# Patient Record
Sex: Female | Born: 1935 | Race: White | Hispanic: No | Smoking: Never smoker
Health system: Southern US, Community
[De-identification: ages and names within clinical notes are randomized; demographics above are authoritative.]

## PROBLEM LIST (undated history)

## (undated) DIAGNOSIS — E119 Type 2 diabetes mellitus without complications: Secondary | ICD-10-CM

## (undated) DIAGNOSIS — M810 Age-related osteoporosis without current pathological fracture: Secondary | ICD-10-CM

## (undated) DIAGNOSIS — R251 Tremor, unspecified: Secondary | ICD-10-CM

## (undated) DIAGNOSIS — I639 Cerebral infarction, unspecified: Secondary | ICD-10-CM

## (undated) DIAGNOSIS — R079 Chest pain, unspecified: Secondary | ICD-10-CM

## (undated) DIAGNOSIS — G2581 Restless legs syndrome: Secondary | ICD-10-CM

## (undated) DIAGNOSIS — S72009A Fracture of unspecified part of neck of unspecified femur, initial encounter for closed fracture: Secondary | ICD-10-CM

## (undated) DIAGNOSIS — I10 Essential (primary) hypertension: Secondary | ICD-10-CM

## (undated) DIAGNOSIS — D62 Acute posthemorrhagic anemia: Secondary | ICD-10-CM

## (undated) DIAGNOSIS — F32A Depression, unspecified: Secondary | ICD-10-CM

## (undated) DIAGNOSIS — F329 Major depressive disorder, single episode, unspecified: Secondary | ICD-10-CM

## (undated) DIAGNOSIS — K922 Gastrointestinal hemorrhage, unspecified: Secondary | ICD-10-CM

## (undated) DIAGNOSIS — D649 Anemia, unspecified: Secondary | ICD-10-CM

## (undated) DIAGNOSIS — G629 Polyneuropathy, unspecified: Secondary | ICD-10-CM

## (undated) HISTORY — DX: Gastrointestinal hemorrhage, unspecified: K92.2

## (undated) HISTORY — PX: OTHER SURGICAL HISTORY: SHX169

## (undated) HISTORY — DX: Acute posthemorrhagic anemia: D62

## (undated) HISTORY — DX: Anemia, unspecified: D64.9

## (undated) HISTORY — PX: ABDOMINAL HYSTERECTOMY: SHX81

## (undated) HISTORY — PX: LEG SURGERY: SHX1003

## (undated) HISTORY — DX: Chest pain, unspecified: R07.9

## (undated) SURGERY — Surgical Case
Anesthesia: *Unknown

---

## 2001-05-21 ENCOUNTER — Other Ambulatory Visit: Admission: RE | Admit: 2001-05-21 | Discharge: 2001-05-21 | Payer: Self-pay | Admitting: Family Medicine

## 2001-05-30 ENCOUNTER — Ambulatory Visit (HOSPITAL_COMMUNITY): Admission: RE | Admit: 2001-05-30 | Discharge: 2001-05-30 | Payer: Self-pay | Admitting: Family Medicine

## 2001-05-30 ENCOUNTER — Encounter: Payer: Self-pay | Admitting: Family Medicine

## 2001-05-30 ENCOUNTER — Encounter: Admission: RE | Admit: 2001-05-30 | Discharge: 2001-05-30 | Payer: Self-pay | Admitting: Family Medicine

## 2001-06-10 ENCOUNTER — Encounter: Admission: RE | Admit: 2001-06-10 | Discharge: 2001-06-10 | Payer: Self-pay | Admitting: Family Medicine

## 2001-06-10 ENCOUNTER — Encounter: Payer: Self-pay | Admitting: Family Medicine

## 2005-10-09 ENCOUNTER — Ambulatory Visit: Payer: Self-pay | Admitting: Physician Assistant

## 2006-10-29 ENCOUNTER — Ambulatory Visit: Payer: Self-pay | Admitting: Physician Assistant

## 2008-04-13 ENCOUNTER — Ambulatory Visit: Payer: Self-pay | Admitting: Family Medicine

## 2008-09-18 ENCOUNTER — Emergency Department: Payer: Self-pay | Admitting: Unknown Physician Specialty

## 2009-09-27 ENCOUNTER — Ambulatory Visit: Payer: Self-pay | Admitting: Physician Assistant

## 2010-10-03 ENCOUNTER — Ambulatory Visit: Payer: Self-pay | Admitting: Physician Assistant

## 2011-08-22 ENCOUNTER — Emergency Department: Payer: Self-pay | Admitting: Emergency Medicine

## 2011-11-15 ENCOUNTER — Ambulatory Visit: Payer: Self-pay | Admitting: Physician Assistant

## 2012-06-27 ENCOUNTER — Inpatient Hospital Stay: Payer: Self-pay | Admitting: Specialist

## 2012-06-27 ENCOUNTER — Ambulatory Visit: Payer: Self-pay | Admitting: Orthopedic Surgery

## 2012-06-27 LAB — CBC WITH DIFFERENTIAL/PLATELET
Basophil #: 0.3 10*3/uL — ABNORMAL HIGH (ref 0.0–0.1)
Basophil %: 4 %
Eosinophil #: 0.1 10*3/uL (ref 0.0–0.7)
Eosinophil %: 1.9 %
HCT: 39 % (ref 35.0–47.0)
HGB: 13.6 g/dL (ref 12.0–16.0)
Lymphocyte #: 1.4 10*3/uL (ref 1.0–3.6)
Lymphocyte %: 19.4 %
MCH: 31.3 pg (ref 26.0–34.0)
MCHC: 34.8 g/dL (ref 32.0–36.0)
MCV: 90 fL (ref 80–100)
Monocyte #: 0.4 x10 3/mm (ref 0.2–0.9)
Monocyte %: 5.4 %
Neutrophil #: 4.9 10*3/uL (ref 1.4–6.5)
Neutrophil %: 69.3 %
Platelet: 204 10*3/uL (ref 150–440)
RBC: 4.34 10*6/uL (ref 3.80–5.20)
RDW: 13.4 % (ref 11.5–14.5)
WBC: 7 10*3/uL (ref 3.6–11.0)

## 2012-06-27 LAB — COMPREHENSIVE METABOLIC PANEL
Albumin: 4.3 g/dL (ref 3.4–5.0)
Alkaline Phosphatase: 100 U/L (ref 50–136)
Anion Gap: 5 — ABNORMAL LOW (ref 7–16)
BUN: 17 mg/dL (ref 7–18)
Bilirubin,Total: 0.5 mg/dL (ref 0.2–1.0)
Calcium, Total: 9.5 mg/dL (ref 8.5–10.1)
Chloride: 105 mmol/L (ref 98–107)
Co2: 28 mmol/L (ref 21–32)
Creatinine: 0.88 mg/dL (ref 0.60–1.30)
EGFR (African American): >60
EGFR (Non-African Amer.): >60
Glucose: 153 mg/dL — ABNORMAL HIGH (ref 65–99)
Osmolality: 280 (ref 275–301)
Potassium: 4.6 mmol/L (ref 3.5–5.1)
SGOT(AST): 28 U/L (ref 15–37)
SGPT (ALT): 16 U/L (ref 12–78)
Sodium: 138 mmol/L (ref 136–145)
Total Protein: 8.1 g/dL (ref 6.4–8.2)

## 2012-06-27 LAB — PROTIME-INR
INR: 1
Prothrombin Time: 13.8 secs (ref 11.5–14.7)

## 2012-06-27 LAB — APTT: Activated PTT: <23 secs — ABNORMAL LOW (ref 23.6–35.9)

## 2012-07-01 ENCOUNTER — Encounter: Payer: Self-pay | Admitting: Internal Medicine

## 2013-05-30 ENCOUNTER — Emergency Department: Payer: Self-pay | Admitting: Emergency Medicine

## 2013-05-30 LAB — COMPREHENSIVE METABOLIC PANEL
Albumin: 4.2 g/dL (ref 3.4–5.0)
Alkaline Phosphatase: 89 U/L (ref 50–136)
Anion Gap: 10 (ref 7–16)
BUN: 14 mg/dL (ref 7–18)
Bilirubin,Total: 0.9 mg/dL (ref 0.2–1.0)
Calcium, Total: 10.2 mg/dL — ABNORMAL HIGH (ref 8.5–10.1)
Chloride: 103 mmol/L (ref 98–107)
Co2: 21 mmol/L (ref 21–32)
Creatinine: 1.23 mg/dL (ref 0.60–1.30)
EGFR (African American): 47 — ABNORMAL LOW
EGFR (Non-African Amer.): 41 — ABNORMAL LOW
Glucose: 104 mg/dL — ABNORMAL HIGH (ref 65–99)
Osmolality: 269 (ref 275–301)
Potassium: 3.8 mmol/L (ref 3.5–5.1)
SGOT(AST): 27 U/L (ref 15–37)
SGPT (ALT): 18 U/L (ref 12–78)
Sodium: 134 mmol/L — ABNORMAL LOW (ref 136–145)
Total Protein: 8.1 g/dL (ref 6.4–8.2)

## 2013-05-30 LAB — CBC
HCT: 41 % (ref 35.0–47.0)
HGB: 14.6 g/dL (ref 12.0–16.0)
MCH: 31.5 pg (ref 26.0–34.0)
MCHC: 35.5 g/dL (ref 32.0–36.0)
MCV: 89 fL (ref 80–100)
Platelet: 230 10*3/uL (ref 150–440)
RBC: 4.62 10*6/uL (ref 3.80–5.20)
RDW: 13.4 % (ref 11.5–14.5)
WBC: 7.3 10*3/uL (ref 3.6–11.0)

## 2013-05-30 LAB — URINALYSIS, COMPLETE
Bilirubin,UR: NEGATIVE
Blood: NEGATIVE
Glucose,UR: NEGATIVE mg/dL (ref 0–75)
Ketone: NEGATIVE
Nitrite: NEGATIVE
Ph: 7 (ref 4.5–8.0)
Protein: 30
RBC,UR: 11 /HPF (ref 0–5)
Specific Gravity: 1.016 (ref 1.003–1.030)
Squamous Epithelial: 6
WBC UR: 8 /HPF (ref 0–5)

## 2013-05-30 LAB — TROPONIN I: Troponin-I: <0.02 ng/mL

## 2013-06-01 ENCOUNTER — Emergency Department: Payer: Self-pay | Admitting: Emergency Medicine

## 2013-06-01 LAB — COMPREHENSIVE METABOLIC PANEL
Alkaline Phosphatase: 98 U/L (ref 50–136)
Bilirubin,Total: 1.3 mg/dL — ABNORMAL HIGH (ref 0.2–1.0)
Chloride: 102 mmol/L (ref 98–107)
Co2: 21 mmol/L (ref 21–32)
Creatinine: 1.33 mg/dL — ABNORMAL HIGH (ref 0.60–1.30)
EGFR (Non-African Amer.): 37 — ABNORMAL LOW
Glucose: 200 mg/dL — ABNORMAL HIGH (ref 65–99)
Osmolality: 277 (ref 275–301)
Potassium: 3.7 mmol/L (ref 3.5–5.1)
SGOT(AST): 119 U/L — ABNORMAL HIGH (ref 15–37)
SGPT (ALT): 45 U/L (ref 12–78)
Sodium: 133 mmol/L — ABNORMAL LOW (ref 136–145)

## 2013-06-01 LAB — CBC
HCT: 43.1 % (ref 35.0–47.0)
MCH: 31.1 pg (ref 26.0–34.0)
MCHC: 35.3 g/dL (ref 32.0–36.0)
MCV: 88 fL (ref 80–100)
RDW: 13.7 % (ref 11.5–14.5)
WBC: 9 10*3/uL (ref 3.6–11.0)

## 2013-06-01 LAB — URINALYSIS, COMPLETE
Bilirubin,UR: NEGATIVE
Glucose,UR: NEGATIVE mg/dL (ref 0–75)
Nitrite: NEGATIVE
Ph: 5 (ref 4.5–8.0)
Protein: 100
RBC,UR: 6 /HPF (ref 0–5)
Squamous Epithelial: 17

## 2013-06-01 LAB — URINE CULTURE

## 2014-09-08 DIAGNOSIS — I69359 Hemiplegia and hemiparesis following cerebral infarction affecting unspecified side: Secondary | ICD-10-CM | POA: Diagnosis not present

## 2014-09-08 DIAGNOSIS — F329 Major depressive disorder, single episode, unspecified: Secondary | ICD-10-CM | POA: Diagnosis not present

## 2014-09-08 DIAGNOSIS — R5383 Other fatigue: Secondary | ICD-10-CM | POA: Diagnosis not present

## 2014-09-08 DIAGNOSIS — E119 Type 2 diabetes mellitus without complications: Secondary | ICD-10-CM | POA: Diagnosis not present

## 2014-09-08 DIAGNOSIS — I10 Essential (primary) hypertension: Secondary | ICD-10-CM | POA: Diagnosis not present

## 2014-09-08 DIAGNOSIS — M79604 Pain in right leg: Secondary | ICD-10-CM | POA: Diagnosis not present

## 2014-09-08 DIAGNOSIS — G629 Polyneuropathy, unspecified: Secondary | ICD-10-CM | POA: Diagnosis not present

## 2014-09-08 DIAGNOSIS — E538 Deficiency of other specified B group vitamins: Secondary | ICD-10-CM | POA: Diagnosis not present

## 2014-09-08 DIAGNOSIS — G4700 Insomnia, unspecified: Secondary | ICD-10-CM | POA: Diagnosis not present

## 2014-09-15 DIAGNOSIS — M79609 Pain in unspecified limb: Secondary | ICD-10-CM | POA: Diagnosis not present

## 2014-09-21 DIAGNOSIS — N390 Urinary tract infection, site not specified: Secondary | ICD-10-CM | POA: Diagnosis not present

## 2014-09-21 DIAGNOSIS — M79604 Pain in right leg: Secondary | ICD-10-CM | POA: Diagnosis not present

## 2014-09-21 DIAGNOSIS — E119 Type 2 diabetes mellitus without complications: Secondary | ICD-10-CM | POA: Diagnosis not present

## 2014-09-21 DIAGNOSIS — G629 Polyneuropathy, unspecified: Secondary | ICD-10-CM | POA: Diagnosis not present

## 2014-09-21 DIAGNOSIS — Z9181 History of falling: Secondary | ICD-10-CM | POA: Diagnosis not present

## 2014-09-21 DIAGNOSIS — Z1389 Encounter for screening for other disorder: Secondary | ICD-10-CM | POA: Diagnosis not present

## 2014-09-21 DIAGNOSIS — F329 Major depressive disorder, single episode, unspecified: Secondary | ICD-10-CM | POA: Diagnosis not present

## 2014-10-20 DIAGNOSIS — G629 Polyneuropathy, unspecified: Secondary | ICD-10-CM | POA: Diagnosis not present

## 2014-10-20 DIAGNOSIS — F329 Major depressive disorder, single episode, unspecified: Secondary | ICD-10-CM | POA: Diagnosis not present

## 2014-10-20 DIAGNOSIS — M8580 Other specified disorders of bone density and structure, unspecified site: Secondary | ICD-10-CM | POA: Diagnosis not present

## 2014-10-20 DIAGNOSIS — H6120 Impacted cerumen, unspecified ear: Secondary | ICD-10-CM | POA: Diagnosis not present

## 2014-10-20 DIAGNOSIS — I739 Peripheral vascular disease, unspecified: Secondary | ICD-10-CM | POA: Diagnosis not present

## 2014-10-20 DIAGNOSIS — Z6822 Body mass index (BMI) 22.0-22.9, adult: Secondary | ICD-10-CM | POA: Diagnosis not present

## 2014-11-01 DIAGNOSIS — I639 Cerebral infarction, unspecified: Secondary | ICD-10-CM | POA: Diagnosis not present

## 2014-11-01 DIAGNOSIS — I10 Essential (primary) hypertension: Secondary | ICD-10-CM | POA: Diagnosis not present

## 2014-11-01 DIAGNOSIS — I70212 Atherosclerosis of native arteries of extremities with intermittent claudication, left leg: Secondary | ICD-10-CM | POA: Diagnosis not present

## 2014-11-01 DIAGNOSIS — E785 Hyperlipidemia, unspecified: Secondary | ICD-10-CM | POA: Diagnosis not present

## 2014-11-01 DIAGNOSIS — M79609 Pain in unspecified limb: Secondary | ICD-10-CM | POA: Diagnosis not present

## 2014-11-01 DIAGNOSIS — E119 Type 2 diabetes mellitus without complications: Secondary | ICD-10-CM | POA: Diagnosis not present

## 2014-11-03 ENCOUNTER — Ambulatory Visit
Admit: 2014-11-03 | Disposition: A | Discharge status: Home / Self Care | Payer: Self-pay | Attending: Physician Assistant | Admitting: Physician Assistant

## 2014-11-03 DIAGNOSIS — M810 Age-related osteoporosis without current pathological fracture: Secondary | ICD-10-CM | POA: Diagnosis not present

## 2014-11-04 DIAGNOSIS — I639 Cerebral infarction, unspecified: Secondary | ICD-10-CM | POA: Diagnosis not present

## 2014-11-04 DIAGNOSIS — I771 Stricture of artery: Secondary | ICD-10-CM | POA: Diagnosis not present

## 2014-11-04 DIAGNOSIS — I70221 Atherosclerosis of native arteries of extremities with rest pain, right leg: Secondary | ICD-10-CM | POA: Diagnosis not present

## 2014-11-04 DIAGNOSIS — E119 Type 2 diabetes mellitus without complications: Secondary | ICD-10-CM | POA: Diagnosis not present

## 2014-11-04 DIAGNOSIS — E785 Hyperlipidemia, unspecified: Secondary | ICD-10-CM | POA: Diagnosis not present

## 2014-11-04 DIAGNOSIS — M79609 Pain in unspecified limb: Secondary | ICD-10-CM | POA: Diagnosis not present

## 2014-11-04 DIAGNOSIS — I70212 Atherosclerosis of native arteries of extremities with intermittent claudication, left leg: Secondary | ICD-10-CM | POA: Diagnosis not present

## 2014-11-04 DIAGNOSIS — I10 Essential (primary) hypertension: Secondary | ICD-10-CM | POA: Diagnosis not present
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
## 2015-01-26 DIAGNOSIS — Z9181 History of falling: Secondary | ICD-10-CM | POA: Diagnosis not present

## 2015-01-26 DIAGNOSIS — E119 Type 2 diabetes mellitus without complications: Secondary | ICD-10-CM | POA: Diagnosis not present

## 2015-01-26 DIAGNOSIS — I69359 Hemiplegia and hemiparesis following cerebral infarction affecting unspecified side: Secondary | ICD-10-CM | POA: Diagnosis not present

## 2015-01-26 DIAGNOSIS — Z79899 Other long term (current) drug therapy: Secondary | ICD-10-CM | POA: Diagnosis not present

## 2015-01-26 DIAGNOSIS — Z139 Encounter for screening, unspecified: Secondary | ICD-10-CM | POA: Diagnosis not present

## 2015-01-26 DIAGNOSIS — G250 Essential tremor: Secondary | ICD-10-CM | POA: Diagnosis not present

## 2015-01-26 DIAGNOSIS — M79604 Pain in right leg: Secondary | ICD-10-CM | POA: Diagnosis not present

## 2015-01-26 DIAGNOSIS — E780 Pure hypercholesterolemia: Secondary | ICD-10-CM | POA: Diagnosis not present

## 2015-01-26 DIAGNOSIS — Z6822 Body mass index (BMI) 22.0-22.9, adult: Secondary | ICD-10-CM | POA: Diagnosis not present

## 2015-04-28 DIAGNOSIS — I10 Essential (primary) hypertension: Secondary | ICD-10-CM | POA: Diagnosis not present

## 2015-04-28 DIAGNOSIS — G2581 Restless legs syndrome: Secondary | ICD-10-CM | POA: Diagnosis not present

## 2015-04-28 DIAGNOSIS — M79604 Pain in right leg: Secondary | ICD-10-CM | POA: Diagnosis not present

## 2015-04-28 DIAGNOSIS — G4700 Insomnia, unspecified: Secondary | ICD-10-CM | POA: Diagnosis not present

## 2015-04-28 DIAGNOSIS — R413 Other amnesia: Secondary | ICD-10-CM | POA: Diagnosis not present

## 2015-04-28 DIAGNOSIS — E119 Type 2 diabetes mellitus without complications: Secondary | ICD-10-CM | POA: Diagnosis not present

## 2015-04-28 DIAGNOSIS — G250 Essential tremor: Secondary | ICD-10-CM | POA: Diagnosis not present

## 2015-08-02 DIAGNOSIS — I10 Essential (primary) hypertension: Secondary | ICD-10-CM | POA: Diagnosis not present

## 2015-08-02 DIAGNOSIS — Z23 Encounter for immunization: Secondary | ICD-10-CM | POA: Diagnosis not present

## 2015-08-02 DIAGNOSIS — I69359 Hemiplegia and hemiparesis following cerebral infarction affecting unspecified side: Secondary | ICD-10-CM | POA: Diagnosis not present

## 2015-08-02 DIAGNOSIS — M79604 Pain in right leg: Secondary | ICD-10-CM | POA: Diagnosis not present

## 2015-08-02 DIAGNOSIS — E1165 Type 2 diabetes mellitus with hyperglycemia: Secondary | ICD-10-CM | POA: Diagnosis not present

## 2015-08-02 DIAGNOSIS — G250 Essential tremor: Secondary | ICD-10-CM | POA: Diagnosis not present

## 2015-08-02 DIAGNOSIS — Z6822 Body mass index (BMI) 22.0-22.9, adult: Secondary | ICD-10-CM | POA: Diagnosis not present

## 2015-09-20 DIAGNOSIS — G250 Essential tremor: Secondary | ICD-10-CM | POA: Diagnosis not present

## 2015-10-20 ENCOUNTER — Inpatient Hospital Stay
Admission: EM | Admit: 2015-10-20 | Discharge: 2015-10-24 | DRG: 481 | Disposition: A | Discharge status: Skilled Nursing Facility | Payer: Commercial Managed Care - HMO | Attending: Specialist | Admitting: Specialist

## 2015-10-20 ENCOUNTER — Encounter: Payer: Self-pay | Admitting: Emergency Medicine

## 2015-10-20 ENCOUNTER — Emergency Department: Payer: Commercial Managed Care - HMO

## 2015-10-20 DIAGNOSIS — R410 Disorientation, unspecified: Secondary | ICD-10-CM | POA: Diagnosis not present

## 2015-10-20 DIAGNOSIS — Z7983 Long term (current) use of bisphosphonates: Secondary | ICD-10-CM | POA: Diagnosis not present

## 2015-10-20 DIAGNOSIS — Z4789 Encounter for other orthopedic aftercare: Secondary | ICD-10-CM | POA: Diagnosis not present

## 2015-10-20 DIAGNOSIS — Z8673 Personal history of transient ischemic attack (TIA), and cerebral infarction without residual deficits: Secondary | ICD-10-CM

## 2015-10-20 DIAGNOSIS — Z9181 History of falling: Secondary | ICD-10-CM | POA: Diagnosis not present

## 2015-10-20 DIAGNOSIS — N179 Acute kidney failure, unspecified: Secondary | ICD-10-CM | POA: Diagnosis not present

## 2015-10-20 DIAGNOSIS — G629 Polyneuropathy, unspecified: Secondary | ICD-10-CM | POA: Diagnosis present

## 2015-10-20 DIAGNOSIS — Z7984 Long term (current) use of oral hypoglycemic drugs: Secondary | ICD-10-CM | POA: Diagnosis not present

## 2015-10-20 DIAGNOSIS — T402X5A Adverse effect of other opioids, initial encounter: Secondary | ICD-10-CM | POA: Diagnosis not present

## 2015-10-20 DIAGNOSIS — S72145D Nondisplaced intertrochanteric fracture of left femur, subsequent encounter for closed fracture with routine healing: Secondary | ICD-10-CM | POA: Diagnosis not present

## 2015-10-20 DIAGNOSIS — M6281 Muscle weakness (generalized): Secondary | ICD-10-CM | POA: Diagnosis not present

## 2015-10-20 DIAGNOSIS — W06XXXA Fall from bed, initial encounter: Secondary | ICD-10-CM | POA: Diagnosis present

## 2015-10-20 DIAGNOSIS — S72142A Displaced intertrochanteric fracture of left femur, initial encounter for closed fracture: Principal | ICD-10-CM

## 2015-10-20 DIAGNOSIS — Z79899 Other long term (current) drug therapy: Secondary | ICD-10-CM

## 2015-10-20 DIAGNOSIS — Y92003 Bedroom of unspecified non-institutional (private) residence as the place of occurrence of the external cause: Secondary | ICD-10-CM | POA: Diagnosis not present

## 2015-10-20 DIAGNOSIS — E1122 Type 2 diabetes mellitus with diabetic chronic kidney disease: Secondary | ICD-10-CM | POA: Diagnosis not present

## 2015-10-20 DIAGNOSIS — G2581 Restless legs syndrome: Secondary | ICD-10-CM | POA: Diagnosis present

## 2015-10-20 DIAGNOSIS — N189 Chronic kidney disease, unspecified: Secondary | ICD-10-CM | POA: Diagnosis not present

## 2015-10-20 DIAGNOSIS — D649 Anemia, unspecified: Secondary | ICD-10-CM | POA: Diagnosis not present

## 2015-10-20 DIAGNOSIS — E871 Hypo-osmolality and hyponatremia: Secondary | ICD-10-CM | POA: Diagnosis not present

## 2015-10-20 DIAGNOSIS — Y92230 Patient room in hospital as the place of occurrence of the external cause: Secondary | ICD-10-CM | POA: Diagnosis not present

## 2015-10-20 DIAGNOSIS — Z96642 Presence of left artificial hip joint: Secondary | ICD-10-CM | POA: Diagnosis present

## 2015-10-20 DIAGNOSIS — Z419 Encounter for procedure for purposes other than remedying health state, unspecified: Secondary | ICD-10-CM

## 2015-10-20 DIAGNOSIS — I129 Hypertensive chronic kidney disease with stage 1 through stage 4 chronic kidney disease, or unspecified chronic kidney disease: Secondary | ICD-10-CM | POA: Diagnosis present

## 2015-10-20 DIAGNOSIS — F329 Major depressive disorder, single episode, unspecified: Secondary | ICD-10-CM | POA: Diagnosis present

## 2015-10-20 DIAGNOSIS — T424X5A Adverse effect of benzodiazepines, initial encounter: Secondary | ICD-10-CM | POA: Diagnosis not present

## 2015-10-20 DIAGNOSIS — R262 Difficulty in walking, not elsewhere classified: Secondary | ICD-10-CM | POA: Diagnosis not present

## 2015-10-20 DIAGNOSIS — R4182 Altered mental status, unspecified: Secondary | ICD-10-CM | POA: Diagnosis not present

## 2015-10-20 DIAGNOSIS — S72002A Fracture of unspecified part of neck of left femur, initial encounter for closed fracture: Secondary | ICD-10-CM | POA: Diagnosis not present

## 2015-10-20 DIAGNOSIS — I10 Essential (primary) hypertension: Secondary | ICD-10-CM | POA: Diagnosis not present

## 2015-10-20 DIAGNOSIS — R278 Other lack of coordination: Secondary | ICD-10-CM | POA: Diagnosis not present

## 2015-10-20 DIAGNOSIS — R000 Tachycardia, unspecified: Secondary | ICD-10-CM

## 2015-10-20 DIAGNOSIS — E119 Type 2 diabetes mellitus without complications: Secondary | ICD-10-CM | POA: Diagnosis not present

## 2015-10-20 DIAGNOSIS — Z01818 Encounter for other preprocedural examination: Secondary | ICD-10-CM | POA: Diagnosis not present

## 2015-10-20 DIAGNOSIS — M810 Age-related osteoporosis without current pathological fracture: Secondary | ICD-10-CM | POA: Diagnosis present

## 2015-10-20 DIAGNOSIS — E1140 Type 2 diabetes mellitus with diabetic neuropathy, unspecified: Secondary | ICD-10-CM | POA: Diagnosis present

## 2015-10-20 DIAGNOSIS — G250 Essential tremor: Secondary | ICD-10-CM | POA: Diagnosis present

## 2015-10-20 HISTORY — DX: Restless legs syndrome: G25.81

## 2015-10-20 HISTORY — DX: Cerebral infarction, unspecified: I63.9

## 2015-10-20 HISTORY — DX: Tremor, unspecified: R25.1

## 2015-10-20 HISTORY — DX: Polyneuropathy, unspecified: G62.9

## 2015-10-20 HISTORY — DX: Age-related osteoporosis without current pathological fracture: M81.0

## 2015-10-20 HISTORY — DX: Major depressive disorder, single episode, unspecified: F32.9

## 2015-10-20 HISTORY — DX: Anemia, unspecified: D64.9

## 2015-10-20 HISTORY — DX: Depression, unspecified: F32.A

## 2015-10-20 HISTORY — DX: Essential (primary) hypertension: I10

## 2015-10-20 HISTORY — DX: Type 2 diabetes mellitus without complications: E11.9

## 2015-10-20 LAB — CBC WITH DIFFERENTIAL/PLATELET
BASOS ABS: 0 10*3/uL (ref 0–0.1)
Basophils Relative: 0 %
EOS ABS: 0 10*3/uL (ref 0–0.7)
EOS PCT: 0 %
HCT: 34.4 % — ABNORMAL LOW (ref 35.0–47.0)
HEMOGLOBIN: 11.8 g/dL — AB (ref 12.0–16.0)
LYMPHS ABS: 1.6 10*3/uL (ref 1.0–3.6)
Lymphocytes Relative: 14 %
MCH: 30.1 pg (ref 26.0–34.0)
MCHC: 34.3 g/dL (ref 32.0–36.0)
MCV: 87.6 fL (ref 80.0–100.0)
Monocytes Absolute: 0.9 10*3/uL (ref 0.2–0.9)
Monocytes Relative: 8 %
NEUTROS PCT: 78 %
Neutro Abs: 8.3 10*3/uL — ABNORMAL HIGH (ref 1.4–6.5)
PLATELETS: 269 10*3/uL (ref 150–440)
RBC: 3.93 MIL/uL (ref 3.80–5.20)
RDW: 14.3 % (ref 11.5–14.5)
WBC: 10.9 10*3/uL (ref 3.6–11.0)

## 2015-10-20 LAB — COMPREHENSIVE METABOLIC PANEL
ALT: 14 U/L (ref 14–54)
AST: 23 U/L (ref 15–41)
Albumin: 4 g/dL (ref 3.5–5.0)
Alkaline Phosphatase: 71 U/L (ref 38–126)
Anion gap: 10 (ref 5–15)
BILIRUBIN TOTAL: 1 mg/dL (ref 0.3–1.2)
BUN: 24 mg/dL — AB (ref 6–20)
CHLORIDE: 98 mmol/L — AB (ref 101–111)
CO2: 26 mmol/L (ref 22–32)
CREATININE: 1.37 mg/dL — AB (ref 0.44–1.00)
Calcium: 9.6 mg/dL (ref 8.9–10.3)
GFR, EST AFRICAN AMERICAN: 40 mL/min — AB (ref >60)
GFR, EST NON AFRICAN AMERICAN: 34 mL/min — AB (ref >60)
Glucose, Bld: 265 mg/dL — ABNORMAL HIGH (ref 65–99)
POTASSIUM: 5 mmol/L (ref 3.5–5.1)
Sodium: 134 mmol/L — ABNORMAL LOW (ref 135–145)
TOTAL PROTEIN: 7.6 g/dL (ref 6.5–8.1)

## 2015-10-20 LAB — TYPE AND SCREEN
ABO/RH(D): O POS
ANTIBODY SCREEN: NEGATIVE

## 2015-10-20 LAB — PROTIME-INR
INR: 1.23
PROTHROMBIN TIME: 15.7 s — AB (ref 11.4–15.0)

## 2015-10-20 LAB — ABO/RH: ABO/RH(D): O POS

## 2015-10-20 LAB — APTT: APTT: 30 s (ref 24–36)

## 2015-10-20 MED ORDER — SODIUM CHLORIDE 0.9 % IV SOLN
INTRAVENOUS | Status: DC
  Administered 2015-10-20 – 2015-10-21 (×2): via INTRAVENOUS

## 2015-10-20 MED ORDER — SODIUM CHLORIDE 0.9 % IV SOLN
INTRAVENOUS | Status: DC
  Administered 2015-10-20 – 2015-10-21 (×2): via INTRAVENOUS

## 2015-10-20 MED ORDER — GABAPENTIN 600 MG PO TABS
600.0000 mg | ORAL_TABLET | Freq: Three times a day (TID) | ORAL | Status: DC
  Administered 2015-10-20 – 2015-10-24 (×10): 600 mg via ORAL
  Filled 2015-10-20 (×10): qty 1

## 2015-10-20 MED ORDER — DULOXETINE HCL 60 MG PO CPEP
60.0000 mg | ORAL_CAPSULE | Freq: Every day | ORAL | Status: DC
  Administered 2015-10-20 – 2015-10-24 (×5): 60 mg via ORAL
  Filled 2015-10-20 (×5): qty 1

## 2015-10-20 MED ORDER — PRAVASTATIN SODIUM 20 MG PO TABS
40.0000 mg | ORAL_TABLET | Freq: Every day | ORAL | Status: DC
  Administered 2015-10-20 – 2015-10-23 (×4): 40 mg via ORAL
  Filled 2015-10-20 (×4): qty 2

## 2015-10-20 MED ORDER — TRAZODONE HCL 100 MG PO TABS
100.0000 mg | ORAL_TABLET | Freq: Every evening | ORAL | Status: DC | PRN
  Administered 2015-10-22 – 2015-10-23 (×2): 200 mg via ORAL
  Filled 2015-10-20 (×2): qty 2

## 2015-10-20 MED ORDER — DOCUSATE SODIUM 100 MG PO CAPS
100.0000 mg | ORAL_CAPSULE | Freq: Two times a day (BID) | ORAL | Status: DC
  Administered 2015-10-20 – 2015-10-24 (×8): 100 mg via ORAL
  Filled 2015-10-20 (×8): qty 1

## 2015-10-20 MED ORDER — MORPHINE SULFATE (PF) 4 MG/ML IV SOLN
4.0000 mg | INTRAVENOUS | Status: DC | PRN
  Administered 2015-10-20: 4 mg via INTRAVENOUS
  Filled 2015-10-20 (×2): qty 1

## 2015-10-20 MED ORDER — VITAMIN D 1000 UNITS PO TABS
1000.0000 [IU] | ORAL_TABLET | Freq: Every day | ORAL | Status: DC
  Administered 2015-10-20 – 2015-10-24 (×5): 1000 [IU] via ORAL
  Filled 2015-10-20 (×5): qty 1

## 2015-10-20 MED ORDER — SODIUM CHLORIDE 0.9% FLUSH
3.0000 mL | Freq: Two times a day (BID) | INTRAVENOUS | Status: DC
  Administered 2015-10-21 – 2015-10-24 (×5): 3 mL via INTRAVENOUS

## 2015-10-20 MED ORDER — CEFAZOLIN SODIUM 1-5 GM-% IV SOLN
1.0000 g | Freq: Once | INTRAVENOUS | Status: AC
  Administered 2015-10-21: 1 g via INTRAVENOUS
  Filled 2015-10-20: qty 50

## 2015-10-20 MED ORDER — ENOXAPARIN SODIUM 30 MG/0.3ML ~~LOC~~ SOLN
30.0000 mg | SUBCUTANEOUS | Status: DC
  Filled 2015-10-20: qty 0.3

## 2015-10-20 MED ORDER — ACETAMINOPHEN 650 MG RE SUPP
650.0000 mg | Freq: Four times a day (QID) | RECTAL | Status: DC | PRN
Start: 2015-10-20 — End: 2015-10-24

## 2015-10-20 MED ORDER — MORPHINE SULFATE (PF) 4 MG/ML IV SOLN
4.0000 mg | INTRAVENOUS | Status: DC | PRN
  Administered 2015-10-20 – 2015-10-22 (×3): 4 mg via INTRAVENOUS
  Filled 2015-10-20 (×2): qty 1

## 2015-10-20 MED ORDER — PROPRANOLOL HCL 20 MG PO TABS
80.0000 mg | ORAL_TABLET | Freq: Every day | ORAL | Status: DC
  Administered 2015-10-20 – 2015-10-23 (×4): 80 mg via ORAL
  Filled 2015-10-20 (×4): qty 4

## 2015-10-20 MED ORDER — HYDROCODONE-ACETAMINOPHEN 5-325 MG PO TABS
1.0000 | ORAL_TABLET | ORAL | Status: DC | PRN
  Administered 2015-10-20 – 2015-10-21 (×2): 2 via ORAL
  Administered 2015-10-22: 1 via ORAL
  Filled 2015-10-20: qty 1
  Filled 2015-10-20 (×3): qty 2

## 2015-10-20 MED ORDER — ACETAMINOPHEN 325 MG PO TABS
650.0000 mg | ORAL_TABLET | Freq: Four times a day (QID) | ORAL | Status: DC | PRN
  Administered 2015-10-22 – 2015-10-24 (×4): 650 mg via ORAL
  Filled 2015-10-20 (×4): qty 2

## 2015-10-20 MED ORDER — SENNA 8.6 MG PO TABS
1.0000 | ORAL_TABLET | Freq: Two times a day (BID) | ORAL | Status: DC
  Administered 2015-10-20 – 2015-10-24 (×8): 8.6 mg via ORAL
  Filled 2015-10-20 (×8): qty 1

## 2015-10-20 MED ORDER — SODIUM CHLORIDE 0.9 % IV SOLN: INTRAVENOUS | Status: DC
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
## 2015-10-20 NOTE — ED Notes (Signed)
This RN called for report, nursing receiving will call back in 10 minutes
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
## 2015-10-20 NOTE — ED Provider Notes (Signed)
[facility] Emergency Department Provider Note  ____________________________________________  Time seen: Approximately 4:45 PM  I have reviewed the triage vital signs and the nursing notes.   HISTORY  Chief Complaint Fall and Hip Pain    HPI Carrie Bishop is a 82 y.o. female presents by private vehicle for evaluation of severe pain in her left hip.  She reports that she slid off of her bed this morning and did not have much pain at the time.  However it has gradually worsened over time and is currently severe, sharp, and much worse with any kind of movement.  It is located in the upper part of her left hip/femur.  She has no numbness nor tingling in her extremity.  She is not currently externally rotated or shortened but she is holding her leg very still and has severe tenderness with any movement.  She did not strike her head, did not lose consciousness, andsustained no other injuries.  She cannot bear any weight on the leg.   Past Medical History  Diagnosis Date  . Diabetes mellitus without complication ([place])   . Restless leg syndrome   . Neuropathy ([place])   . Depression   . Osteoporosis   . Tremor   . Hypertension   . Stroke [facility])     Patient Active Problem List   Diagnosis Date Noted  . Intertrochanteric fracture of left hip ([place]) 10/20/2015  . Hyponatremia 10/20/2015  . Sinus tachycardia ([place]) 10/20/2015  . Chronic renal insufficiency 10/20/2015  . Anemia 10/20/2015  . Closed left hip fracture ([place]) 10/20/2015    Past Surgical History  Procedure Laterality Date  . Leg surgery    . Arm surgery    . Abdominal hysterectomy      No current outpatient prescriptions on file.  Allergies Review of patient's allergies indicates no known allergies.  No family history on file.  Social History Social History  Substance Use Topics  . Smoking status: Never Smoker   . Smokeless tobacco: None  . Alcohol Use: No    Review of  Systems Constitutional: No fever/chills Eyes: No visual changes. ENT: No sore throat. Cardiovascular: Denies chest pain.   Respiratory: Denies shortness of breath. Gastrointestinal: No abdominal pain.  No nausea, no vomiting.  No diarrhea.  No constipation. Genitourinary: Negative for dysuria. Musculoskeletal:  Severe pain in left hip/upper thigh Negative for back pain.   Skin: Negative for rash. Neurological: Negative for headaches, focal weakness or numbness.  10-point ROS otherwise negative.  ____________________________________________   PHYSICAL EXAM:  VITAL SIGNS: ED Triage Vitals  Enc Vitals Group     BP 10/20/15 1513 138/62 mmHg     Pulse Rate 10/20/15 1513 112     Resp 10/20/15 1513 18     Temp 10/20/15 1513 97.8 F (36.6 C)     Temp Source 10/20/15 1513 Oral     SpO2 10/20/15 1513 96 %     Weight 10/20/15 1513 121 lb (54.885 kg)     Height 10/20/15 1513 5\' 6"  (1.676 m)     Head Cir --      Peak Flow --      Pain Score 10/20/15 1514 9     Pain Loc --      Pain Edu? --      Excl. in [place]? --     Constitutional: Alert and oriented. Well appearing and in no acute distress. Eyes: Conjunctivae are normal. PERRL. EOMI. Head: Atraumatic. Nose: No congestion/rhinnorhea. Mouth/Throat: Mucous  membranes are moist.  Oropharynx non-erythematous. Neck: No stridor.  No meningeal signs.   Cardiovascular: Normal rate, regular rhythm. Good peripheral circulation. Grossly normal heart sounds.   Respiratory: Normal respiratory effort.  No retractions. Lungs CTAB. Gastrointestinal: Soft and nontender. No distention.  Musculoskeletal: severe pain/tenderness in the left upper femur.  No gross deformity Neurologic:  Normal speech and language. No gross focal neurologic deficits are appreciated. Very hard of hearing. Skin:  Skin is warm, dry and intact. No rash noted. Psychiatric: Mood and affect are normal. Speech and behavior are  normal.  ____________________________________________   LABS (all labs ordered are listed, but only abnormal results are displayed)  Labs Reviewed  COMPREHENSIVE METABOLIC PANEL - Abnormal; Notable for the following:    Sodium 134 (*)    Chloride 98 (*)    Glucose, Bld 265 (*)    BUN 24 (*)    Creatinine, Ser 1.37 (*)    GFR calc non Af Amer 34 (*)    GFR calc Af Amer 40 (*)    All other components within normal limits  CBC WITH DIFFERENTIAL/PLATELET - Abnormal; Notable for the following:    Hemoglobin 11.8 (*)    HCT 34.4 (*)    Neutro Abs 8.3 (*)    All other components within normal limits  PROTIME-INR - Abnormal; Notable for the following:    Prothrombin Time 15.7 (*)    All other components within normal limits  SURGICAL PCR SCREEN  APTT  URINALYSIS COMPLETEWITH MICROSCOPIC ([facility] ONLY)  BASIC METABOLIC PANEL  CBC  TYPE AND SCREEN  ABO/RH   ____________________________________________  EKG  ED ECG REPORT I, Ginevra Tacker, the attending physician, personally viewed and interpreted this ECG.  Date: 10/20/2015 EKG Time: 17:25 Rate: 112 Rhythm: Sinus tachycardia QRS Axis: normal Intervals: normal ST/T Wave abnormalities: normal Conduction Disturbances: none Narrative Interpretation: unremarkable  ____________________________________________  RADIOLOGY   Dg Chest 1 View  10/20/2015  CLINICAL DATA:  Preop for hip fracture EXAM: CHEST 1 VIEW COMPARISON:  Eleven/1/14 FINDINGS: Cardiomediastinal silhouette is stable. No acute infiltrate or pleural effusion. No pulmonary edema. Atherosclerotic calcifications of thoracic aorta again noted. Mild elevation of the right hemidiaphragm again noted. IMPRESSION: No active disease. Electronically Signed   By: Lahoma Crocker M.D.   On: 10/20/2015 17:12   Dg Hip Unilat With Pelvis 2-3 Views Left  10/20/2015  CLINICAL DATA:  Fall, left hip pain EXAM: DG HIP (WITH OR WITHOUT PELVIS) 2-3V LEFT COMPARISON:  None. FINDINGS: Three  views of the left hip submitted. There is mild impacted with minimal angulation intertrochanteric fracture of proximal left femur. IMPRESSION: Mild impacted minimal angulated intertrochanteric fracture of proximal left femur. Electronically Signed   By: Lahoma Crocker M.D.   On: 10/20/2015 17:15    ____________________________________________   PROCEDURES  Procedure(s) performed: None  Critical Care performed: No ____________________________________________   INITIAL IMPRESSION / ASSESSMENT AND PLAN / ED COURSE  Pertinent labs & imaging results that were available during my care of the patient were reviewed by me and considered in my medical decision making (see chart for details).  Awaiting radiographs.  Anticipate hip fracture.  ----------------------------------------- 5:40 PM on 10/20/2015 -----------------------------------------  Left intertrochanteric femur fracture.  I spoke by phone with Dr. Rudene Christians who is planning on surgery tomorrow around noon.  I updated the patient and family.  I am admitting to the hospitalist.  Preop labs have been ordered, EKG done and unremarkable.  Patient's pain is well-controlled at this time.  ____________________________________________  FINAL CLINICAL IMPRESSION(S) / ED DIAGNOSES  Final diagnoses:  Fracture, intertrochanteric, left femur, closed, initial encounter ([place])      NEW MEDICATIONS STARTED DURING THIS VISIT:  Current Discharge Medication List        Note:  This document was prepared using Dragon voice recognition software and may include unintentional dictation errors.   Hinda Kehr, MD 10/20/15 [phone]

## 2015-10-20 NOTE — ED Notes (Signed)
Sandwich tray given 

## 2015-10-20 NOTE — H&P (Signed)
[place] [facility] [place] NAME: Carrie Bishop    MR#:  [id number]  DATE OF BIRTH:  February 11, 1929  DATE OF ADMISSION:  10/20/2015  PRIMARY CARE PHYSICIAN: No primary care provider on file.   REQUESTING/REFERRING PHYSICIAN:   CHIEF COMPLAINT:   Chief Complaint  Patient presents with  . Fall  . Hip Pain    HISTORY OF PRESENT ILLNESS: Carrie Bishop  is a 82 y.o. female with a known history of double medical problems including diabetes, neuropathy, hypertension, stroke, related incontinence for which she uses depends was getting prepared to go to the hospital and her left leg gave out and she landed on the side fallen. He was unable to get up. On arrival to emergency room, she was noted to have left intertrochanteric hip fracture and hospitalist services were contacted for admission. The patient's labs revealed renal insufficiency, hyponatremia, anemia. Labs showed hyponatremia, renal insufficiency, mild anemia PAST MEDICAL HISTORY:   Past Medical History  Diagnosis Date  . Diabetes mellitus without complication ([place])   . Restless leg syndrome   . Neuropathy ([place])   . Depression   . Osteoporosis   . Tremor   . Hypertension   . Stroke [facility])     PAST SURGICAL HISTORY: Past Surgical History  Procedure Laterality Date  . Leg surgery    . Arm surgery    . Abdominal hysterectomy      SOCIAL HISTORY:  Social History  Substance Use Topics  . Smoking status: Never Smoker   . Smokeless tobacco: Not on file  . Alcohol Use: No    FAMILY HISTORY: No early coronary artery disease  DRUG ALLERGIES: No Known Allergies  Review of Systems  Unable to perform ROS: acuity of condition  , difficulty hearing  MEDICATIONS AT HOME:  Prior to Admission medications   Medication Sig Start Date End Date Taking? Authorizing Provider  alendronate (FOSAMAX) 70 MG tablet Take 70 mg by mouth once a week. Take with a full glass of water on an empty  stomach. On Fridays.   Yes Historical Provider, MD  cholecalciferol (VITAMIN D) 1000 units tablet Take 1,000 Units by mouth daily.   Yes Historical Provider, MD  DULoxetine (CYMBALTA) 60 MG capsule Take 60 mg by mouth daily.    Yes Historical Provider, MD  gabapentin (NEURONTIN) 600 MG tablet Take 600 mg by mouth 3 (three) times daily.   Yes Historical Provider, MD  lisinopril (PRINIVIL,ZESTRIL) 40 MG tablet Take 1 tablet by mouth at bedtime.  09/26/15  Yes Historical Provider, MD  metFORMIN (GLUCOPHAGE) 500 MG tablet Take 1,500 mg by mouth at bedtime.   Yes Historical Provider, MD  pravastatin (PRAVACHOL) 40 MG tablet Take 40 mg by mouth at bedtime.   Yes Historical Provider, MD  propranolol (INDERAL) 80 MG tablet Take 80 mg by mouth at bedtime.   Yes Historical Provider, MD  traZODone (DESYREL) 100 MG tablet Take 100-200 mg by mouth at bedtime as needed for sleep.   Yes Historical Provider, MD      PHYSICAL EXAMINATION:   VITAL SIGNS: Blood pressure 138/62, pulse 112, temperature 97.8 F (36.6 C), temperature source Oral, resp. rate 18, height 5\' 6"  (1.676 m), weight 54.885 kg (121 lb), SpO2 96 %.  GENERAL:  82 y.o.-year-old patient lying in the bed in moderate distress due to severe pain. She has upper and lower extremity shaking, tremor  EYES: Pupils equal, round, reactive to light and accommodation. No scleral icterus.  Extraocular muscles intact.  HEENT: Head atraumatic, normocephalic. Oropharynx and nasopharynx clear.  NECK:  Supple, no jugular venous distention. No thyroid enlargement, no tenderness.  LUNGS: Normal breath sounds bilaterally, no wheezing, rales,rhonchi or crepitation. No use of accessory muscles of respiration.  CARDIOVASCULAR: S1, S2 normal. No murmurs, rubs, or gallops.  ABDOMEN: Soft, nontender, nondistended. Bowel sounds present. No organomegaly or mass.  EXTREMITIES: No pedal edema, cyanosis, or clubbing. Left lower extremity is shortened and rotated  outwards NEUROLOGIC: Cranial nerves II through XII are intact. Muscle strength 5/5 in all extremities, unable to use left lower extremity due to severe pain. Sensation grossly intact. Gait not checked.  PSYCHIATRIC: The patient is alert and oriented x 3. Difficulty hearing SKIN: No obvious rash, lesion, or ulcer.   LABORATORY PANEL:   CBC  Recent Labs Lab 10/20/15 1726  WBC 10.9  HGB 11.8*  HCT 34.4*  PLT 269  MCV 87.6  MCH 30.1  MCHC 34.3  RDW 14.3  LYMPHSABS 1.6  MONOABS 0.9  EOSABS 0.0  BASOSABS 0.0   ------------------------------------------------------------------------------------------------------------------  Chemistries   Recent Labs Lab 10/20/15 1726  NA 134*  K 5.0  CL 98*  CO2 26  GLUCOSE 265*  BUN 24*  CREATININE 1.37*  CALCIUM 9.6  AST 23  ALT 14  ALKPHOS 71  BILITOT 1.0   ------------------------------------------------------------------------------------------------------------------  Cardiac Enzymes No results for input(s): TROPONINI in the last 168 hours. ------------------------------------------------------------------------------------------------------------------  RADIOLOGY: Dg Chest 1 View  10/20/2015  CLINICAL DATA:  Preop for hip fracture EXAM: CHEST 1 VIEW COMPARISON:  Eleven/1/14 FINDINGS: Cardiomediastinal silhouette is stable. No acute infiltrate or pleural effusion. No pulmonary edema. Atherosclerotic calcifications of thoracic aorta again noted. Mild elevation of the right hemidiaphragm again noted. IMPRESSION: No active disease. Electronically Signed   By: Lahoma Crocker M.D.   On: 10/20/2015 17:12   Dg Hip Unilat With Pelvis 2-3 Views Left  10/20/2015  CLINICAL DATA:  Fall, left hip pain EXAM: DG HIP (WITH OR WITHOUT PELVIS) 2-3V LEFT COMPARISON:  None. FINDINGS: Three views of the left hip submitted. There is mild impacted with minimal angulation intertrochanteric fracture of proximal left femur. IMPRESSION: Mild impacted minimal  angulated intertrochanteric fracture of proximal left femur. Electronically Signed   By: Lahoma Crocker M.D.   On: 10/20/2015 17:15    EKG: Orders placed or performed during the hospital encounter of 10/20/15  . ED EKG  . ED EKG  . EKG 12-Lead  . EKG 12-Lead   EKG in the emergency room reveals sinus tachycardia at rate of 112 bpm, normal axis. Poor R-wave progression in V3. Ventricular Premature complexes. No acute ST-T changes IMPRESSION AND PLAN:  Active Problems:   Intertrochanteric fracture of left hip (HCC)   Hyponatremia   Sinus tachycardia (H[facility])   Chronic renal insufficiency   Anemia   Closed left hip fracture ([place])  #1. Intertrochanteric fracture of left hip, continue pain medications, orthopedist surgeon consultation is requested, likely operation tomorrow, orthopedist surgeon plans ORIF. Patient does have moderate risk factors for cardiac vessel complications perioperatively , continue propranolol  #2. Hyponatremia, continue patient on IV fluids and follow sodium level in the morning #3. Chronic renal insufficiency, followed with IV fluid administration. Get urinalysis #4 Sinus  tachycardia, likely dehydration/intravascular depletion related, continue IV fluids, continue propranolol #5, tremor, continue propranolol   All the records are reviewed and case discussed with ED provider. Management plans discussed with the patient, family and they are in agreement.  CODE STATUS: Code Status History  This patient does not have a recorded code status. Please follow your organizational policy for patients in this situation.       TOTAL TIME TAKING CARE OF THIS PATIENT: 55 minutes.    Theodoro Grist M.D on 10/20/2015 at 6:29 PM  Between 7am to 6pm - Pager - [phone] After 6pm go to [email or web address] - password [email or web address]  [place]  Office  [phone]  CC: Primary care physician; No primary care provider on file.

## 2015-10-20 NOTE — ED Notes (Signed)
Pt presents to ED with left hip pain after falling off the edge of the bed this morning. Denies LOC or hitting head.

## 2015-10-21 ENCOUNTER — Encounter
Admission: EM | Disposition: A | Discharge status: Skilled Nursing Facility | Payer: Self-pay | Source: Home / Self Care | Attending: Specialist

## 2015-10-21 ENCOUNTER — Inpatient Hospital Stay: Payer: Commercial Managed Care - HMO

## 2015-10-21 ENCOUNTER — Inpatient Hospital Stay: Payer: Commercial Managed Care - HMO | Admitting: Anesthesiology

## 2015-10-21 ENCOUNTER — Encounter: Payer: Self-pay | Admitting: *Deleted

## 2015-10-21 HISTORY — PX: INTRAMEDULLARY (IM) NAIL INTERTROCHANTERIC: SHX5875

## 2015-10-21 LAB — BASIC METABOLIC PANEL
Anion gap: 4 — ABNORMAL LOW (ref 5–15)
BUN: 29 mg/dL — ABNORMAL HIGH (ref 6–20)
CHLORIDE: 103 mmol/L (ref 101–111)
CO2: 27 mmol/L (ref 22–32)
CREATININE: 0.92 mg/dL (ref 0.44–1.00)
Calcium: 8.4 mg/dL — ABNORMAL LOW (ref 8.9–10.3)
GFR calc non Af Amer: 56 mL/min — ABNORMAL LOW (ref >60)
GLUCOSE: 229 mg/dL — AB (ref 65–99)
Potassium: 4.5 mmol/L (ref 3.5–5.1)
Sodium: 134 mmol/L — ABNORMAL LOW (ref 135–145)

## 2015-10-21 LAB — URINALYSIS COMPLETE WITH MICROSCOPIC (ARMC ONLY)
Bacteria, UA: NONE SEEN
Bilirubin Urine: NEGATIVE
Glucose, UA: 50 mg/dL — AB
Hgb urine dipstick: NEGATIVE
KETONES UR: NEGATIVE mg/dL
NITRITE: NEGATIVE
PROTEIN: NEGATIVE mg/dL
Specific Gravity, Urine: 1.019 (ref 1.005–1.030)
pH: 5 (ref 5.0–8.0)

## 2015-10-21 LAB — CBC
HCT: 28.4 % — ABNORMAL LOW (ref 35.0–47.0)
HEMOGLOBIN: 9.7 g/dL — AB (ref 12.0–16.0)
MCH: 30 pg (ref 26.0–34.0)
MCHC: 34.2 g/dL (ref 32.0–36.0)
MCV: 87.7 fL (ref 80.0–100.0)
PLATELETS: 175 10*3/uL (ref 150–440)
RBC: 3.24 MIL/uL — AB (ref 3.80–5.20)
RDW: 14.8 % — ABNORMAL HIGH (ref 11.5–14.5)
WBC: 6 10*3/uL (ref 3.6–11.0)

## 2015-10-21 LAB — GLUCOSE, CAPILLARY
Glucose-Capillary: 221 mg/dL — ABNORMAL HIGH (ref 65–99)
Glucose-Capillary: 164 mg/dL — ABNORMAL HIGH (ref 65–99)
Glucose-Capillary: 254 mg/dL — ABNORMAL HIGH (ref 65–99)
Glucose-Capillary: 154 mg/dL — ABNORMAL HIGH (ref 65–99)

## 2015-10-21 LAB — MRSA PCR SCREENING: MRSA by PCR: NEGATIVE

## 2015-10-21 SURGERY — FIXATION, FRACTURE, INTERTROCHANTERIC, WITH INTRAMEDULLARY ROD
Anesthesia: Spinal | Site: Hip | Laterality: Left | Wound class: Clean

## 2015-10-21 MED ORDER — MAGNESIUM HYDROXIDE 400 MG/5ML PO SUSP
30.0000 mL | Freq: Every day | ORAL | Status: DC | PRN
  Administered 2015-10-22: 30 mL via ORAL
  Filled 2015-10-21 (×2): qty 30

## 2015-10-21 MED ORDER — METHOCARBAMOL 500 MG PO TABS
500.0000 mg | ORAL_TABLET | Freq: Four times a day (QID) | ORAL | Status: DC | PRN
  Administered 2015-10-22: 500 mg via ORAL
  Filled 2015-10-21: qty 1

## 2015-10-21 MED ORDER — ACETAMINOPHEN 10 MG/ML IV SOLN
INTRAVENOUS | Status: DC | PRN
  Administered 2015-10-21: 1000 mg via INTRAVENOUS

## 2015-10-21 MED ORDER — ONDANSETRON HCL 4 MG/2ML IJ SOLN: 4.0000 mg | Freq: Four times a day (QID) | INTRAMUSCULAR | Status: DC | PRN

## 2015-10-21 MED ORDER — LISINOPRIL 20 MG PO TABS
40.0000 mg | ORAL_TABLET | Freq: Every day | ORAL | Status: DC
  Administered 2015-10-21 – 2015-10-23 (×3): 40 mg via ORAL
  Filled 2015-10-21 (×3): qty 2

## 2015-10-21 MED ORDER — ACETAMINOPHEN 10 MG/ML IV SOLN
INTRAVENOUS | Status: AC
  Filled 2015-10-21: qty 100

## 2015-10-21 MED ORDER — PHENOL 1.4 % MT LIQD: 1.0000 | OROMUCOSAL | Status: DC | PRN

## 2015-10-21 MED ORDER — DIAZEPAM 5 MG/ML IJ SOLN
2.0000 mg | Freq: Once | INTRAMUSCULAR | Status: AC
  Administered 2015-10-21: 2 mg via INTRAVENOUS
  Filled 2015-10-21: qty 2

## 2015-10-21 MED ORDER — ENOXAPARIN SODIUM 40 MG/0.4ML ~~LOC~~ SOLN
40.0000 mg | SUBCUTANEOUS | Status: DC
  Administered 2015-10-22 – 2015-10-24 (×3): 40 mg via SUBCUTANEOUS
  Filled 2015-10-21 (×3): qty 0.4

## 2015-10-21 MED ORDER — ENOXAPARIN SODIUM 40 MG/0.4ML ~~LOC~~ SOLN: 40.0000 mg | SUBCUTANEOUS | Status: DC

## 2015-10-21 MED ORDER — ONDANSETRON HCL 4 MG PO TABS: 4.0000 mg | ORAL_TABLET | Freq: Four times a day (QID) | ORAL | Status: DC | PRN

## 2015-10-21 MED ORDER — NEOMYCIN-POLYMYXIN B GU 40-200000 IR SOLN
Status: DC | PRN
  Administered 2015-10-21: 2 mL

## 2015-10-21 MED ORDER — EPHEDRINE SULFATE 50 MG/ML IJ SOLN
INTRAMUSCULAR | Status: DC | PRN
  Administered 2015-10-21: 5 mg via INTRAVENOUS
  Administered 2015-10-21: 10 mg via INTRAVENOUS
  Administered 2015-10-21 (×2): 5 mg via INTRAVENOUS

## 2015-10-21 MED ORDER — METOCLOPRAMIDE HCL 10 MG PO TABS: 5.0000 mg | ORAL_TABLET | Freq: Three times a day (TID) | ORAL | Status: DC | PRN

## 2015-10-21 MED ORDER — BUPIVACAINE HCL (PF) 0.5 % IJ SOLN
INTRAMUSCULAR | Status: DC | PRN
  Administered 2015-10-21: 2.5 mL

## 2015-10-21 MED ORDER — SODIUM CHLORIDE 0.9 % IV SOLN
INTRAVENOUS | Status: DC
  Administered 2015-10-21 – 2015-10-22 (×3): via INTRAVENOUS

## 2015-10-21 MED ORDER — FENTANYL CITRATE (PF) 100 MCG/2ML IJ SOLN: 25.0000 ug | INTRAMUSCULAR | Status: DC | PRN

## 2015-10-21 MED ORDER — MIDAZOLAM HCL 5 MG/5ML IJ SOLN
INTRAMUSCULAR | Status: DC | PRN
  Administered 2015-10-21: 1 mg via INTRAVENOUS

## 2015-10-21 MED ORDER — METOCLOPRAMIDE HCL 5 MG/ML IJ SOLN: 5.0000 mg | Freq: Three times a day (TID) | INTRAMUSCULAR | Status: DC | PRN

## 2015-10-21 MED ORDER — MENTHOL 3 MG MT LOZG: 1.0000 | LOZENGE | OROMUCOSAL | Status: DC | PRN

## 2015-10-21 MED ORDER — NEOMYCIN-POLYMYXIN B GU 40-200000 IR SOLN
Status: AC
  Filled 2015-10-21: qty 2

## 2015-10-21 MED ORDER — MAGNESIUM CITRATE PO SOLN: 1.0000 | Freq: Once | ORAL | Status: DC | PRN

## 2015-10-21 MED ORDER — CEFAZOLIN SODIUM 1-5 GM-% IV SOLN
1.0000 g | Freq: Four times a day (QID) | INTRAVENOUS | Status: AC
  Administered 2015-10-21 – 2015-10-22 (×3): 1 g via INTRAVENOUS
  Filled 2015-10-21 (×3): qty 50

## 2015-10-21 MED ORDER — ALUM & MAG HYDROXIDE-SIMETH 200-200-20 MG/5ML PO SUSP: 30.0000 mL | ORAL | Status: DC | PRN

## 2015-10-21 MED ORDER — PHENYLEPHRINE HCL 10 MG/ML IJ SOLN
INTRAMUSCULAR | Status: DC | PRN
  Administered 2015-10-21 (×4): 100 ug via INTRAVENOUS

## 2015-10-21 MED ORDER — FENTANYL CITRATE (PF) 100 MCG/2ML IJ SOLN
INTRAMUSCULAR | Status: DC | PRN
  Administered 2015-10-21: 25 ug via INTRAVENOUS

## 2015-10-21 MED ORDER — INSULIN ASPART 100 UNIT/ML ~~LOC~~ SOLN
0.0000 [IU] | Freq: Three times a day (TID) | SUBCUTANEOUS | Status: DC
  Administered 2015-10-21 – 2015-10-22 (×2): 5 [IU] via SUBCUTANEOUS
  Administered 2015-10-22: 1 [IU] via SUBCUTANEOUS
  Administered 2015-10-22: 3 [IU] via SUBCUTANEOUS
  Administered 2015-10-23: 7 [IU] via SUBCUTANEOUS
  Administered 2015-10-23: 3 [IU] via SUBCUTANEOUS
  Administered 2015-10-23: 2 [IU] via SUBCUTANEOUS
  Administered 2015-10-24: 7 [IU] via SUBCUTANEOUS
  Administered 2015-10-24: 3 [IU] via SUBCUTANEOUS
  Filled 2015-10-21: qty 3
  Filled 2015-10-21 (×2): qty 7
  Filled 2015-10-21: qty 5
  Filled 2015-10-21: qty 1
  Filled 2015-10-21: qty 5
  Filled 2015-10-21: qty 3
  Filled 2015-10-21: qty 2
  Filled 2015-10-21: qty 3

## 2015-10-21 MED ORDER — ONDANSETRON HCL 4 MG/2ML IJ SOLN: 4.0000 mg | Freq: Once | INTRAMUSCULAR | Status: DC | PRN

## 2015-10-21 MED ORDER — BISACODYL 10 MG RE SUPP
10.0000 mg | Freq: Every day | RECTAL | Status: DC | PRN
  Administered 2015-10-23: 10 mg via RECTAL
  Filled 2015-10-21 (×2): qty 1

## 2015-10-21 SURGICAL SUPPLY — 36 items
BIT DRILL 4.3MMS DISTAL GRDTED (BIT) ×1 IMPLANT
CANISTER SUCT 1200ML W/VALVE (MISCELLANEOUS) ×3 IMPLANT
CHLORAPREP W/TINT 26ML (MISCELLANEOUS) ×3 IMPLANT
DRAPE SHEET LG 3/4 BI-LAMINATE (DRAPES) ×3 IMPLANT
DRAPE SURG 17X11 SM STRL (DRAPES) ×3 IMPLANT
DRAPE U-SHAPE 47X51 STRL (DRAPES) ×3 IMPLANT
DRILL 4.3MMS DISTAL GRADUATED (BIT) ×3
DRSG OPSITE POSTOP 4X6 (GAUZE/BANDAGES/DRESSINGS) ×9 IMPLANT
ELECT REM PT RETURN 9FT ADLT (ELECTROSURGICAL) ×3
ELECTRODE REM PT RTRN 9FT ADLT (ELECTROSURGICAL) ×1 IMPLANT
GAUZE SPONGE 4X4 12PLY STRL (GAUZE/BANDAGES/DRESSINGS) ×3 IMPLANT
GLOVE BIOGEL PI IND STRL 9 (GLOVE) ×1 IMPLANT
GLOVE BIOGEL PI INDICATOR 9 (GLOVE) ×2
GLOVE SURG ORTHO 9.0 STRL STRW (GLOVE) ×3 IMPLANT
GOWN STRL REUS W/ TWL LRG LVL3 (GOWN DISPOSABLE) ×1 IMPLANT
GOWN STRL REUS W/TWL LRG LVL3 (GOWN DISPOSABLE) ×3
GOWN SURG XXL (GOWNS) ×3 IMPLANT
GUIDEPIN VERSANAIL DSP 3.2X444 ×3 IMPLANT
GUIDEWIRE BALL NOSE 80CM (WIRE) ×3 IMPLANT
HFN LH 130 DEG 9MM X 360MM (Nail) ×3 IMPLANT
HIP FRAC NAIL LAG SCR 10.5X100 (Orthopedic Implant) ×2 IMPLANT
IV NS 500ML (IV SOLUTION) ×2
IV NS 500ML BAXH (IV SOLUTION) ×1 IMPLANT
KIT RM TURNOVER STRD PROC AR (KITS) ×3 IMPLANT
MAT BLUE FLOOR 46X72 FLO (MISCELLANEOUS) ×3 IMPLANT
NEEDLE FILTER BLUNT 18X 1/2SAF (NEEDLE) ×2
NEEDLE FILTER BLUNT 18X1 1/2 (NEEDLE) ×1 IMPLANT
PACK HIP COMPR (MISCELLANEOUS) ×3 IMPLANT
SCREW BONE CORTICAL 5.0X42 (Screw) ×3 IMPLANT
SCREW CANN THRD AFF 10.5X100 (Orthopedic Implant) ×1 IMPLANT
STAPLER SKIN PROX 35W (STAPLE) ×3 IMPLANT
SUT VIC AB 1 CT1 36 (SUTURE) ×3 IMPLANT
SUT VIC AB 2-0 CT1 (SUTURE) ×3 IMPLANT
SYRINGE 10CC LL (SYRINGE) ×3 IMPLANT
TAPE MICROFOAM 4IN (TAPE) ×3 IMPLANT
THREADED GUIDE PIN-VERSANAIL ×3 IMPLANT

## 2015-10-21 NOTE — Progress Notes (Signed)
Patient ID: Carrie Bishop, female   DOB: 11/20/1928, 82 y.o.   MRN: [id number] Carrie Bishop at [place] NAME: Carrie Bishop    MR#:  [id number]  DATE OF BIRTH:  1929-04-03  SUBJECTIVE:   Doing well. Hip pain REVIEW OF SYSTEMS:   Review of Systems  Constitutional: Negative for fever, chills and weight loss.  HENT: Negative for ear discharge, ear pain and nosebleeds.   Eyes: Negative for blurred vision, pain and discharge.  Respiratory: Negative for sputum production, shortness of breath, wheezing and stridor.   Cardiovascular: Negative for chest pain, palpitations, orthopnea and PND.  Gastrointestinal: Negative for nausea, vomiting, abdominal pain and diarrhea.  Genitourinary: Negative for urgency and frequency.  Musculoskeletal: Positive for joint pain and falls. Negative for back pain.  Neurological: Negative for sensory change, speech change, focal weakness and weakness.  Psychiatric/Behavioral: Negative for depression and hallucinations. The patient is not nervous/anxious.   All other systems reviewed and are negative.  Tolerating Diet:npo for surgery Tolerating PT: pending  DRUG ALLERGIES:  No Known Allergies  VITALS:  Blood pressure 130/71, pulse 61, temperature 97.5 F (36.4 C), temperature source Oral, resp. rate 18, height 5\' 6"  (1.676 m), weight 58.287 kg (128 lb 8 oz), SpO2 98 %.  PHYSICAL EXAMINATION:   Physical Exam  GENERAL:  82 y.o.-year-old patient lying in the bed with no acute distress.  EYES: Pupils equal, round, reactive to light and accommodation. No scleral icterus. Extraocular muscles intact.  HEENT: Head atraumatic, normocephalic. Oropharynx and nasopharynx clear.  NECK:  Supple, no jugular venous distention. No thyroid enlargement, no tenderness.  LUNGS: Normal breath sounds bilaterally, no wheezing, rales, rhonchi. No use of accessory muscles of respiration.  CARDIOVASCULAR: S1, S2 normal. No murmurs,  rubs, or gallops.  ABDOMEN: Soft, nontender, nondistended. Bowel sounds present. No organomegaly or mass.  EXTREMITIES: No cyanosis, clubbing or edema b/l.   Leg LE rotated and decreased ROM NEUROLOGIC: Cranial nerves II through XII are intact. No focal Motor or sensory deficits b/l.   PSYCHIATRIC:  patient is alert and oriented x 3.  SKIN: No obvious rash, lesion, or ulcer.   LABORATORY PANEL:  CBC  Recent Labs Lab 10/21/15 0559  WBC 6.0  HGB 9.7*  HCT 28.4*  PLT 175    Chemistries   Recent Labs Lab 10/20/15 1726 10/21/15 0559  NA 134* 134*  K 5.0 4.5  CL 98* 103  CO2 26 27  GLUCOSE 265* 229*  BUN 24* 29*  CREATININE 1.37* 0.92  CALCIUM 9.6 8.4*  AST 23  --   ALT 14  --   ALKPHOS 71  --   BILITOT 1.0  --    Cardiac Enzymes No results for input(s): TROPONINI in the last 168 hours. RADIOLOGY:  Dg Chest 1 View  10/20/2015  CLINICAL DATA:  Preop for hip fracture EXAM: CHEST 1 VIEW COMPARISON:  Eleven/1/14 FINDINGS: Cardiomediastinal silhouette is stable. No acute infiltrate or pleural effusion. No pulmonary edema. Atherosclerotic calcifications of thoracic aorta again noted. Mild elevation of the right hemidiaphragm again noted. IMPRESSION: No active disease. Electronically Signed   By: Lahoma Crocker M.D.   On: 10/20/2015 17:12   Dg Hip Unilat With Pelvis 2-3 Views Left  10/20/2015  CLINICAL DATA:  Fall, left hip pain EXAM: DG HIP (WITH OR WITHOUT PELVIS) 2-3V LEFT COMPARISON:  None. FINDINGS: Three views of the left hip submitted. There is mild impacted with minimal angulation intertrochanteric fracture of proximal left  femur. IMPRESSION: Mild impacted minimal angulated intertrochanteric fracture of proximal left femur. Electronically Signed   By: Lahoma Crocker M.D.   On: 10/20/2015 17:15   ASSESSMENT AND PLAN:  Carrie Bishop is a 82 y.o. female with a known history of diabetes, neuropathy, hypertension, stroke,urinary incontinence, RLS came to the ER after her leg gave out  and had a mechanical fall at home she is admitted with...   #1. Intertrochanteric fracture of left hip, -continue pain medications, -pt is at intermediate risk of surgery. Cont Inderall -Dr Rudene Christians input appreciated for surgery today  #2. Hyponatremia, continue patient on IV fluids and follow sodium level in the morning  #3. Chronic renal insufficiency, followed with IV fluid administration  #4 Sinus tachycardia continue propranolol -resolved  #5 Essential tremors - continue propranolol  Case discussed with Care Management/Social Worker. Management plans discussed with the patient, family and they are in agreement.  CODE STATUS: full code  DVT Prophylaxis: lovenox  TOTAL TIME TAKING CARE OF THIS PATIENT: 40 minutes.  >50% time spent on counselling and coordination of care  POSSIBLE D/C IN 2-3 DAYS, DEPENDING ON CLINICAL CONDITION.  Note: This dictation was prepared with Dragon dictation along with smaller phrase technology. Any transcriptional errors that result from this process are unintentional.  Dwayne Begay M.D on 10/21/2015 at 10:51 AM  Between 7am to 6pm - Pager - [phone]  After 6pm go to [email or web address] - password [email or web address]  [place]  Office  [phone]  CC: Primary care physician; No primary care provider on file.

## 2015-10-21 NOTE — Op Note (Signed)
10/20/2015 - 10/21/2015  12:54 PM  PATIENT:  Carrie Bishop  82 y.o. female  PRE-OPERATIVE DIAGNOSIS:  LEFT HIP FX intertrochanteric  POST-OPERATIVE DIAGNOSIS:  left hip fracture  PROCEDURE:  Procedure(s): INTRAMEDULLARY (IM) NAIL INTERTROCHANTRIC LEFT LONG AFFIXUS (Left)  SURGEON: Laurene Footman, MD  ASSISTANTS: None  ANESTHESIA:   spinal  EBL:  Total I/O In: 1025 [I.V.:1025] Out: 350 [Urine:300; Blood:50]  BLOOD ADMINISTERED:none  DRAINS: none   LOCAL MEDICATIONS USED:  NONE  SPECIMEN:  No Specimen  DISPOSITION OF SPECIMEN:  N/A  COUNTS:  YES  TOURNIQUET:  * No tourniquets in log *  IMPLANTS: 9 x 360 mm left affixes rod with 100 mm lag screw 42 mm distal interlocking screw  DICTATION: .Dragon Dictation patient brought the operating room and after adequate spinal anesthesia was obtained, patient was transferred to the fracture table with the right leg in the well-leg holder left leg in the traction boot. C-arm was brought in and good visualization of the fracture was obtained and it was minimally displaced. The hip was then prepped and draped using a Barrier drape method. After patient identification and timeout procedures were completed, a small incision was made proximally greater trochanter. A guidewire was inserted in the center trochanter and proximal reaming carried out. Guidewire was inserted down the canal and measurements made. Reaming was carried out to 11 mm and the appropriate sized rod was obtained and inserted down the canal. It was of the appropriate position guidewires inserted in a center center position into the head. This was measured and 100 mm lag screw was obtained at and drilling carried out. Traction was released after the leg screw was inserted and compression applied to the compression device. The leg screw was then set with the antirotation screw proximally with a quarter turn to allow for additional compression. Insertion handle was removed and AP  lateral imaging obtained. Going distally upper lateral views obtained of the oblique screw hole and a single screw was placed making a small incision drilling measuring and placing a 42 mm 5.0 screw. C-arm views of that were obtained the wounds were irrigated wounds closed with #1 Vicryl for deep fascia 2-0 Vicryl subcutaneously skin staples. Xeroform and honeycomb dressing applied  PLAN OF CARE: Continue as an inpatient  PATIENT DISPOSITION:  PACU - hemodynamically stable.

## 2015-10-21 NOTE — Anesthesia Preprocedure Evaluation (Addendum)
Anesthesia Evaluation  Patient identified by MRN, date of birth, ID band  Reviewed: Allergy & Precautions, NPO status , Patient's Chart, lab work & pertinent test results  Airway Mallampati: II       Dental  (+) Upper Dentures, Lower Dentures   Pulmonary neg pulmonary ROS,    Pulmonary exam normal        Cardiovascular Exercise Tolerance: Poor hypertension, Pt. on medications  Rhythm:Regular Rate:Normal     Neuro/Psych Depression CVA    GI/Hepatic negative GI ROS, Neg liver ROS,   Endo/Other  diabetes, Type 2, Oral Hypoglycemic Agents  Renal/GU Renal InsufficiencyRenal disease     Musculoskeletal   Abdominal Normal abdominal exam  (+)   Peds negative pediatric ROS (+)  Hematology  (+) anemia ,   Anesthesia Other Findings   Reproductive/Obstetrics                            Anesthesia Physical Anesthesia Plan  ASA: III  Anesthesia Plan: Spinal   Post-op Pain Management:    Induction:   Airway Management Planned: Natural Airway and Nasal Cannula  Additional Equipment:   Intra-op Plan:   Post-operative Plan:   Informed Consent: I have reviewed the patients History and Physical, chart, labs and discussed the procedure including the risks, benefits and alternatives for the proposed anesthesia with the patient or authorized representative who has indicated his/her understanding and acceptance.     Plan Discussed with: CRNA  Anesthesia Plan Comments:         Anesthesia Quick Evaluation

## 2015-10-21 NOTE — Progress Notes (Signed)
Inpatient Diabetes Program Recommendations  AACE/ADA: New Consensus Statement on Inpatient Glycemic Control (2015)  Target Ranges:  Prepandial:   less than 140 mg/dL      Peak postprandial:   less than 180 mg/dL (1-2 hours)      Critically ill patients:  140 - 180 mg/dL   Review of Glycemic Control  Results for Carrie Bishop, Carrie Bishop (MRN [id number]) as of 10/21/2015 13:31  Ref. Range 10/21/2015 08:14 10/21/2015 12:59  Glucose-Capillary Latest Ref Range: 65-99 mg/dL 221 (H) 164 (H)    Diabetes history: Type 2 Outpatient Diabetes medications: Metformin 1500mg  q day Current orders for Inpatient glycemic control: Novolog 0-9 units tid with meals  Inpatient Diabetes Program Recommendations: Agree with current medication orders for diabetes.  Please consider checking an A1C.    Per ADA recommendations "consider performing an A1C on all patients with diabetes or hypreglycemia admitted to the hospital if not performed in the prior 3 months".   Gentry Fitz, RN, BA, MHA, CDE Diabetes Coordinator Inpatient Diabetes Program  [phone] (Team Pager) [phone] ([place]) 10/21/2015 1:34 PM

## 2015-10-21 NOTE — Progress Notes (Signed)
Paged MD for clarification on metformin, currently not ordered, previous conversation with Dr. Posey Pronto was that we would start after surgery. No call back from Dr. Posey Pronto (5:50pm) so paged [facility]. Per Dr. Ether Griffins, BUN/Cr. is slightly elevated, please check with attending about starting this medication. Continue SSI coverage this evening. Will pass along info to oncoming nurse.

## 2015-10-21 NOTE — Progress Notes (Signed)
Pt. Crying with pain. Valium 2mg  iv ordered once and robaxin 500mg  qid ordered per Dr. Rudene Christians.

## 2015-10-21 NOTE — Clinical Social Work Placement (Signed)
   CLINICAL SOCIAL WORK PLACEMENT  NOTE  Date:  10/21/2015  Patient Details  Name: SANAA HARTZELL MRN: [id number] Date of Birth: 1929/04/18  Clinical Social Work is seeking post-discharge placement for this patient at the [place] level of care (*CSW will initial, date and re-position this form in  chart as items are completed):  Yes   Patient/family provided with [facility] Work Department's list of facilities offering this level of care within the geographic area requested by the patient (or if unable, by the patient's family).  Yes   Patient/family informed of their freedom to choose among providers that offer the needed level of care, that participate in Medicare, Medicaid or managed care program needed by the patient, have an available bed and are willing to accept the patient.  Yes   Patient/family informed of [facility]'s ownership interest in [facility] and [facility], as well as of the fact that they are under no obligation to receive care at these facilities.  PASRR submitted to EDS on       PASRR number received on       Existing PASRR number confirmed on 10/21/15     FL2 transmitted to all facilities in geographic area requested by pt/family on 10/21/15     FL2 transmitted to all facilities within larger geographic area on       Patient informed that his/her managed care company has contracts with or will negotiate with certain facilities, including the following:            Patient/family informed of bed offers received.  Patient chooses bed at       Physician recommends and patient chooses bed at      Patient to be transferred to   on  .  Patient to be transferred to facility by       Patient family notified on   of transfer.  Name of family member notified:        PHYSICIAN       Additional Comment:    _______________________________________________ Loralyn Freshwater, LCSW 10/21/2015, 2:03 PM

## 2015-10-21 NOTE — Progress Notes (Signed)
Anticoagulation Policy-Lovenox Dosing  Patient is an 82 yo female with orders for Lovenox 30 mg subq q24h.  Patient's est CrCl~42 mL/min.  Note on original order indicated pharmacy may adjust dosing and to skip dose on 3/23 due to surgery.  No Lovenox administered on 3/23.  Will transition patient to Lovenox 40 mg subq q24h.  Dose scheduled to start today at 2030 per order.  Pharmacy will continue to follow and monitor per policy.   Murrell Converse, PharmD Clinical Pharmacist 10/21/2015

## 2015-10-21 NOTE — Transfer of Care (Signed)
Immediate Anesthesia Transfer of Care Note  Patient: Carrie Bishop  Procedure(s) Performed: Procedure(s): INTRAMEDULLARY (IM) NAIL INTERTROCHANTRIC LEFT LONG AFFIXUS (Left)  Patient Location: PACU  Anesthesia Type:Spinal  Level of Consciousness: sedated  Airway & Oxygen Therapy: Patient Spontanous Breathing and Patient connected to face mask oxygen  Post-op Assessment: Report given to RN and Post -op Vital signs reviewed and stable  Post vital signs: Reviewed and stable  Last Vitals:  Filed Vitals:   10/21/15 1105 10/21/15 1248  BP:  97/46  Pulse:  58  Temp: 36.4 C 36.3 C  Resp:  15    Complications: No apparent anesthesia complications

## 2015-10-21 NOTE — Clinical Social Work Note (Signed)
Clinical Social Work Assessment  Patient Details  Name: Carrie Bishop MRN: [id number] Date of Birth: 30-Jun-1929  Date of referral:  10/21/15               Reason for consult:  Facility Placement                Permission sought to share information with:  Chartered certified accountant granted to share information::  Yes, Verbal Permission Granted  Name::      [place]::   [place]   Relationship::     Contact Information:     Housing/Transportation Living arrangements for the past 2 months:  [place] of Information:  Patient Patient Interpreter Needed:  None Criminal Activity/Legal Involvement Pertinent to Current Situation/Hospitalization:  No - Comment as needed Significant Relationships:  Adult Children Lives with:  Adult Children Do you feel safe going back to the place where you live?  Yes Need for family participation in patient care:  Yes (Comment)  Care giving concerns:  Patient lives in [place] with her daughter [name].    Social Worker assessment / plan: [place] representative (CSW) reviewed chart and noted that patient is having surgery today for a hip fracture. CSW met with patient prior to surgery today. Patient was alert and oriented and was laying in the bed. CSW introduced self and explained role of CSW department. Patient reported that she lives in [place] with her daughter [name]. CSW explained that patient will have surgery today and PT will evaluate and make a recommendation of home health or SNF. Patient reported that she has been to SNF before and is agreeable to SNF search in [facility]. CSW explained that patient's insurance [facility] will have to approve SNF stay. Daughter verbalized her understanding and gave CSW permission to call her daughter. CSW attempted to call patient's daughter [name] and did not get an answer, voicemail was left. SNF list was provided to patient.   FL2  complete and faxed out. Amy [employer] [facility] case manager is aware of above.   Employment status:  Disabled (Comment on whether or not currently receiving Disability), Retired Nurse, adult PT Recommendations:  Not assessed at this time Information / Referral to community resources:  [place]  Patient/Family's Response to care: Patient is agreeable to [facility] in [place].   Patient/Family's Understanding of and Emotional Response to Diagnosis, Current Treatment, and Prognosis:  Patient was pleasant throughout assessment and thanked CSW for visit.   Emotional Assessment Appearance:  Appears stated age Attitude/Demeanor/Rapport:    Affect (typically observed):  Accepting, Adaptable, Pleasant Orientation:  Oriented to Self, Oriented to Place, Oriented to  Time, Oriented to Situation Alcohol / Substance use:  Not Applicable Psych involvement (Current and /or in the community):  No (Comment)  Discharge Needs  Concerns to be addressed:  Discharge Planning Concerns Readmission within the last 30 days:  No Current discharge risk:  Dependent with Mobility Barriers to Discharge:  Continued Medical Work up   Loralyn Freshwater, LCSW 10/21/2015, 2:06 PM

## 2015-10-21 NOTE — Care Management (Signed)
Surgery today. PT pending. List of home health agencies left with patient.

## 2015-10-21 NOTE — Anesthesia Postprocedure Evaluation (Signed)
Anesthesia Post Note  Patient: Carrie Bishop  Procedure(s) Performed: Procedure(s) (LRB): INTRAMEDULLARY (IM) NAIL INTERTROCHANTRIC LEFT LONG AFFIXUS (Left)  Patient location during evaluation: PACU Anesthesia Type: Spinal Level of consciousness: awake Pain management: pain level controlled Vital Signs Assessment: post-procedure vital signs reviewed and stable Respiratory status: spontaneous breathing Cardiovascular status: blood pressure returned to baseline Anesthetic complications: no    Last Vitals:  Filed Vitals:   10/21/15 1105 10/21/15 1248  BP:  97/46  Pulse:  58  Temp: 36.4 C 36.3 C  Resp:  15    Last Pain:  Filed Vitals:   10/21/15 1250  PainSc: Asleep                 VAN STAVEREN,Florence Antonelli

## 2015-10-21 NOTE — Progress Notes (Signed)
Pts. Incontinent of urine and UA is ordered. Dr. Marcille Blanco ordered in and out cath x1.

## 2015-10-21 NOTE — NC FL2 (Signed)
[place] LEVEL OF CARE SCREENING TOOL     IDENTIFICATION  Patient Name: Carrie Bishop Birthdate: 30-Jan-1929 Sex: female Admission Date (Current Location): 10/20/2015  [place] and [place] Number:  Engineering geologist and Address:  [facility], [address], [place], [place] [address]      Provider Number: [id number]  Attending Physician Name and Address:  Fritzi Mandes, MD  Relative Name and Phone Number:       Current Level of Care: Hospital Recommended Level of Care: [place] Prior Approval Number:    Date Approved/Denied:   PASRR Number:  ( [id number] )  Discharge Plan: SNF    Current Diagnoses: Patient Active Problem List   Diagnosis Date Noted  . Intertrochanteric fracture of left hip ([place]) 10/20/2015  . Hyponatremia 10/20/2015  . Sinus tachycardia ([place]) 10/20/2015  . Chronic renal insufficiency 10/20/2015  . Anemia 10/20/2015  . Closed left hip fracture ([place]) 10/20/2015    Orientation RESPIRATION BLADDER Height & Weight     Self, Time, Situation, Place  Normal Continent Weight: 128 lb (58.06 kg) Height:  5\' 6"  (167.6 cm)  BEHAVIORAL SYMPTOMS/MOOD NEUROLOGICAL BOWEL NUTRITION STATUS   (none )  (none ) Continent Diet (Regular Diet )  AMBULATORY STATUS COMMUNICATION OF NEEDS Skin   Extensive Assist Verbally Surgical wounds (Incision: Left Hip. )                       Personal Care Assistance Level of Assistance  Bathing, Feeding, Dressing Bathing Assistance: Limited assistance Feeding assistance: Independent Dressing Assistance: Limited assistance     Functional Limitations Info  Sight, Hearing, Speech Sight Info: Adequate Hearing Info: Adequate Speech Info: Adequate    SPECIAL CARE FACTORS FREQUENCY  PT (By licensed PT), OT (By licensed OT)     PT Frequency:  (5) OT Frequency:  (5)            Contractures      Additional Factors Info  Code Status, Allergies Code Status  Info:  (Full Code. ) Allergies Info:  (No Known Allergies. )           Current Medications (10/21/2015):  This is the current hospital active medication list Current Facility-Administered Medications  Medication Dose Route Frequency Provider Last Rate Last Dose  . 0.9 %  sodium chloride infusion   Intravenous Continuous Hinda Kehr, MD 10 mL/hr at 10/20/15 1738    . 0.9 %  sodium chloride infusion   Intravenous Continuous Theodoro Grist, MD      . 0.9 %  sodium chloride infusion   Intravenous Continuous Theodoro Grist, MD 100 mL/hr at 10/21/15 0452    . [MAR Hold] acetaminophen (TYLENOL) tablet 650 mg  650 mg Oral Q6H PRN Theodoro Grist, MD       Or  . Doug Sou Hold] acetaminophen (TYLENOL) suppository 650 mg  650 mg Rectal Q6H PRN Theodoro Grist, MD      . Doug Sou Hold] cholecalciferol (VITAMIN D) tablet 1,000 Units  1,000 Units Oral Daily Theodoro Grist, MD   1,000 Units at 10/20/15 2120  . [MAR Hold] docusate sodium (COLACE) capsule 100 mg  100 mg Oral BID Theodoro Grist, MD   100 mg at 10/20/15 2120  . [MAR Hold] DULoxetine (CYMBALTA) DR capsule 60 mg  60 mg Oral Daily Theodoro Grist, MD   60 mg at 10/20/15 2120  . [MAR Hold] enoxaparin (LOVENOX) injection 40 mg  40 mg Subcutaneous Q24H Crystal G  [place], [place]      . fentaNYL (SUBLIMAZE) injection 25 mcg  25 mcg Intravenous Q5 min PRN Gijsbertus F Boston Service, MD      . Doug Sou Hold] gabapentin (NEURONTIN) tablet 600 mg  600 mg Oral TID Theodoro Grist, MD   600 mg at 10/20/15 2120  . [MAR Hold] HYDROcodone-acetaminophen (NORCO/VICODIN) 5-325 MG per tablet 1-2 tablet  1-2 tablet Oral Q4H PRN Theodoro Grist, MD   2 tablet at 10/20/15 2119  . [MAR Hold] insulin aspart (novoLOG) injection 0-9 Units  0-9 Units Subcutaneous TID WC Hessie Knows, MD      . Doug Sou Hold] lisinopril (PRINIVIL,ZESTRIL) tablet 40 mg  40 mg Oral QHS Fritzi Mandes, MD      . Doug Sou Hold] morphine 4 MG/ML injection 4 mg  4 mg Intravenous Q30 min PRN Hinda Kehr, MD   4 mg at 10/20/15 1737  .  [MAR Hold] morphine 4 MG/ML injection 4 mg  4 mg Intravenous Q3H PRN Theodoro Grist, MD   4 mg at 10/20/15 1919  . ondansetron (ZOFRAN) injection 4 mg  4 mg Intravenous Once PRN Gijsbertus F Boston Service, MD      . Doug Sou Hold] pravastatin (PRAVACHOL) tablet 40 mg  40 mg Oral QHS Theodoro Grist, MD   40 mg at 10/20/15 2120  . [MAR Hold] propranolol (INDERAL) tablet 80 mg  80 mg Oral QHS Theodoro Grist, MD   80 mg at 10/20/15 2119  . [MAR Hold] senna (SENOKOT) tablet 8.6 mg  1 tablet Oral BID Theodoro Grist, MD   8.6 mg at 10/20/15 2120  . [MAR Hold] sodium chloride flush (NS) 0.9 % injection 3 mL  3 mL Intravenous Q12H Theodoro Grist, MD   3 mL at 10/20/15 2328  . [MAR Hold] traZODone (DESYREL) tablet 100-200 mg  100-200 mg Oral QHS PRN Theodoro Grist, MD         Discharge Medications: Please see discharge summary for a list of discharge medications.  Relevant Imaging Results:  Relevant Lab Results:   Additional Information  (SSN: [id number])  Loralyn Freshwater, LCSW

## 2015-10-21 NOTE — Progress Notes (Signed)
Pt has hx. of diabetes, takes metformin BID. No orders present for SSI or metformin. Notified MD. Dr. Rudene Christians updated orders this AM. Pt's BG 221. NPO this AM for surgery at 1100. Per MD, no coverage needed at this time, will continue to monitor.

## 2015-10-21 NOTE — Anesthesia Procedure Notes (Signed)
Spinal Patient location during procedure: OR Start time: 10/21/2015 11:33 AM End time: 10/21/2015 11:41 AM Reason for block: at surgeon's request Staffing Anesthesiologist: Marline Backbone F Performed by: anesthesiologist  Preanesthetic Checklist Completed: patient identified, site marked, surgical consent, pre-op evaluation, timeout performed, IV checked, risks and benefits discussed, monitors and equipment checked and at surgeon's request Spinal Block Patient position: left lateral decubitus Prep: Betasept Patient monitoring: heart rate and blood pressure Approach: midline Location: L3-4 Injection technique: single-shot Needle Needle type: Tuohy  Needle gauge: 22 G Needle length: 9 cm Needle insertion depth: 6 cm Assessment Sensory level: T8

## 2015-10-22 LAB — GLUCOSE, CAPILLARY
GLUCOSE-CAPILLARY: 202 mg/dL — AB (ref 65–99)
GLUCOSE-CAPILLARY: 127 mg/dL — AB (ref 65–99)
Glucose-Capillary: 298 mg/dL — ABNORMAL HIGH (ref 65–99)
Glucose-Capillary: 175 mg/dL — ABNORMAL HIGH (ref 65–99)

## 2015-10-22 LAB — BASIC METABOLIC PANEL
Anion gap: 3 — ABNORMAL LOW (ref 5–15)
BUN: 23 mg/dL — AB (ref 4–21)
BUN: 23 mg/dL — AB (ref 6–20)
CALCIUM: 8.1 mg/dL — AB (ref 8.9–10.3)
CO2: 25 mmol/L (ref 22–32)
Chloride: 107 mmol/L (ref 101–111)
Creatinine, Ser: 0.83 mg/dL (ref 0.44–1.00)
Creatinine: 0.8 mg/dL (ref 0.5–1.1)
GFR calc Af Amer: >60 mL/min (ref >60)
GLUCOSE: 208 mg/dL — AB (ref 65–99)
Glucose: 208 mg/dL
Potassium: 4.4 mmol/L (ref 3.5–5.1)
SODIUM: 135 mmol/L — AB (ref 137–147)
Sodium: 135 mmol/L (ref 135–145)

## 2015-10-22 LAB — CBC
HCT: 25.4 % — ABNORMAL LOW (ref 35.0–47.0)
Hemoglobin: 8.7 g/dL — ABNORMAL LOW (ref 12.0–16.0)
MCH: 30.1 pg (ref 26.0–34.0)
MCHC: 34.2 g/dL (ref 32.0–36.0)
MCV: 87.9 fL (ref 80.0–100.0)
PLATELETS: 143 10*3/uL — AB (ref 150–440)
RBC: 2.89 MIL/uL — ABNORMAL LOW (ref 3.80–5.20)
RDW: 14.8 % — AB (ref 11.5–14.5)
WBC: 6.8 10*3/uL (ref 3.6–11.0)

## 2015-10-22 LAB — CBC AND DIFFERENTIAL: WBC: 6.8 10^3/mL

## 2015-10-22 MED ORDER — HYDROCODONE-ACETAMINOPHEN 5-325 MG PO TABS
1.0000 | ORAL_TABLET | ORAL | Status: DC | PRN
  Administered 2015-10-23: 1 via ORAL
  Filled 2015-10-22: qty 1

## 2015-10-22 MED ORDER — HALOPERIDOL LACTATE 5 MG/ML IJ SOLN
2.5000 mg | Freq: Once | INTRAMUSCULAR | Status: AC
  Administered 2015-10-22: 2.5 mg via INTRAVENOUS
  Filled 2015-10-22: qty 1

## 2015-10-22 NOTE — Progress Notes (Signed)
CSW met with patient and daughter Consuello Masse at bedside to discuss bed offers. Both patient and daughter chose [facility]. CSW faxed FL2 and Therapy notes to facility through hub. CSW will continue to follow.    Anitra Lauth, BSW, MSW, [facility] Work Dept [phone]

## 2015-10-22 NOTE — Progress Notes (Signed)
  Subjective: 1 Day Post-Op Procedure(s) (LRB): INTRAMEDULLARY (IM) NAIL INTERTROCHANTRIC LEFT LONG AFFIXUS (Left) Patient reports pain as mild.   Patient seen in rounds with Dr. Rudene Christians. Patient is well, and has had no acute complaints or problems Plan is to go Skilled nursing facility after hospital stay. Negative for chest pain and shortness of breath Fever: no Gastrointestinal: Negative for nausea and vomiting  Objective: Vital signs in last 24 hours: Temp:  [95.7 F (35.4 C)-99.2 F (37.3 C)] 97.8 F (36.6 C) (03/25 0436) Pulse Rate:  [52-68] 65 (03/25 0436) Resp:  [15-19] 17 (03/25 0436) BP: (92-135)/(39-71) 135/60 mmHg (03/25 0436) SpO2:  [94 %-100 %] 97 % (03/25 0436) Weight:  [58.06 kg (128 lb)] 58.06 kg (128 lb) (03/24 1104)  Intake/Output from previous day:  Intake/Output Summary (Last 24 hours) at 10/22/15 0719 Last data filed at 10/22/15 0630  Gross per 24 hour  Intake   3675 ml  Output   1775 ml  Net   1900 ml    Intake/Output this shift:    Labs:  Recent Labs  10/20/15 1726 10/21/15 0559 10/22/15 0415  HGB 11.8* 9.7* 8.7*    Recent Labs  10/21/15 0559 10/22/15 0415  WBC 6.0 6.8  RBC 3.24* 2.89*  HCT 28.4* 25.4*  PLT 175 143*    Recent Labs  10/21/15 0559 10/22/15 0415  NA 134* 135  K 4.5 4.4  CL 103 107  CO2 27 25  BUN 29* 23*  CREATININE 0.92 0.83  GLUCOSE 229* 208*  CALCIUM 8.4* 8.1*    Recent Labs  10/20/15 1726  INR 1.23     EXAM General - Patient is Alert and Oriented Extremity - Neurovascular intact Dorsiflexion/Plantar flexion intact Compartment soft Dressing/Incision - clean, dry, no drainage Motor Function - intact, moving foot and toes well on exam.   Past Medical History  Diagnosis Date  . Diabetes mellitus without complication ([place])   . Restless leg syndrome   . Neuropathy ([place])   . Depression   . Osteoporosis   . Tremor   . Hypertension   . Stroke ([place])     Assessment/Plan: 1 Day Post-Op  Procedure(s) (LRB): INTRAMEDULLARY (IM) NAIL INTERTROCHANTRIC LEFT LONG AFFIXUS (Left) Active Problems:   Intertrochanteric fracture of left hip (H[facility])   Hyponatremia   Sinus tachycardia (H[facility])   Chronic renal insufficiency   Anemia   Closed left hip fracture ([facility])  Estimated body mass index is 20.67 kg/(m^2) as calculated from the following:   Height as of this encounter: 5\' 6"  (1.676 m).   Weight as of this encounter: 58.06 kg (128 lb). Advance diet Up with therapy D/C IV fluids Discharge to SNF when cleared medically.   DVT Prophylaxis - Lovenox, Foot Pumps and TED hose Weight-Bearing as tolerated to left leg  Reche Dixon, PA-C Orthopaedic Surgery 10/22/2015, 7:20 AM

## 2015-10-22 NOTE — Progress Notes (Addendum)
Physical Therapy Treatment Patient Details Name: Carrie Bishop MRN: [id number] DOB: 12-05-1928 Today's Date: 10/22/2015    History of Present Illness Pt is a 82 y.o female who had a fall and sustained left hip Fx     PT Comments    Patient agrees to try to stand up at the bedside. She participates in gentle PROM to LLE but screams out with any movement to LLE.  She requires max assist for supine to sit bed mobility and is able to perform sitting at edge of bed with support of UE. She is not able to stand with max assist from PT but does try and follow instructions for safe hand placement. She is screaming out in pain throughout treatment. Pt will continue to progress plan of care.   Follow Up Recommendations  SNF     Equipment Recommendations       Recommendations for Other Services       Precautions / Restrictions Precautions Precautions: Fall Restrictions Weight Bearing Restrictions: Yes LLE Weight Bearing: Weight bearing as tolerated    Mobility  Bed Mobility Overal bed mobility: +2 for physical assistance             General bed mobility comments: max assist for supien to sit  Transfers Overall transfer level: Needs assistance Equipment used: Rolling walker (2 wheeled) Transfers: Sit to/from Stand Sit to Stand: Max assist         General transfer comment: unable to perform sit to stand  Ambulation/Gait     Assistive device:  (unable to ambulate)       General Gait Details: unable to ambulate   Stairs            Wheelchair Mobility    Modified Rankin (Stroke Patients Only)       Balance                                    Cognition Arousal/Alertness: Awake/alert Behavior During Therapy: Anxious Overall Cognitive Status: History of cognitive impairments - at baseline                      Exercises Other Exercises Other Exercises: AAROM LLE    General Comments        Pertinent Vitals/Pain Pain  Assessment: 0-10 Pain Score:  (unable to rate her pain) Pain Location: left leg Pain Descriptors / Indicators:  (unable to describe) Pain Intervention(s): Limited activity within patient's tolerance    Home Living Family/patient expects to be discharged to:: Private residence Living Arrangements: Other relatives Available Help at Discharge: Family Type of Home: Mobile home Home Access: Ramped entrance   Home Layout: One level Home Equipment: Environmental consultant - 2 wheels      Prior Function Level of Independence: Independent with assistive device(s)    ADL's / Homemaking Assistance Needed: Pt reports she does not do household tasks     PT Goals (current goals can now be found in the care plan section) Acute Rehab PT Goals Patient Stated Goal: to go home , to decrease pain  PT Goal Formulation: With patient Time For Goal Achievement: 11/04/15 Potential to Achieve Goals: Fair Progress towards PT goals: Not progressing toward goals - comment    Frequency  BID    PT Plan Current plan remains appropriate    Co-evaluation             End of Session  Equipment Utilized During Treatment: Gait belt Activity Tolerance: Patient limited by pain Patient left: in bed     Time: 1310-1349 PT Time Calculation (min) (ACUTE ONLY): 39 min  Charges:  $Therapeutic Activity: 23-37 mins                    G Codes:     Alanson Puls, PT, DPT [place], Minette Headland S 10/22/2015, 1:53 PM

## 2015-10-22 NOTE — Progress Notes (Signed)
Pts foley removed this am by night shift, Pt DTV by 1500. No urine noted, pt bladder scanned, 404 ml.  MD [place] notified. Orders received for in and out cath X1.  In and out done, 625 ml drained.

## 2015-10-22 NOTE — Progress Notes (Signed)
Pts BP 101/44, MD Sainani notified at the nurses station. No orders received.

## 2015-10-22 NOTE — Progress Notes (Signed)
Occupational Therapy Evaluation Patient Details Name: Carrie Bishop MRN: [id number] DOB: [birth year] Today's Date: 10/22/2015    History of Present Illness Pt is a 82 y.o female who had a fall and sustained left hip Fx    Clinical Impression   Pt very anxious about moving L-LE , Pt's evaluation limited by reports of LE pain     Follow Up Recommendations  SNF    Equipment Recommendations       Recommendations for Other Services PT consult     Precautions / Restrictions Restrictions Weight Bearing Restrictions: Yes LLE Weight Bearing: Weight bearing as tolerated      Mobility Bed Mobility Overall bed mobility:  (max assist with bed mobility this am to reposition in bed )                Transfers                      Balance                                            ADL Overall ADL's : Needs assistance/impaired Eating/Feeding: Independent   Grooming:  (set up for UB self care in sitting) with verbal cues/ encouragement            Upper Body Dressing : Set up;Supervision/safety, with constant verbal cues                      General ADL Comments: Pt needs set up  and supervision for UB self care /grooming   and constant verbal cues/ redirection to tasks . Pt has UE tremor left hand .      Vision     Perception     Praxis      Pertinent Vitals/Pain Pain Assessment: 0-10 Pain Score: 8  Pain Location: left LE Pain Descriptors / Indicators: Crying (Pt has hx of LE pain per patient family )     Hand Dominance Right   Extremity/Trunk Assessment Upper Extremity Assessment Upper Extremity Assessment: Overall WFL for tasks assessed           Communication Communication Communication: No difficulties   Cognition Arousal/Alertness: Awake/alert Behavior During Therapy: Anxious (fearful of moving LE) Overall Cognitive Status: History of cognitive impairments - at baseline                      General Comments   Pt needs constant verbal cues/encouragement/redirection  to engage in activities . Pt very anxious and not wanting to move    Exercises       Shoulder Instructions      Home Living Family/patient expects to be discharged to:: Private residence Living Arrangements: Other relatives (Adult daughter and family ) Available Help at Discharge: Family Type of Home: Mobile home       Home Layout: One level     Bathroom Shower/Tub: Teacher, early years/pre: Standard Bathroom Accessibility: Yes   Home Equipment: Bedside commode;Cane - single point;Walker - 2 wheels;Shower seat;Wheelchair - manual          Prior Functioning/Environment Level of Independence:  (modified independent with BADL)    ADL's / Homemaking Assistance Needed: Pt reports she does not do household tasks        OT Diagnosis: Generalized weakness   OT Problem List: Decreased  strength;Decreased activity tolerance;Decreased knowledge of use of DME or AE;Decreased knowledge of precautions;Pain;Other (comment) (decrease independence in LB/LE ADLs)   OT Treatment/Interventions: Self-care/ADL training;Therapeutic exercise;Therapeutic activities;Energy conservation;DME and/or AE instruction;Patient/family education    OT Goals(Current goals can be found in the care plan section) Acute Rehab OT Goals Patient Stated Goal: to go home , to decrease pain  OT Goal Formulation: With patient  OT Frequency: Min 1X/week   Barriers to D/C:            Co-evaluation              End of Session    Activity Tolerance: Patient limited by pain;Other (comment) (Pt very anxious to do any LE movement) Patient left: in bed;with call bell/phone within reach;with family/visitor present   Time: [id number] OT Time Calculation (min): 25 min Charges:  OT Evaluation $OT Eval Low Complexity: 1 Procedure G-Codes: OT G-codes **NOT FOR INPATIENT CLASS** Functional Limitation: Self care Self Care  Current Status [id number]): At least 20 percent but less than 40 percent impaired, limited or restricted Self Care Goal Status [id number]): At least 40 percent but less than 60 percent impaired, limited or restricted  Cavin Longman W 10/22/2015, 10:02 AM

## 2015-10-22 NOTE — Evaluation (Signed)
Physical Therapy Evaluation Patient Details Name: Carrie Bishop MRN: [id number] DOB: [birth year] Today's Date: 10/22/2015   History of Present Illness  Pt is a 82 y.o female who had a fall and sustained left hip Fx   Clinical Impression  Patient is 82 yr old female who was ambulating without AD indoors and with Rw when she left her home. She lives with her children and has a ramp into her home. She has high anxiety and pain level in LLE that limits her ability to participate in therapy. She is max assist for bed mobility supine to sit at EOB and needs max assist for sit to supine bed mobility. She is screaming throughout her entire therapy session. She will benefit from skilled PT to improve functional mobility and strength.     Follow Up Recommendations SNF    Equipment Recommendations       Recommendations for Other Services       Precautions / Restrictions Precautions Precautions: Fall Restrictions Weight Bearing Restrictions: Yes LLE Weight Bearing: Weight bearing as tolerated      Mobility  Bed Mobility Overal bed mobility: +2 for physical assistance             General bed mobility comments: max assist for supien to sit  Transfers Overall transfer level:  (unable to perform sit to stand)               General transfer comment: unable to perform sit to stand  Ambulation/Gait             General Gait Details: unable to ambulate  Stairs            Wheelchair Mobility    Modified Rankin (Stroke Patients Only)       Balance                                             Pertinent Vitals/Pain Pain Assessment: 0-10 Pain Score: 10-Worst pain ever Pain Location:  (left leg) Pain Descriptors / Indicators: Crying Pain Intervention(s): Limited activity within patient's tolerance    Home Living Family/patient expects to be discharged to:: Private residence Living Arrangements: Other relatives Available Help at Discharge:  Family Type of Home: Mobile home Home Access: Ramped entrance     Home Layout: One level Home Equipment: Environmental consultant - 2 wheels      Prior Function Level of Independence: Independent with assistive device(s)      ADL's / Homemaking Assistance Needed: Pt reports she does not do household tasks        Hand Dominance   Dominant Hand: Right    Extremity/Trunk Assessment   Upper Extremity Assessment: Defer to OT evaluation           Lower Extremity Assessment: Generalized weakness      Cervical / Trunk Assessment: Normal  Communication   Communication: No difficulties  Cognition Arousal/Alertness: Awake/alert Behavior During Therapy: Anxious Overall Cognitive Status: History of cognitive impairments - at baseline                      General Comments      Exercises Other Exercises Other Exercises: AAROM LLE      Assessment/Plan    PT Assessment Patient needs continued PT services  PT Diagnosis Difficulty walking   PT Problem List Decreased strength;Decreased range of motion;Decreased activity tolerance;Decreased mobility;Decreased  knowledge of use of DME;Decreased safety awareness;Pain  PT Treatment Interventions Gait training;Functional mobility training;Therapeutic activities;Therapeutic exercise   PT Goals (Current goals can be found in the Care Plan section) Acute Rehab PT Goals Patient Stated Goal: to go home , to decrease pain  PT Goal Formulation: With patient Time For Goal Achievement: 11/04/15 Potential to Achieve Goals: Fair    Frequency BID   Barriers to discharge Decreased caregiver support      Co-evaluation               End of Session   Activity Tolerance: Patient limited by pain Patient left: in bed           Time: 1110-1140 PT Time Calculation (min) (ACUTE ONLY): 30 min   Charges:   PT Evaluation $PT Eval Moderate Complexity: 1 Procedure PT Treatments $Therapeutic Activity: 8-22 mins   PT G Codes:       Alanson Puls, PT, DPT  [place], Minette Headland S 10/22/2015, 11:45 AM

## 2015-10-22 NOTE — Progress Notes (Signed)
Patient ID: DIMPLES DUKETTE, female   DOB: [birth year], 82 y.o.   MRN: [id number] [place] [facility]   PATIENT NAME: Carrie Bishop    MR#:  [id number]  DATE OF BIRTH:  [birth year]  SUBJECTIVE:   Status post left hip replacement. Postop day #1. Patient was a bit agitated and confused this morning but improved. Daughter at bedside. Seen by physical therapy and likely to rehabilitation next week.  REVIEW OF SYSTEMS:   Review of Systems  Constitutional: Negative for fever, chills and weight loss.  HENT: Negative for ear discharge, ear pain and nosebleeds.   Eyes: Negative for blurred vision, pain and discharge.  Respiratory: Negative for sputum production, shortness of breath, wheezing and stridor.   Cardiovascular: Negative for chest pain, palpitations, orthopnea and PND.  Gastrointestinal: Negative for nausea, vomiting, abdominal pain and diarrhea.  Genitourinary: Negative for urgency and frequency.  Musculoskeletal: Positive for joint pain and falls. Negative for back pain.  Neurological: Negative for sensory change, speech change, focal weakness and weakness.  Psychiatric/Behavioral: Negative for depression and hallucinations. The patient is not nervous/anxious.   All other systems reviewed and are negative.  Tolerating Diet: Heart healthy  Tolerating PT: Evaluation noted  DRUG ALLERGIES:  No Known Allergies  VITALS:  Blood pressure 101/44, pulse 63, temperature 97.8 F (36.6 C), temperature source Oral, resp. rate 16, height 5\' 6"  (1.676 m), weight 58.06 kg (128 lb), SpO2 97 %.  PHYSICAL EXAMINATION:   Physical Exam  GENERAL:  82 y.o.-year-old patient lying in the bed with no acute distress.  EYES: Pupils equal, round, reactive to light and accommodation. No scleral icterus. Extraocular muscles intact.  HEENT: Head atraumatic, normocephalic. Oropharynx and nasopharynx clear.  NECK:  Supple, no jugular venous distention. No thyroid  enlargement, no tenderness.  LUNGS: Normal breath sounds bilaterally, no wheezing, rales, rhonchi. No use of accessory muscles of respiration.  CARDIOVASCULAR: S1, S2 normal. No murmurs, rubs, or gallops.  ABDOMEN: Soft, nontender, nondistended. Bowel sounds present. No organomegaly or mass.  EXTREMITIES: No cyanosis, clubbing or edema b/l.   Left Hip dressing in place with no drainage or bleeding.  NEUROLOGIC: Cranial nerves II through XII are intact. No focal Motor or sensory deficits b/l.   PSYCHIATRIC:  patient is alert and oriented x 2.  Good affect.  SKIN: No obvious rash, lesion, or ulcer.   LABORATORY PANEL:  CBC  Recent Labs Lab 10/22/15 0415  WBC 6.8  HGB 8.7*  HCT 25.4*  PLT 143*    Chemistries   Recent Labs Lab 10/20/15 1726  10/22/15 0415  NA 134*  < > 135  K 5.0  < > 4.4  CL 98*  < > 107  CO2 26  < > 25  GLUCOSE 265*  < > 208*  BUN 24*  < > 23*  CREATININE 1.37*  < > 0.83  CALCIUM 9.6  < > 8.1*  AST 23  --   --   ALT 14  --   --   ALKPHOS 71  --   --   BILITOT 1.0  --   --   < > = values in this interval not displayed. Cardiac Enzymes No results for input(s): TROPONINI in the last 168 hours. RADIOLOGY:  Dg Chest 1 View  10/20/2015  CLINICAL DATA:  Preop for hip fracture EXAM: CHEST 1 VIEW COMPARISON:  Eleven/1/14 FINDINGS: Cardiomediastinal silhouette is stable. No acute infiltrate or pleural effusion. No pulmonary edema. Atherosclerotic calcifications of  thoracic aorta again noted. Mild elevation of the right hemidiaphragm again noted. IMPRESSION: No active disease. Electronically Signed   By: Lahoma Crocker M.D.   On: 10/20/2015 17:12   Dg Hip Operative Unilat W Or W/o Pelvis Left  10/21/2015  CLINICAL DATA:  Left femur fracture EXAM: OPERATIVE LEFT HIP (WITH PELVIS IF PERFORMED) 8 VIEWS TECHNIQUE: Fluoroscopic spot image(s) were submitted for interpretation post-operatively. COMPARISON:  10/20/2015 FINDINGS: Images demonstrate placement of a dynamic  compression screw and intra medullary rod transfixing an intertrochanteric left femur fracture. There is 1 distal interlocking screw. There is no breakage or loosening of the hardware. Anatomic alignment of the osseous fragments. IMPRESSION: ORIF intertrochanteric left femur fracture. Electronically Signed   By: Marybelle Killings M.D.   On: 10/21/2015 12:55   Dg Hip Unilat With Pelvis 2-3 Views Left  10/20/2015  CLINICAL DATA:  Fall, left hip pain EXAM: DG HIP (WITH OR WITHOUT PELVIS) 2-3V LEFT COMPARISON:  None. FINDINGS: Three views of the left hip submitted. There is mild impacted with minimal angulation intertrochanteric fracture of proximal left femur. IMPRESSION: Mild impacted minimal angulated intertrochanteric fracture of proximal left femur. Electronically Signed   By: Lahoma Crocker M.D.   On: 10/20/2015 17:15   ASSESSMENT AND PLAN:  Carrie Bishop is a 82 y.o. female with a known history of diabetes, neuropathy, hypertension, stroke,urinary incontinence, RLS came to the ER after her leg gave out and had a mechanical fall at home and noted to have a left hip fracture  #1. Intertrochanteric fracture of left hip-patient is status post open reduction internal fixation of the left hip. Postop day #1 today. -Continue pain control with as needed oxycodone. DC morphine as it caused some confusion. -Appreciate orthopedics, physical therapy input. Likely discharge to skilled nursing facility early next week.  #2. Hyponatremia-stable. Asymptomatic. -Continue gentle IV fluids.  #3. Acute renal failure-improved with IV fluid hydration and will monitor.  #4 altered mental status/delirium-this was due to pain medications/IV morphine, IV Valium. -Patient received some as needed Haldol and her mental status is improved. Avoid narcotics for now. Avoid benzodiazepines. -Follow mental status  #5 essential hypertension-continue propranolol, lisinopril.  #6 depression-continue Cymbalta.  #7 Essential tremors -  continue propranolol  Case discussed with Care Management/Social Worker. Management plans discussed with the patient, family and they are in agreement.  CODE STATUS: full code  DVT Prophylaxis: lovenox  TOTAL TIME TAKING CARE OF THIS PATIENT: 25 minutes.   POSSIBLE D/C IN 1-2 DAYS, DEPENDING ON CLINICAL CONDITION.  Note: This dictation was prepared with Dragon dictation along with smaller phrase technology. Any transcriptional errors that result from this process are unintentional.  Henreitta Leber M.D on 10/22/2015 at 1:28 PM  Between 7am to 6pm - Pager - [phone]  After 6pm go to [email or web address] - password [email or web address] [facility]  [facility]  Office  [phone]  CC: Primary care physician; No primary care provider on file.

## 2015-10-23 LAB — GLUCOSE, CAPILLARY
GLUCOSE-CAPILLARY: 278 mg/dL — AB (ref 65–99)
GLUCOSE-CAPILLARY: 235 mg/dL — AB (ref 65–99)
GLUCOSE-CAPILLARY: 189 mg/dL — AB (ref 65–99)
Glucose-Capillary: 170 mg/dL — ABNORMAL HIGH (ref 65–99)
Glucose-Capillary: 320 mg/dL — ABNORMAL HIGH (ref 65–99)

## 2015-10-23 LAB — HEMOGLOBIN A1C: Hemoglobin A1C: 8.7

## 2015-10-23 LAB — HEMOGLOBIN: HEMOGLOBIN: 8.7 g/dL — AB (ref 12.0–16.0)

## 2015-10-23 MED ORDER — ENOXAPARIN SODIUM 40 MG/0.4ML ~~LOC~~ SOLN: 40.0000 mg | SUBCUTANEOUS | Status: DC

## 2015-10-23 MED ORDER — HYDROCODONE-ACETAMINOPHEN 5-325 MG PO TABS: 1.0000 | ORAL_TABLET | ORAL | Status: DC | PRN

## 2015-10-23 NOTE — Progress Notes (Signed)
CSW spoke with Staff in Admissions at [facility] regarding new referral for SNF placement. Staff reported patient has been accepted,  FL2 was received. Facility will be waiting on patient admission once patient is cleared. CSW notified Dr Prescott Parma. CSW will continue to follow.    Anitra Lauth, BSW, MSW, [place] [facility] [phone]

## 2015-10-23 NOTE — Progress Notes (Signed)
MD paged to notify of bladder scan results of 727 and pt unable to void. Awaiting returned call

## 2015-10-23 NOTE — Progress Notes (Signed)
MD paged again. Awaiting call

## 2015-10-23 NOTE — Progress Notes (Signed)
Physical Therapy Treatment Patient Details Name: Carrie Bishop MRN: [id number] DOB: [birth year] Today's Date: 10/23/2015    History of Present Illness Pt is a 82 y.o female who had a fall and sustained left hip Fx , needing an ORIF    PT Comments    Pt is able to participate a little better today but is still self limiting due to pain and appears to have some confusion with all but he most simple cues/instruction.  Pt unable to truly take anything that could be considered a step and is very guarded and unsafe with standing balance. Pt dependent assist to get to recliner.  Follow Up Recommendations  SNF     Equipment Recommendations       Recommendations for Other Services       Precautions / Restrictions Precautions Precautions: Fall Restrictions LLE Weight Bearing: Weight bearing as tolerated    Mobility  Bed Mobility Overal bed mobility: Needs Assistance Bed Mobility: Sidelying to Sit   Sidelying to sit: Mod assist;Max assist       General bed mobility comments: Pt fearful and pain limited with just sitting at EOB.  With assist and cuing she was able to maintain sitting balance with 1 UE and only CGA, but is clearly uncomfortable the entire time.   Transfers Overall transfer level: Needs assistance Equipment used: Rolling walker (2 wheeled) Transfers: Sit to/from Stand Sit to Stand: Total assist;Max assist         General transfer comment: Pt has a very hard time intitiating getting to standing, she needs mod assist to remain standing but does use the walker well for static standing  Ambulation/Gait             General Gait Details: Pt unable to safety take any steps, she did shuffle the R foot ~1" X 2 and was able to slide the L foot back the same amount at 1 point with excessive assist and cuing.    Stairs            Wheelchair Mobility    Modified Rankin (Stroke Patients Only)       Balance Overall balance assessment: Needs  assistance   Sitting balance-Leahy Scale: Poor       Standing balance-Leahy Scale: Zero                      Cognition Arousal/Alertness: Awake/alert Behavior During Therapy: Anxious Overall Cognitive Status: History of cognitive impairments - at baseline                      Exercises General Exercises - Lower Extremity Ankle Circles/Pumps: 10 reps;AROM Quad Sets: Strengthening;10 reps Gluteal Sets: Strengthening;10 reps Short Arc Quad: AAROM;10 reps Heel Slides: Strengthening;AAROM;10 reps Hip ABduction/ADduction: AROM;AAROM;10 reps    General Comments        Pertinent Vitals/Pain Pain Assessment: 0-10 Pain Score: 10-Worst pain ever    Home Living                      Prior Function            PT Goals (current goals can now be found in the care plan section) Progress towards PT goals: Progressing toward goals    Frequency  BID    PT Plan Current plan remains appropriate    Co-evaluation             End of Session Equipment Utilized During Treatment: Gait belt  Activity Tolerance: Patient limited by pain Patient left: with chair alarm set;with call bell/phone within reach     Time: 1033-1059 PT Time Calculation (min) (ACUTE ONLY): 26 min  Charges:  $Therapeutic Exercise: 8-22 mins $Therapeutic Activity: 8-22 mins                    G Codes:      Wayne Both, PT, DPT [phone]  Kreg Shropshire 10/23/2015, 12:55 PM

## 2015-10-23 NOTE — Progress Notes (Signed)
  Subjective: 2 Days Post-Op Procedure(s) (LRB): INTRAMEDULLARY (IM) NAIL INTERTROCHANTRIC LEFT LONG AFFIXUS (Left) Patient reports pain as mild.   Patient seen in rounds with Dr. Rudene Christians. Patient is well, and has had no acute complaints or problems Plan is to go Skilled nursing facility after hospital stay. The patient did not ambulate with physical therapy. Negative for chest pain and shortness of breath Fever: no Gastrointestinal: Negative for nausea and vomiting  Objective: Vital signs in last 24 hours: Temp:  [97.6 F (36.4 C)-98.6 F (37 C)] 97.8 F (36.6 C) (03/26 0345) Pulse Rate:  [57-66] 58 (03/26 0345) Resp:  [16-18] 16 (03/26 0345) BP: (101-134)/(44-65) 134/52 mmHg (03/26 0345) SpO2:  [96 %-100 %] 96 % (03/26 0345)  Intake/Output from previous day:  Intake/Output Summary (Last 24 hours) at 10/23/15 0619 Last data filed at 10/22/15 1900  Gross per 24 hour  Intake   2695 ml  Output   1000 ml  Net   1695 ml    Intake/Output this shift:    Labs:  Recent Labs  10/20/15 1726 10/21/15 0559 10/22/15 0415 10/23/15 0326  HGB 11.8* 9.7* 8.7* 8.7*    Recent Labs  10/21/15 0559 10/22/15 0415  WBC 6.0 6.8  RBC 3.24* 2.89*  HCT 28.4* 25.4*  PLT 175 143*    Recent Labs  10/21/15 0559 10/22/15 0415  NA 134* 135  K 4.5 4.4  CL 103 107  CO2 27 25  BUN 29* 23*  CREATININE 0.92 0.83  GLUCOSE 229* 208*  CALCIUM 8.4* 8.1*    Recent Labs  10/20/15 1726  INR 1.23     EXAM General - Patient is Alert and Oriented Extremity - Neurovascular intact Dorsiflexion/Plantar flexion intact Compartment soft Dressing/Incision - clean, dry, no drainage Motor Function - intact, moving foot and toes well on exam.   Past Medical History  Diagnosis Date  . Diabetes mellitus without complication ([place])   . Restless leg syndrome   . Neuropathy ([place])   . Depression   . Osteoporosis   . Tremor   . Hypertension   . Stroke ([place])     Assessment/Plan: 2 Days  Post-Op Procedure(s) (LRB): INTRAMEDULLARY (IM) NAIL INTERTROCHANTRIC LEFT LONG AFFIXUS (Left) Active Problems:   Intertrochanteric fracture of left hip ([facility])   Hyponatremia   Sinus tachycardia ([facility])   Chronic renal insufficiency   Anemia   Closed left hip fracture ([facility])  Estimated body mass index is 20.67 kg/(m^2) as calculated from the following:   Height as of this encounter: 5\' 6"  (1.676 m).   Weight as of this encounter: 58.06 kg (128 lb). Advance diet Up with therapy D/C IV fluids Discharge to SNF when cleared medically.  Continue physical therapy with range of motion and ambulation.  DVT Prophylaxis - Lovenox, Foot Pumps and TED hose Weight-Bearing as tolerated to left leg  Reche Dixon, PA-C Orthopaedic Surgery 10/23/2015, 6:19 AM

## 2015-10-23 NOTE — Progress Notes (Signed)
MD returned page. Place foley cath and begin bladder training

## 2015-10-23 NOTE — Progress Notes (Signed)
Going to place foley, pt had incontinent episode. Bed saturated.

## 2015-10-23 NOTE — Progress Notes (Signed)
Patient ID: SHIRLI LAMARTINA, female   DOB: [birth year], 82 y.o.   MRN: [id number] [place] [facility]   PATIENT NAME: Carrie Bishop    MR#:  [id number]  DATE OF BIRTH:  1928-11-28  SUBJECTIVE:   Status post left hip replacement. Postop day #2.  Mental status much improved since yesterday.  No acute events overnight. Tolerating PT.   REVIEW OF SYSTEMS:   Review of Systems  Constitutional: Negative for fever, chills and weight loss.  HENT: Negative for ear discharge, ear pain and nosebleeds.   Eyes: Negative for blurred vision, pain and discharge.  Respiratory: Negative for sputum production, shortness of breath, wheezing and stridor.   Cardiovascular: Negative for chest pain, palpitations, orthopnea and PND.  Gastrointestinal: Negative for nausea, vomiting, abdominal pain and diarrhea.  Genitourinary: Negative for urgency and frequency.  Musculoskeletal: Positive for joint pain and falls. Negative for back pain.  Neurological: Negative for sensory change, speech change, focal weakness and weakness.  Psychiatric/Behavioral: Negative for depression and hallucinations. The patient is not nervous/anxious.   All other systems reviewed and are negative.  Tolerating Diet: Heart healthy  Tolerating PT: Evaluation noted  DRUG ALLERGIES:  No Known Allergies  VITALS:  Blood pressure 138/54, pulse 57, temperature 98.1 F (36.7 C), temperature source Oral, resp. rate 16, height 5\' 6"  (1.676 m), weight 58.06 kg (128 lb), SpO2 95 %.  PHYSICAL EXAMINATION:   Physical Exam  GENERAL:  82 y.o.-year-old patient lying in the bed in no acute distress.  EYES: Pupils equal, round, reactive to light and accommodation. No scleral icterus. Extraocular muscles intact.  HEENT: Head atraumatic, normocephalic. Oropharynx and nasopharynx clear.  NECK:  Supple, no jugular venous distention. No thyroid enlargement, no tenderness.  LUNGS: Normal breath sounds  bilaterally, no wheezing, rales, rhonchi. No use of accessory muscles of respiration.  CARDIOVASCULAR: S1, S2 normal. No murmurs, rubs, or gallops.  ABDOMEN: Soft, nontender, nondistended. Bowel sounds present. No organomegaly or mass.  EXTREMITIES: No cyanosis, clubbing or edema b/l.   Left Hip dressing in place with no drainage or bleeding.  NEUROLOGIC: Cranial nerves II through XII are intact. No focal Motor or sensory deficits b/l.   PSYCHIATRIC:  patient is alert and oriented x 3.  Good affect.  SKIN: No obvious rash, lesion, or ulcer.   LABORATORY PANEL:  CBC  Recent Labs Lab 10/22/15 0415 10/23/15 0326  WBC 6.8  --   HGB 8.7* 8.7*  HCT 25.4*  --   PLT 143*  --     Chemistries   Recent Labs Lab 10/20/15 1726  10/22/15 0415  NA 134*  < > 135  K 5.0  < > 4.4  CL 98*  < > 107  CO2 26  < > 25  GLUCOSE 265*  < > 208*  BUN 24*  < > 23*  CREATININE 1.37*  < > 0.83  CALCIUM 9.6  < > 8.1*  AST 23  --   --   ALT 14  --   --   ALKPHOS 71  --   --   BILITOT 1.0  --   --   < > = values in this interval not displayed. Cardiac Enzymes No results for input(s): TROPONINI in the last 168 hours. RADIOLOGY:  No results found. ASSESSMENT AND PLAN:   Carrie Bishop is a 82 y.o. female with a known history of diabetes, neuropathy, hypertension, stroke,urinary incontinence, RLS came to the ER after her leg gave out  and had a mechanical fall at home and noted to have a left hip fracture  #1. Intertrochanteric fracture of left hip-patient is status post open reduction internal fixation of the left hip. Postop day #2 today. -Continue pain control with as needed oxycodone.  -Appreciate orthopedics, physical therapy input. Likely discharge to skilled nursing facility tomorrow.  #2. Hyponatremia-stable. Asymptomatic. - improved w/ IV fluids.   #3. Acute renal failure- Resolved w/ IV fluids.   #4 altered mental status/delirium-this was due to pain medications/IV morphine, IV  Valium. - improved w/ PRN haldol. Avoid narcotics, benzodiazepines. -Follow mental status   #5 essential hypertension-continue propranolol, lisinopril.  #6 depression-continue Cymbalta.  #7 Essential tremors - continue propranolol  D/c to SNF tomorrow.   Case discussed with Care Management/Social Worker. Management plans discussed with the patient, family and they are in agreement.  CODE STATUS: full code  DVT Prophylaxis: lovenox  TOTAL TIME TAKING CARE OF THIS PATIENT: 25 minutes.   POSSIBLE D/C IN 1-2 DAYS, DEPENDING ON CLINICAL CONDITION.  Note: This dictation was prepared with Dragon dictation along with smaller phrase technology. Any transcriptional errors that result from this process are unintentional.  Henreitta Leber M.D on 10/23/2015 at 2:47 PM  Between 7am to 6pm - Pager - [phone]  After 6pm go to [email or web address] - password [email or web address] [facility]  [facility]  Office  [phone]  CC: Primary care physician; No primary care provider on file.

## 2015-10-24 DIAGNOSIS — S72002A Fracture of unspecified part of neck of left femur, initial encounter for closed fracture: Secondary | ICD-10-CM | POA: Diagnosis not present

## 2015-10-24 DIAGNOSIS — I10 Essential (primary) hypertension: Secondary | ICD-10-CM | POA: Diagnosis not present

## 2015-10-24 DIAGNOSIS — Z4789 Encounter for other orthopedic aftercare: Secondary | ICD-10-CM | POA: Diagnosis not present

## 2015-10-24 DIAGNOSIS — Z9181 History of falling: Secondary | ICD-10-CM | POA: Diagnosis not present

## 2015-10-24 DIAGNOSIS — M810 Age-related osteoporosis without current pathological fracture: Secondary | ICD-10-CM | POA: Diagnosis not present

## 2015-10-24 DIAGNOSIS — E871 Hypo-osmolality and hyponatremia: Secondary | ICD-10-CM | POA: Diagnosis not present

## 2015-10-24 DIAGNOSIS — S72002D Fracture of unspecified part of neck of left femur, subsequent encounter for closed fracture with routine healing: Secondary | ICD-10-CM | POA: Diagnosis not present

## 2015-10-24 DIAGNOSIS — N189 Chronic kidney disease, unspecified: Secondary | ICD-10-CM | POA: Diagnosis not present

## 2015-10-24 DIAGNOSIS — G250 Essential tremor: Secondary | ICD-10-CM | POA: Diagnosis not present

## 2015-10-24 DIAGNOSIS — D649 Anemia, unspecified: Secondary | ICD-10-CM | POA: Diagnosis not present

## 2015-10-24 DIAGNOSIS — G4700 Insomnia, unspecified: Secondary | ICD-10-CM | POA: Diagnosis not present

## 2015-10-24 DIAGNOSIS — R262 Difficulty in walking, not elsewhere classified: Secondary | ICD-10-CM | POA: Diagnosis not present

## 2015-10-24 DIAGNOSIS — M6281 Muscle weakness (generalized): Secondary | ICD-10-CM | POA: Diagnosis not present

## 2015-10-24 DIAGNOSIS — E1121 Type 2 diabetes mellitus with diabetic nephropathy: Secondary | ICD-10-CM | POA: Diagnosis not present

## 2015-10-24 DIAGNOSIS — F329 Major depressive disorder, single episode, unspecified: Secondary | ICD-10-CM | POA: Diagnosis not present

## 2015-10-24 DIAGNOSIS — D508 Other iron deficiency anemias: Secondary | ICD-10-CM | POA: Diagnosis not present

## 2015-10-24 DIAGNOSIS — S72002S Fracture of unspecified part of neck of left femur, sequela: Secondary | ICD-10-CM | POA: Diagnosis not present

## 2015-10-24 DIAGNOSIS — R278 Other lack of coordination: Secondary | ICD-10-CM | POA: Diagnosis not present

## 2015-10-24 DIAGNOSIS — R2681 Unsteadiness on feet: Secondary | ICD-10-CM | POA: Diagnosis not present

## 2015-10-24 DIAGNOSIS — R4182 Altered mental status, unspecified: Secondary | ICD-10-CM | POA: Diagnosis not present

## 2015-10-24 DIAGNOSIS — S72145D Nondisplaced intertrochanteric fracture of left femur, subsequent encounter for closed fracture with routine healing: Secondary | ICD-10-CM | POA: Diagnosis not present

## 2015-10-24 DIAGNOSIS — D62 Acute posthemorrhagic anemia: Secondary | ICD-10-CM | POA: Diagnosis not present

## 2015-10-24 DIAGNOSIS — R000 Tachycardia, unspecified: Secondary | ICD-10-CM | POA: Diagnosis not present

## 2015-10-24 DIAGNOSIS — M792 Neuralgia and neuritis, unspecified: Secondary | ICD-10-CM | POA: Diagnosis not present

## 2015-10-24 LAB — GLUCOSE, CAPILLARY
GLUCOSE-CAPILLARY: 222 mg/dL — AB (ref 65–99)
Glucose-Capillary: 313 mg/dL — ABNORMAL HIGH (ref 65–99)

## 2015-10-24 MED ORDER — DOCUSATE SODIUM 100 MG PO CAPS: 100.0000 mg | ORAL_CAPSULE | Freq: Two times a day (BID) | ORAL | Status: DC

## 2015-10-24 MED ORDER — SENNA 8.6 MG PO TABS: 1.0000 | ORAL_TABLET | Freq: Every day | ORAL | Status: DC | PRN

## 2015-10-24 NOTE — Discharge Instructions (Signed)
INSTRUCTIONS AFTER Surgery  o Remove items at home which could result in a fall. This includes throw rugs or furniture in walking pathways o ICE to the affected joint every three hours while awake for 30 minutes at a time, for at least the first 3-5 days, and then as needed for pain and swelling.  Continue to use ice for pain and swelling. You may notice swelling that will progress down to the foot and ankle.  This is normal after surgery.  Elevate your leg when you are not up walking on it.   o Continue to use the breathing machine you got in the hospital (incentive spirometer) which will help keep your temperature down.  It is common for your temperature to cycle up and down following surgery, especially at night when you are not up moving around and exerting yourself.  The breathing machine keeps your lungs expanded and your temperature down.   DIET:  As you were doing prior to hospitalization, we recommend a well-balanced diet.  DRESSING / WOUND CARE / SHOWERING  The honeycomb dressings can be replaced with a Covaderm or new honeycomb dressings. Keep the wound clean and dry.  Please remove staples and apply steri strips on 11/04/15. Follow up with [facility] in 6 weeks ACTIVITY  o Increase activity slowly as tolerated, but follow the weight bearing instructions below.   o No driving for 6 weeks or until further direction given by your physician.  You cannot drive while taking narcotics.  o No lifting or carrying greater than 10 lbs. until further directed by your surgeon. o Avoid periods of inactivity such as sitting longer than an hour when not asleep. This helps prevent blood clots.  o You may return to work once you are authorized by your doctor.     WEIGHT BEARING  Weight-bearing as tolerated   EXERCISES Ambulation and gait training with a walker. Gentle range of motion and strengthening.  CONSTIPATION  Constipation is defined medically as fewer than three stools per week and  severe constipation as less than one stool per week.  Even if you have a regular bowel pattern at home, your normal regimen is likely to be disrupted due to multiple reasons following surgery.  Combination of anesthesia, postoperative narcotics, change in appetite and fluid intake all can affect your bowels.   YOU MUST use at least one of the following options; they are listed in order of increasing strength to get the job done.  They are all available over the counter, and you may need to use some, POSSIBLY even all of these options:    Drink plenty of fluids (prune juice may be helpful) and high fiber foods Colace 100 mg by mouth twice a day  Senokot for constipation as directed and as needed Dulcolax (bisacodyl), take with full glass of water  Miralax (polyethylene glycol) once or twice a day as needed.  If you have tried all these things and are unable to have a bowel movement in the first 3-4 days after surgery call either your surgeon or your primary doctor.    If you experience loose stools or diarrhea, hold the medications until you stool forms back up.  If your symptoms do not get better within 1 week or if they get worse, check with your doctor.  If you experience "the worst abdominal pain ever" or develop nausea or vomiting, please contact the office immediately for further recommendations for treatment.   ITCHING:  If you experience itching  with your medications, try taking only a single pain pill, or even half a pain pill at a time.  You can also use Benadryl over the counter for itching or also to help with sleep.   TED HOSE STOCKINGS:  Use stockings on both legs until for at least 2 weeks or as directed by physician office. They may be removed at night for sleeping.  MEDICATIONS:  See your medication summary on the After Visit Summary that nursing will review with you.  You may have some home medications which will be placed on hold until you complete the course of blood thinner  medication.  It is important for you to complete the blood thinner medication as prescribed.  PRECAUTIONS:  If you experience chest pain or shortness of breath - call 911 immediately for transfer to the hospital emergency department.   If you develop a fever greater that 101 F, purulent drainage from wound, increased redness or drainage from wound, foul odor from the wound/dressing, or calf pain - CONTACT YOUR SURGEON.                                                   FOLLOW-UP APPOINTMENTS:  If you do not already have a post-op appointment, please call the office for an appointment to be seen by your surgeon.  Guidelines for how soon to be seen are listed in your After Visit Summary, but are typically between 1-4 weeks after surgery.  OTHER INSTRUCTIONS:     MAKE SURE YOU:   Understand these instructions.   Get help right away if you are not doing well or get worse.    Thank you for letting us be a part of your medical care team.  It is a privilege we respect greatly.  We hope these instructions will help you stay on track for a fast and full recovery!

## 2015-10-24 NOTE — Progress Notes (Signed)
Patient is medically stable for D/C to [facility] today. Per [facility] admissions coordinator at [facility] patient will go to room 605. RN will call report to [facility] and arrange EMS for transport. Clinical Education officer, museum (CSW) sent D/C Summary, FL2, and D/C Packet to [employer] via [employer]. [facility] authorization has been received. Auth # [id number]. Patient is aware of above. CSW contacted patient's daughter [name] and made her aware of above. [facility] representative came to [facility] and completed admissions paper work with patient today. Please reconsult if future social work needs arise. CSW signing off.   Blima Rich, LCSW [phone]

## 2015-10-24 NOTE — Discharge Summary (Signed)
Carrie Bishop NAME: Carrie Bishop    MR#:  [id number]  DATE OF BIRTH:  07-Oct-1928  DATE OF ADMISSION:  10/20/2015 ADMITTING PHYSICIAN: Theodoro Grist, MD  DATE OF DISCHARGE: 10/24/2015  PRIMARY CARE PHYSICIAN: Cyndi Bender, PA-C    ADMISSION DIAGNOSIS:  Fracture, intertrochanteric, left femur, closed, initial encounter ([place]) [S72.142A]  DISCHARGE DIAGNOSIS:  Active Problems:   Intertrochanteric fracture of left hip (HCC)   Hyponatremia   Sinus tachycardia (HCC)   Chronic renal insufficiency   Anemia   Closed left hip fracture ([place])   SECONDARY DIAGNOSIS:   Past Medical History  Diagnosis Date  . Diabetes mellitus without complication ([place])   . Restless leg syndrome   . Neuropathy ([place])   . Depression   . Osteoporosis   . Tremor   . Hypertension   . Stroke [facility])     HOSPITAL COURSE:   Carrie Bishop is a 82 y.o. female with a known history of diabetes, neuropathy, hypertension, stroke,urinary incontinence, RLS came to the ER after her leg gave out and had a mechanical fall at home and noted to have a left hip fracture  #1. Intertrochanteric fracture of left hip- This was due to a mechanical fall.  patient is status post open reduction internal fixation of the left hip. Postop day #3 today. - pt. Pain is well controlled on oral norco and she is tolerating PT well.  - she is being discharged to SNF for further rehab and follow up with Ortho as outpatient.   #2. Hyponatremia- improved w/ IV fluids and resolved now.   #3. Acute renal failure- Resolved w/ IV fluids.   #4 altered mental status/delirium-this was due to pain medications/IV morphine, IV Valium. - it improved w/ PRN haldol. pt. Has been off narcotics, benzodiazepines. - mental status back to baseline now.    #5 essential hypertension- she will continue propranolol, lisinopril. - BP stable.   #6 depression- she will continue Cymbalta.  #7  Essential tremors - she will continue propranolol  Pt. Is being discharged to SNF for further rehab.   DISCHARGE CONDITIONS:   Stable.   CONSULTS OBTAINED:  Treatment Team:  Hessie Knows, MD  DRUG ALLERGIES:  No Known Allergies  DISCHARGE MEDICATIONS:   Current Discharge Medication List    START taking these medications   Details  docusate sodium (COLACE) 100 MG capsule Take 1 capsule (100 mg total) by mouth 2 (two) times daily. Qty: 10 capsule, Refills: 0    enoxaparin (LOVENOX) 40 MG/0.4ML injection Inject 0.4 mLs (40 mg total) into the skin daily. Qty: 14 Syringe, Refills: 0    HYDROcodone-acetaminophen (NORCO/VICODIN) 5-325 MG tablet Take 1 tablet by mouth every 4 (four) hours as needed for moderate pain. Qty: 30 tablet, Refills: 0    senna (SENOKOT) 8.6 MG TABS tablet Take 1 tablet (8.6 mg total) by mouth daily as needed for mild constipation. Qty: 120 each, Refills: 0      CONTINUE these medications which have NOT CHANGED   Details  alendronate (FOSAMAX) 70 MG tablet Take 70 mg by mouth once a week. Take with a full glass of water on an empty stomach. On Fridays.    cholecalciferol (VITAMIN D) 1000 units tablet Take 1,000 Units by mouth daily.    DULoxetine (CYMBALTA) 60 MG capsule Take 60 mg by mouth daily.     gabapentin (NEURONTIN) 600 MG tablet Take 600 mg by mouth 3 (three) times daily.  lisinopril (PRINIVIL,ZESTRIL) 40 MG tablet Take 1 tablet by mouth at bedtime.     metFORMIN (GLUCOPHAGE) 500 MG tablet Take 1,500 mg by mouth at bedtime.    pravastatin (PRAVACHOL) 40 MG tablet Take 40 mg by mouth at bedtime.    propranolol (INDERAL) 80 MG tablet Take 80 mg by mouth at bedtime.    traZODone (DESYREL) 100 MG tablet Take 100-200 mg by mouth at bedtime as needed for sleep.         DISCHARGE INSTRUCTIONS:   DIET:  Cardiac diet and Diabetic diet  DISCHARGE CONDITION:  Stable  ACTIVITY:  Activity as tolerated  OXYGEN:  Home Oxygen: No.    Oxygen Delivery: room air  DISCHARGE LOCATION:  nursing home   If you experience worsening of your admission symptoms, develop shortness of breath, life threatening emergency, suicidal or homicidal thoughts you must seek medical attention immediately by calling 911 or calling your MD immediately  if symptoms less severe.  You Must read complete instructions/literature along with all the possible adverse reactions/side effects for all the Medicines you take and that have been prescribed to you. Take any new Medicines after you have completely understood and accpet all the possible adverse reactions/side effects.   Please note  You were cared for by a hospitalist during your hospital stay. If you have any questions about your discharge medications or the care you received while you were in the hospital after you are discharged, you can call the unit and asked to speak with the hospitalist on call if the hospitalist that took care of you is not available. Once you are discharged, your primary care physician will handle any further medical issues. Please note that NO REFILLS for any discharge medications will be authorized once you are discharged, as it is imperative that you return to your primary care physician (or establish a relationship with a primary care physician if you do not have one) for your aftercare needs so that they can reassess your need for medications and monitor your lab values.     Today   Pt. Sitting up in bed eating breakfast.  Still has some left hip pain.  Mental status stable. D/c to SNF today.   VITAL SIGNS:  Blood pressure 160/59, pulse 63, temperature 98.1 F (36.7 C), temperature source Oral, resp. rate 16, height 5\' 6"  (1.676 m), weight 58.06 kg (128 lb), SpO2 94 %.  I/O:    Intake/Output Summary (Last 24 hours) at 10/24/15 0851 Last data filed at 10/23/15 1700  Gross per 24 hour  Intake    480 ml  Output      0 ml  Net    480 ml    PHYSICAL  EXAMINATION:   GENERAL: 82 y.o.-year-old patient lying in the bed in no acute distress.  EYES: Pupils equal, round, reactive to light and accommodation. No scleral icterus. Extraocular muscles intact.  HEENT: Head atraumatic, normocephalic. Oropharynx and nasopharynx clear.  NECK: Supple, no jugular venous distention. No thyroid enlargement, no tenderness.  LUNGS: Normal breath sounds bilaterally, no wheezing, rales, rhonchi. No use of accessory muscles of respiration.  CARDIOVASCULAR: S1, S2 normal. No murmurs, rubs, or gallops.  ABDOMEN: Soft, nontender, nondistended. Bowel sounds present. No organomegaly or mass.  EXTREMITIES: No cyanosis, clubbing or edema b/l. Left Hip dressing in place with no drainage or bleeding.  NEUROLOGIC: Cranial nerves II through XII are intact. No focal Motor or sensory deficits b/l.  PSYCHIATRIC: patient is alert and oriented x 3. Good  affect.  SKIN: No obvious rash, lesion, or ulcer.    DATA REVIEW:   CBC  Recent Labs Lab 10/22/15 0415 10/23/15 0326  WBC 6.8  --   HGB 8.7* 8.7*  HCT 25.4*  --   PLT 143*  --     Chemistries   Recent Labs Lab 10/20/15 1726  10/22/15 0415  NA 134*  < > 135  K 5.0  < > 4.4  CL 98*  < > 107  CO2 26  < > 25  GLUCOSE 265*  < > 208*  BUN 24*  < > 23*  CREATININE 1.37*  < > 0.83  CALCIUM 9.6  < > 8.1*  AST 23  --   --   ALT 14  --   --   ALKPHOS 71  --   --   BILITOT 1.0  --   --   < > = values in this interval not displayed.  Cardiac Enzymes No results for input(s): TROPONINI in the last 168 hours.  Microbiology Results  Results for orders placed or performed during the hospital encounter of 10/20/15  MRSA PCR Screening     Status: None   Collection Time: 10/21/15  7:56 AM  Result Value Ref Range Status   MRSA by PCR NEGATIVE NEGATIVE Final    Comment:        The GeneXpert MRSA Assay (FDA approved for NASAL specimens only), is one component of a comprehensive MRSA  colonization surveillance program. It is not intended to diagnose MRSA infection nor to guide or monitor treatment for MRSA infections.     RADIOLOGY:  No results found.    Management plans discussed with the patient, family and they are in agreement.  CODE STATUS:     Code Status Orders        Start     Ordered   10/20/15 2021  Full code   Continuous     10/20/15 2020    Code Status History    Date Active Date Inactive Code Status Order ID Comments User Context   This patient has a current code status but no historical code status.      TOTAL TIME TAKING CARE OF THIS PATIENT: 40 minutes.    Henreitta Leber M.D on 10/24/2015 at 8:51 AM  Between 7am to 6pm - Pager - [phone]  After 6pm go to [email or web address] - password [email or web address] [facility]  Office  [phone]  CC: Primary care physician; Cyndi Bender, Hershal Coria

## 2015-10-24 NOTE — Progress Notes (Signed)
Physical Therapy Treatment Patient Details Name: Carrie Bishop MRN: [id number] DOB: 26-Dec-1928 Today's Date: 10/24/2015    History of Present Illness Pt is a 82 y.o female who had a fall and sustained left hip Fx , needing an ORIF    PT Comments    Pt continues to c/o pain in L LE with any movement. She is limited by her pain and anxiety. For bed mobility she required modA +2 as she had difficulty moving L LE and getting trunk upright. STS with FWW required maxA +2 and she was unable to fully stand due to decreased knee extension and posterior lean. Squat pivot transfer performed with maxA+2 without AD. Frequent cues provided for set up and technique to increase pt's ability. She will benefit from continued skilled PT to increase functional mobility and independence.  Follow Up Recommendations  SNF     Equipment Recommendations       Recommendations for Other Services       Precautions / Restrictions Precautions Precautions: Fall Restrictions Weight Bearing Restrictions: Yes LLE Weight Bearing: Weight bearing as tolerated    Mobility  Bed Mobility Overal bed mobility: Needs Assistance;+2 for physical assistance Bed Mobility: Supine to Sit     Supine to sit: Mod assist;+2 for physical assistance     General bed mobility comments: Pt fearful and pain limited with just sitting at EOB.  With assist and cuing she was able to maintain sitting balance with B UE support and min guard, but is clearly uncomfortable the entire time.   Transfers Overall transfer level: Needs assistance Equipment used: Rolling walker (2 wheeled);None Transfers: Sit to/from [employer] Sit to Stand: Max assist;+2 physical assistance   Squat pivot transfers: Max assist;+2 physical assistance     General transfer comment: Requires cues for hand placement and anterior weight shifting. Strong posterior lean limits her standing ability. Pt not able to stand fully upright, and stand  pivot transfer not attempted. She was able to perform a squat pivot tranfers to/from bed/recliner with +2 max A.  Ambulation/Gait             General Gait Details: pt unsteady in standing and ambulation not attempted at this time   Stairs            Wheelchair Mobility    Modified Rankin (Stroke Patients Only)       Balance Overall balance assessment: Needs assistance;History of Falls Sitting-balance support: Bilateral upper extremity supported;Feet supported Sitting balance-Leahy Scale: Poor Sitting balance - Comments: poor posture Postural control: Posterior lean Standing balance support: Bilateral upper extremity supported Standing balance-Leahy Scale: Zero Standing balance comment: unable to maintain independently, requires maxA +2 to maintain                    Cognition Arousal/Alertness: Awake/alert Behavior During Therapy: Anxious Overall Cognitive Status: History of cognitive impairments - at baseline                      Exercises Other Exercises Other Exercises: B LE supine therex: ankle pumps, QS, heel slides, and hip abd slides x10 each with AAROM for L LE. Cues for proper technique.    General Comments        Pertinent Vitals/Pain Pain Assessment: 0-10 Pain Score: 8  Pain Location: L LE Pain Descriptors / Indicators: Aching Pain Intervention(s): Limited activity within patient's tolerance;Monitored during session    Home Living  Prior Function            PT Goals (current goals can now be found in the care plan section) Acute Rehab PT Goals Patient Stated Goal: to go home , to decrease pain  PT Goal Formulation: With patient Time For Goal Achievement: 11/04/15 Potential to Achieve Goals: Fair Progress towards PT goals: Progressing toward goals    Frequency  BID    PT Plan Current plan remains appropriate    Co-evaluation             End of Session Equipment Utilized During  Treatment: Gait belt Activity Tolerance: Patient limited by pain Patient left: in chair;with call bell/phone within reach;with chair alarm set;with SCD's reapplied     Time: [id number] PT Time Calculation (min) (ACUTE ONLY): 25 min  Charges:  $Therapeutic Exercise: 8-22 mins $Therapeutic Activity: 8-22 mins                    G Codes:      Neoma Laming, PT, DPT  10/24/2015, 10:11 AM [phone]

## 2015-10-24 NOTE — Care Management Important Message (Signed)
Important Message  Patient Details  Name: Carrie Bishop MRN: [id number] Date of Birth: 01/17/1929   Medicare Important Message Given:  Yes    Marshell Garfinkel, RN 10/24/2015, 1:59 PM

## 2015-10-24 NOTE — Progress Notes (Signed)
   Subjective: 3 Days Post-Op Procedure(s) (LRB): INTRAMEDULLARY (IM) NAIL INTERTROCHANTRIC LEFT LONG AFFIXUS (Left) Patient reports pain as mild.   Patient is well, and has had no acute complaints or problems Denies any CP, SOB, ABD pain. We will continue therapy today.  Plan is to go Skilled nursing facility after hospital stay.  Objective: Vital signs in last 24 hours: Temp:  [97.8 F (36.6 C)-98.1 F (36.7 C)] 98.1 F (36.7 C) (03/27 0745) Pulse Rate:  [63-73] 63 (03/27 0745) Resp:  [16-18] 16 (03/27 0745) BP: (149-168)/(58-68) 160/59 mmHg (03/27 0745) SpO2:  [91 %-99 %] 94 % (03/27 0745)  Intake/Output from previous day: 03/26 0701 - 03/27 0700 In: 480 [P.O.:480] Out: -  Intake/Output this shift:     Recent Labs  10/22/15 0415 10/23/15 0326  HGB 8.7* 8.7*    Recent Labs  10/22/15 0415  WBC 6.8  RBC 2.89*  HCT 25.4*  PLT 143*    Recent Labs  10/22/15 0415  NA 135  K 4.4  CL 107  CO2 25  BUN 23*  CREATININE 0.83  GLUCOSE 208*  CALCIUM 8.1*   No results for input(s): LABPT, INR in the last 72 hours.  EXAM General - Patient is Alert, Appropriate and Oriented Extremity - Neurovascular intact Sensation intact distally Intact pulses distally Dorsiflexion/Plantar flexion intact Dressing - dressing C/D/I and no drainage Motor Function - intact, moving foot and toes well on exam.   Past Medical History  Diagnosis Date  . Diabetes mellitus without complication ([place])   . Restless leg syndrome   . Neuropathy ([place])   . Depression   . Osteoporosis   . Tremor   . Hypertension   . Stroke ([place])     Assessment/Plan:   3 Days Post-Op Procedure(s) (LRB): INTRAMEDULLARY (IM) NAIL INTERTROCHANTRIC LEFT LONG AFFIXUS (Left) Active Problems:   Intertrochanteric fracture of left hip (HCC)   Hyponatremia   Sinus tachycardia (HCC)   Chronic renal insufficiency   Anemia   Closed left hip fracture (HCC)  Estimated body mass index is 20.67 kg/(m^2) as  calculated from the following:   Height as of this encounter: 5\' 6"  (1.676 m).   Weight as of this encounter: 58.06 kg (128 lb). Advance diet Up with therapy Discharge to SNF  Follow up with [facility] ortho in 6 weeks Remove staples and apply steri strips 11/04/15  DVT Prophylaxis - Lovenox Weight-Bearing as tolerated to left leg D/C O2 and Pulse OX and try on Room Air  T. Rachelle Hora, PA-C [place] 10/24/2015, 8:32 AM

## 2015-10-24 NOTE — NC FL2 (Signed)
[place] LEVEL OF CARE SCREENING TOOL     IDENTIFICATION  Patient Name: Carrie Bishop Birthdate: [birth year] Sex: female Admission Date (Current Location): 10/20/2015  [place] and [place] Number:  Engineering geologist and Address:  [facility], [address], [place], [place] [address]      Provider Number: [id number]  Attending Physician Name and Address:  Henreitta Leber, MD  Relative Name and Phone Number:       Current Level of Care: Hospital Recommended Level of Care: [place] Prior Approval Number:    Date Approved/Denied:   PASRR Number:  ( [id number] )  Discharge Plan: SNF    Current Diagnoses: Patient Active Problem List   Diagnosis Date Noted  . Intertrochanteric fracture of left hip ([place]) 10/20/2015  . Hyponatremia 10/20/2015  . Sinus tachycardia ([place]) 10/20/2015  . Chronic renal insufficiency 10/20/2015  . Anemia 10/20/2015  . Closed left hip fracture ([place]) 10/20/2015    Orientation RESPIRATION BLADDER Height & Weight     Self, Time, Situation, Place  Normal Continent Weight: 128 lb (58.06 kg) Height:  5\' 6"  (167.6 cm)  BEHAVIORAL SYMPTOMS/MOOD NEUROLOGICAL BOWEL NUTRITION STATUS   (none )  (none ) Continent Diet (Regular Diet )  AMBULATORY STATUS COMMUNICATION OF NEEDS Skin   Extensive Assist Verbally Surgical wounds (Incision: Left Hip. )                       Personal Care Assistance Level of Assistance  Bathing, Feeding, Dressing Bathing Assistance: Limited assistance Feeding assistance: Independent Dressing Assistance: Limited assistance     Functional Limitations Info  Sight, Hearing, Speech Sight Info: Adequate Hearing Info: Adequate Speech Info: Adequate    SPECIAL CARE FACTORS FREQUENCY  PT (By licensed PT), OT (By licensed OT)     PT Frequency:  (5) OT Frequency:  (5)            Contractures      Additional Factors Info  Code Status, Allergies Code  Status Info:  (Full Code. ) Allergies Info:  (No Known Allergies. )           Current Medications (10/24/2015):  This is the current hospital active medication list Current Facility-Administered Medications  Medication Dose Route Frequency Provider Last Rate Last Dose  . 0.9 %  sodium chloride infusion   Intravenous Continuous Hinda Kehr, MD 10 mL/hr at 10/20/15 1738    . 0.9 %  sodium chloride infusion   Intravenous Continuous Theodoro Grist, MD      . 0.9 %  sodium chloride infusion   Intravenous Continuous Theodoro Grist, MD 100 mL/hr at 10/22/15 0630    . 0.9 %  sodium chloride infusion   Intravenous Continuous Hessie Knows, MD 75 mL/hr at 10/22/15 1447    . acetaminophen (TYLENOL) tablet 650 mg  650 mg Oral Q6H PRN Theodoro Grist, MD   650 mg at 10/23/15 1647   Or  . acetaminophen (TYLENOL) suppository 650 mg  650 mg Rectal Q6H PRN Theodoro Grist, MD      . alum & mag hydroxide-simeth (MAALOX/MYLANTA) 200-200-20 MG/5ML suspension 30 mL  30 mL Oral Q4H PRN Hessie Knows, MD      . bisacodyl (DULCOLAX) suppository 10 mg  10 mg Rectal Daily PRN Hessie Knows, MD   10 mg at 10/23/15 0807  . cholecalciferol (VITAMIN D) tablet 1,000 Units  1,000 Units Oral Daily Theodoro Grist, MD   1,000 Units at  10/24/15 0835  . docusate sodium (COLACE) capsule 100 mg  100 mg Oral BID Theodoro Grist, MD   100 mg at 10/24/15 0835  . DULoxetine (CYMBALTA) DR capsule 60 mg  60 mg Oral Daily Theodoro Grist, MD   60 mg at 10/24/15 0835  . enoxaparin (LOVENOX) injection 40 mg  40 mg Subcutaneous Q24H Hessie Knows, MD   40 mg at 10/24/15 0835  . gabapentin (NEURONTIN) tablet 600 mg  600 mg Oral TID Theodoro Grist, MD   600 mg at 10/24/15 0835  . HYDROcodone-acetaminophen (NORCO/VICODIN) 5-325 MG per tablet 1 tablet  1 tablet Oral Q4H PRN Henreitta Leber, MD   1 tablet at 10/23/15 0330  . insulin aspart (novoLOG) injection 0-9 Units  0-9 Units Subcutaneous TID WC Hessie Knows, MD   3 Units at 10/24/15 [phone]  . lisinopril  (PRINIVIL,ZESTRIL) tablet 40 mg  40 mg Oral QHS Fritzi Mandes, MD   40 mg at 10/23/15 2107  . magnesium citrate solution 1 Bottle  1 Bottle Oral Once PRN Hessie Knows, MD      . magnesium hydroxide (MILK OF MAGNESIA) suspension 30 mL  30 mL Oral Daily PRN Hessie Knows, MD   30 mL at 10/22/15 0856  . menthol-cetylpyridinium (CEPACOL) lozenge 3 mg  1 lozenge Oral PRN Hessie Knows, MD       Or  . phenol [facility]) mouth spray 1 spray  1 spray Mouth/Throat PRN Hessie Knows, MD      . methocarbamol (ROBAXIN) tablet 500 mg  500 mg Oral Q6H PRN Hessie Knows, MD   500 mg at 10/22/15 1846  . metoCLOPramide (REGLAN) tablet 5-10 mg  5-10 mg Oral Q8H PRN Hessie Knows, MD       Or  . metoCLOPramide (REGLAN) injection 5-10 mg  5-10 mg Intravenous Q8H PRN Hessie Knows, MD      . ondansetron [facility]) tablet 4 mg  4 mg Oral Q6H PRN Hessie Knows, MD       Or  . ondansetron [facility]) injection 4 mg  4 mg Intravenous Q6H PRN Hessie Knows, MD      . pravastatin (PRAVACHOL) tablet 40 mg  40 mg Oral QHS Theodoro Grist, MD   40 mg at 10/23/15 2108  . propranolol (INDERAL) tablet 80 mg  80 mg Oral QHS Theodoro Grist, MD   80 mg at 10/23/15 2107  . senna (SENOKOT) tablet 8.6 mg  1 tablet Oral BID Theodoro Grist, MD   8.6 mg at 10/24/15 0835  . sodium chloride flush (NS) 0.9 % injection 3 mL  3 mL Intravenous Q12H Theodoro Grist, MD   3 mL at 10/24/15 0836  . traZODone (DESYREL) tablet 100-200 mg  100-200 mg Oral QHS PRN Theodoro Grist, MD   200 mg at 10/23/15 2107     Discharge Medications: Please see discharge summary for a list of discharge medications.  Relevant Imaging Results:  Relevant Lab Results:   Additional Information  (SSN: [id number])  Loralyn Freshwater, LCSW

## 2015-10-24 NOTE — Clinical Social Work Placement (Signed)
   CLINICAL SOCIAL WORK PLACEMENT  NOTE  Date:  10/24/2015  Patient Details  Name: Carrie Bishop MRN: [id number] Date of Birth: Jun 13, 1929  Clinical Social Work is seeking post-discharge placement for this patient at the [place] level of care (*CSW will initial, date and re-position this form in  chart as items are completed):  Yes   Patient/family provided with [facility] Work Department's list of facilities offering this level of care within the geographic area requested by the patient (or if unable, by the patient's family).  Yes   Patient/family informed of their freedom to choose among providers that offer the needed level of care, that participate in Medicare, Medicaid or managed care program needed by the patient, have an available bed and are willing to accept the patient.  Yes   Patient/family informed of [facility]'s ownership interest in [facility] and [facility], as well as of the fact that they are under no obligation to receive care at these facilities.  PASRR submitted to EDS on       PASRR number received on 10/21/15     Existing PASRR number confirmed on 10/21/15     FL2 transmitted to all facilities in geographic area requested by pt/family on 10/21/15     FL2 transmitted to all facilities within larger geographic area on       Patient informed that his/her managed care company has contracts with or will negotiate with certain facilities, including the following:        Yes   Patient/family informed of bed offers received.  Patient chooses bed at  [facility])     Physician recommends and patient chooses bed at      Patient to be transferred to  [facility]) on 10/24/15.  Patient to be transferred to facility by  (EMS)     Patient family notified on 10/24/15 of transfer.  Name of family member notified:   (Patient's daughter Consuello Masse is aware of D/C today. )     PHYSICIAN       Additional  Comment:    _______________________________________________ Loralyn Freshwater, LCSW 10/24/2015, 11:29 AM

## 2015-10-27 ENCOUNTER — Non-Acute Institutional Stay (SKILLED_NURSING_FACILITY): Payer: Commercial Managed Care - HMO | Admitting: Internal Medicine

## 2015-10-27 ENCOUNTER — Encounter: Payer: Self-pay | Admitting: Internal Medicine

## 2015-10-27 DIAGNOSIS — G250 Essential tremor: Secondary | ICD-10-CM

## 2015-10-27 DIAGNOSIS — N189 Chronic kidney disease, unspecified: Secondary | ICD-10-CM

## 2015-10-27 DIAGNOSIS — I10 Essential (primary) hypertension: Secondary | ICD-10-CM

## 2015-10-27 DIAGNOSIS — D62 Acute posthemorrhagic anemia: Secondary | ICD-10-CM | POA: Diagnosis not present

## 2015-10-27 DIAGNOSIS — M810 Age-related osteoporosis without current pathological fracture: Secondary | ICD-10-CM | POA: Diagnosis not present

## 2015-10-27 DIAGNOSIS — R2681 Unsteadiness on feet: Secondary | ICD-10-CM

## 2015-10-27 DIAGNOSIS — G4700 Insomnia, unspecified: Secondary | ICD-10-CM

## 2015-10-27 DIAGNOSIS — E1122 Type 2 diabetes mellitus with diabetic chronic kidney disease: Secondary | ICD-10-CM | POA: Diagnosis not present

## 2015-10-27 DIAGNOSIS — N289 Disorder of kidney and ureter, unspecified: Secondary | ICD-10-CM

## 2015-10-27 DIAGNOSIS — F329 Major depressive disorder, single episode, unspecified: Secondary | ICD-10-CM | POA: Diagnosis not present

## 2015-10-27 DIAGNOSIS — S72002S Fracture of unspecified part of neck of left femur, sequela: Secondary | ICD-10-CM | POA: Diagnosis not present

## 2015-10-27 DIAGNOSIS — M792 Neuralgia and neuritis, unspecified: Secondary | ICD-10-CM | POA: Diagnosis not present

## 2015-10-27 NOTE — Progress Notes (Signed)
LOCATION: [facility]  PCP: Fae Pippin   Code Status: Full Code  Goals of care: Advanced Directive information Advanced Directives 10/21/2015  Does patient have an advance directive? No  Would patient like information on creating an advanced directive? No - patient declined information       Extended Emergency Contact Information Primary Emergency Contact: [facility] Address: [address]          [place], [place] [address] Johnnette Litter of [place] Phone: [phone] Work Phone: [phone] Mobile Phone: [phone] Relation: Daughter Secondary Emergency Contact: Breck Coons, [place] of [place] Mobile Phone: [phone] Relation: Son   No Known Allergies  Chief Complaint  Patient presents with  . New Admit To SNF    New Admission     HPI:  Patient is a 82 y.o. female seen today for short term rehabilitation post hospital admission from 10/20/15-10/24/15 with intertrochanteric fracture of left hip post fall, acute kidney injury and hyponatremia. She underwent ORIF. She also received iv fluids. She has PMH of HTN, depression and essential tremors. She is seen in her room today. Her daughter and son in law are present. She complaints of pain to her left leg.   Review of Systems:  Constitutional: Negative for fever, chills, diaphoresis. Gets tired easily.  HENT: Negative for headache, congestion, nasal discharge, sore throat, difficulty swallowing.   Eyes: Negative for blurred vision, double vision and discharge. wears glasses. Respiratory: Negative for cough, shortness of breath and wheezing.   Cardiovascular: Negative for chest pain, palpitations, leg swelling.  Gastrointestinal: Negative for heartburn, vomiting, abdominal pain. Positive for nausea. Last bowel movement was a week back and she has chronic constipation. Genitourinary: Negative for dysuria and flank pain.  Musculoskeletal: Negative for back pain, fall  in the facility.  Skin: Negative for itching, rash.  Neurological: Negative for dizziness. Psychiatric/Behavioral: positive for depression.   Past Medical History  Diagnosis Date  . Diabetes mellitus without complication ([place])   . Restless leg syndrome   . Neuropathy ([place])   . Depression   . Osteoporosis   . Tremor   . Hypertension   . Stroke [facility])    Past Surgical History  Procedure Laterality Date  . Leg surgery    . Arm surgery    . Abdominal hysterectomy    . Intramedullary (im) nail intertrochanteric Left 10/21/2015    Procedure: INTRAMEDULLARY (IM) NAIL INTERTROCHANTRIC LEFT LONG AFFIXUS;  Surgeon: Hessie Knows, MD;  Location: [facility] ORS;  Service: Orthopedics;  Laterality: Left;   Social History:   reports that she has never smoked. She does not have any smokeless tobacco history on file. She reports that she does not drink alcohol or use illicit drugs.  No family history on file.  Medications:   Medication List       This list is accurate as of: 10/27/15  1:24 PM.  Always use your most recent med list.               alendronate 70 MG tablet  Commonly known as:  FOSAMAX  Take 70 mg by mouth once a week. Take with a full glass of water on an empty stomach. On Fridays.     atorvastatin 10 MG tablet  Commonly known as:  LIPITOR  Take 10 mg by mouth at bedtime.     cholecalciferol 1000 units tablet  Commonly known as:  VITAMIN D  Take 1,000 Units by mouth daily.  docusate sodium 100 MG capsule  Commonly known as:  COLACE  Take 1 capsule (100 mg total) by mouth 2 (two) times daily.     DULoxetine 60 MG capsule  Commonly known as:  CYMBALTA  Take 60 mg by mouth daily.     enoxaparin 40 MG/0.4ML injection  Commonly known as:  LOVENOX  Inject 0.4 mLs (40 mg total) into the skin daily.     gabapentin 600 MG tablet  Commonly known as:  NEURONTIN  Take 600 mg by mouth 3 (three) times daily.     HYDROcodone-acetaminophen 5-325 MG tablet  Commonly known  as:  NORCO/VICODIN  Take 1 tablet by mouth every 4 (four) hours as needed for moderate pain.     lisinopril 40 MG tablet  Commonly known as:  PRINIVIL,ZESTRIL  Take 1 tablet by mouth at bedtime.     metFORMIN 500 MG tablet  Commonly known as:  GLUCOPHAGE  Take 1,500 mg by mouth at bedtime.     primidone 50 MG tablet  Commonly known as:  MYSOLINE  Take 50 mg by mouth at bedtime.     propranolol 80 MG tablet  Commonly known as:  INDERAL  Take 80 mg by mouth at bedtime.     senna 8.6 MG Tabs tablet  Commonly known as:  SENOKOT  Take 1 tablet (8.6 mg total) by mouth daily as needed for mild constipation.     traZODone 100 MG tablet  Commonly known as:  DESYREL  Take 100-200 mg by mouth at bedtime as needed for sleep.        Immunizations: Immunization History  Administered Date(s) Administered  . PPD Test 10/24/2015     Physical Exam: Filed Vitals:   10/27/15 1313  BP: 130/66  Pulse: 68  Temp: 97.8 F (36.6 C)  TempSrc: Oral  Resp: 20  Height: 5\' 6"  (1.676 m)  Weight: 128 lb (58.06 kg)  SpO2: 97%   Body mass index is 20.67 kg/(m^2).  General- elderly female, frail, in no acute distress Head- normocephalic, atraumatic Nose- no maxillary or frontal sinus tenderness, no nasal discharge Throat- moist mucus membrane Eyes- PERRLA, EOMI, no pallor, no icterus, no discharge, normal conjunctiva, normal sclera Neck- no cervical lymphadenopathy Cardiovascular- normal s1,s2, no murmur, 1+ left and trace right leg edema Respiratory- bilateral clear to auscultation, no wheeze, no rhonchi, no crackles, no use of accessory muscles Abdomen- bowel sounds present, soft, non tender Musculoskeletal- able to move all 4 extremities, limited left hip ROM  Neurological- alert and oriented to person, place and time. Essential tremors present Skin- warm and dry, left hip surgical incision with honeycomb dressing Psychiatry- tearful this visit   Labs reviewed: Basic Metabolic  Panel:  Recent Labs  10/20/15 1726 10/21/15 0559 10/22/15 10/22/15 0415  NA 134* 134* 135* 135  K 5.0 4.5  --  4.4  CL 98* 103  --  107  CO2 26 27  --  25  GLUCOSE 265* 229*  --  208*  BUN 24* 29* 23* 23*  CREATININE 1.37* 0.92 0.8 0.83  CALCIUM 9.6 8.4*  --  8.1*   Liver Function Tests:  Recent Labs  10/20/15 1726  AST 23  ALT 14  ALKPHOS 71  BILITOT 1.0  PROT 7.6  ALBUMIN 4.0   No results for input(s): LIPASE, AMYLASE in the last 8760 hours. No results for input(s): AMMONIA in the last 8760 hours. CBC:  Recent Labs  10/20/15 1726 10/21/15 0559 10/22/15 10/22/15 0415 10/23/15 0326  WBC 10.9 6.0 6.8 6.8  --   NEUTROABS 8.3*  --   --   --   --   HGB 11.8* 9.7*  --  8.7* 8.7*  HCT 34.4* 28.4*  --  25.4*  --   MCV 87.6 87.7  --  87.9  --   PLT 269 175  --  143*  --    Cardiac Enzymes: No results for input(s): CKTOTAL, CKMB, CKMBINDEX, TROPONINI in the last 8760 hours. BNP: Invalid input(s): POCBNP CBG:  Recent Labs  10/23/15 2218 10/24/15 0746 10/24/15 1103  GLUCAP 189* 222* 313*    Radiological Exams: Dg Chest 1 View  10/20/2015  CLINICAL DATA:  Preop for hip fracture EXAM: CHEST 1 VIEW COMPARISON:  Eleven/1/14 FINDINGS: Cardiomediastinal silhouette is stable. No acute infiltrate or pleural effusion. No pulmonary edema. Atherosclerotic calcifications of thoracic aorta again noted. Mild elevation of the right hemidiaphragm again noted. IMPRESSION: No active disease. Electronically Signed   By: Lahoma Crocker M.D.   On: 10/20/2015 17:12   Dg Hip Operative Unilat W Or W/o Pelvis Left  10/21/2015  CLINICAL DATA:  Left femur fracture EXAM: OPERATIVE LEFT HIP (WITH PELVIS IF PERFORMED) 8 VIEWS TECHNIQUE: Fluoroscopic spot image(s) were submitted for interpretation post-operatively. COMPARISON:  10/20/2015 FINDINGS: Images demonstrate placement of a dynamic compression screw and intra medullary rod transfixing an intertrochanteric left femur fracture. There is 1  distal interlocking screw. There is no breakage or loosening of the hardware. Anatomic alignment of the osseous fragments. IMPRESSION: ORIF intertrochanteric left femur fracture. Electronically Signed   By: Marybelle Killings M.D.   On: 10/21/2015 12:55   Dg Hip Unilat With Pelvis 2-3 Views Left  10/20/2015  CLINICAL DATA:  Fall, left hip pain EXAM: DG HIP (WITH OR WITHOUT PELVIS) 2-3V LEFT COMPARISON:  None. FINDINGS: Three views of the left hip submitted. There is mild impacted with minimal angulation intertrochanteric fracture of proximal left femur. IMPRESSION: Mild impacted minimal angulated intertrochanteric fracture of proximal left femur. Electronically Signed   By: Lahoma Crocker M.D.   On: 10/20/2015 17:15    Assessment/Plan  Unsteady gait Will have patient work with PT/OT as tolerated to regain strength and restore function.  Fall precautions are in place.  Left femur fracture S/p ORIF. Will have her work with physical therapy and occupational therapy team to help with gait training and muscle strengthening exercises.fall precautions. Skin care. Encourage to be out of bed. Continue norco 5-325 mg but change this to 1-2 tab q4h prn pain. Continue lovenox for dvt prophylaxis for atleast 2 weeks. Has f/u with orthopedics. Add ted hose  Blood loss anemia Post op, monitor cbc  Chronic constipation Discontinue colace. Start senokot s 2 tab qhs and add miralax daily x 2 days, then daily as needed. Hydration encouraged  Essential tremors Continue primidone 50 mg qhs and propranolol 80 mg daily.   Osteoporosis Continue weekly fosamax and daily vitamin d  Chronic depression Continue cymbalta 60 mg daily, get psychiatry consult  Neuropathic pain Continue neurontin 600 mg tid  HTN Check bp, continue lisinopril 40 mg daily  DM Lab Results  Component Value Date   HGBA1C 8.7 10/23/2015   Monitor cbg, continue metformin 1500 mg qhs and statin  Insomnia Continue trazodone current  regimen  Chronic renal insufficiency Monitor bmp  Goals of care: short term rehabilitation   Labs/tests ordered: cbc, cmp 10/31/15  Family/ staff Communication: reviewed care plan with patient and nursing supervisor    Blanchie Serve, MD Internal  [facility] [place], [place] [address] Cell Phone (Monday-Friday 8 am - 5 pm): [phone] On Call: [phone] and follow prompts after 5 pm and on weekends Office Phone: [phone] Office Fax: [phone]

## 2015-10-31 LAB — HEPATIC FUNCTION PANEL
ALK PHOS: 83 U/L (ref 25–125)
ALT: 34 U/L (ref 7–35)
AST: 65 U/L — AB (ref 13–35)
BILIRUBIN, TOTAL: 1 mg/dL

## 2015-10-31 LAB — CBC AND DIFFERENTIAL
HEMATOCRIT: 29 % — AB (ref 36–46)
Hemoglobin: 9.3 g/dL — AB (ref 12.0–16.0)
PLATELETS: 293 10*3/uL (ref 150–399)
WBC: 5.7 10*3/mL

## 2015-10-31 LAB — BASIC METABOLIC PANEL
BUN: 15 mg/dL (ref 4–21)
Creatinine: 0.8 mg/dL (ref 0.5–1.1)
GLUCOSE: 224 mg/dL
Potassium: 4.5 mmol/L (ref 3.4–5.3)
Sodium: 135 mmol/L — AB (ref 137–147)

## 2015-11-01 ENCOUNTER — Non-Acute Institutional Stay (SKILLED_NURSING_FACILITY): Payer: Commercial Managed Care - HMO | Admitting: Family

## 2015-11-01 DIAGNOSIS — E1121 Type 2 diabetes mellitus with diabetic nephropathy: Secondary | ICD-10-CM

## 2015-11-01 DIAGNOSIS — E119 Type 2 diabetes mellitus without complications: Secondary | ICD-10-CM | POA: Insufficient documentation

## 2015-11-01 MED ORDER — INSULIN GLARGINE 100 UNIT/ML SOLOSTAR PEN: 5.0000 [IU] | PEN_INJECTOR | Freq: Every day | SUBCUTANEOUS | Status: DC

## 2015-11-01 NOTE — Progress Notes (Signed)
Patient ID: Carrie Bishop, female   DOB: March 01, 1929, 82 y.o.   MRN: [id number]  Location:  [place]:  SNF [phone]) Provider: Blanchie Serve, MD   Cyndi Bender, PA-C  Patient Care Team: Cyndi Bender, PA-C as PCP - General (Physician Assistant)  Extended Emergency Contact Information Primary Emergency Contact: [facility] Address: [address]          [place], [place] [address] Johnnette Litter of [place] Phone: [phone] Work Phone: [phone] Mobile Phone: [phone] Relation: Daughter Secondary Emergency Contact: Breck Coons, [place] of [place] Mobile Phone: [phone] Relation: Son  Code Status: Full Code  Goals of care: Advanced Directive information Advanced Directives 10/21/2015  Does patient have an advance directive? No  Would patient like information on creating an advanced directive? No - patient declined information     Chief Complaint  Patient presents with  . Acute Visit    HPI:  Pt is a 82 y.o. female seen today at [facility] for an acute visit for high glucose level. She has a medical history of  Type 2 DM, Neuropathy, HTN, depression, stroke among others. She is seen in her room today with her daughter at bedside. She denies any acute issues. Left hip pain under controlled with current medication. Facility Nurse reports CBG's averages in the upper 200's every morning.    Past Medical History  Diagnosis Date  . Diabetes mellitus without complication ([facility])   . Restless leg syndrome   . Neuropathy ([place])   . Depression   . Osteoporosis   . Tremor   . Hypertension   . Stroke [facility])    Past Surgical History  Procedure Laterality Date  . Leg surgery    . Arm surgery    . Abdominal hysterectomy    . Intramedullary (im) nail intertrochanteric Left 10/21/2015    Procedure: INTRAMEDULLARY (IM) NAIL INTERTROCHANTRIC LEFT LONG AFFIXUS;  Surgeon: Hessie Knows,  MD;  Location: [facility] ORS;  Service: Orthopedics;  Laterality: Left;    No Known Allergies    Medication List       This list is accurate as of: 11/01/15  2:45 PM.  Always use your most recent med list.               alendronate 70 MG tablet  Commonly known as:  FOSAMAX  Take 70 mg by mouth once a week. Take with a full glass of water on an empty stomach. On Fridays.     atorvastatin 10 MG tablet  Commonly known as:  LIPITOR  Take 10 mg by mouth at bedtime.     cholecalciferol 1000 units tablet  Commonly known as:  VITAMIN D  Take 1,000 Units by mouth daily.     DULoxetine 60 MG capsule  Commonly known as:  CYMBALTA  Take 60 mg by mouth daily.     enoxaparin 40 MG/0.4ML injection  Commonly known as:  LOVENOX  Inject 0.4 mLs (40 mg total) into the skin daily.     gabapentin 600 MG tablet  Commonly known as:  NEURONTIN  Take 600 mg by mouth 3 (three) times daily.     HYDROcodone-acetaminophen 5-325 MG tablet  Commonly known as:  NORCO/VICODIN  Take 1 tablet by mouth every 4 (four) hours as needed for moderate pain.     lisinopril 40 MG tablet  Commonly known as:  PRINIVIL,ZESTRIL  Take 1  tablet by mouth at bedtime.     metFORMIN 500 MG tablet  Commonly known as:  GLUCOPHAGE  Take 1,500 mg by mouth at bedtime.     polyethylene glycol packet  Commonly known as:  MIRALAX / GLYCOLAX  Take 17 g by mouth daily.     primidone 50 MG tablet  Commonly known as:  MYSOLINE  Take 50 mg by mouth at bedtime.     propranolol 80 MG tablet  Commonly known as:  INDERAL  Take 80 mg by mouth at bedtime.     senna 8.6 MG Tabs tablet  Commonly known as:  SENOKOT  Take 1 tablet (8.6 mg total) by mouth daily as needed for mild constipation.     traZODone 100 MG tablet  Commonly known as:  DESYREL  Take 100-200 mg by mouth at bedtime as needed for sleep.        Review of Systems  Constitutional: Negative for fever, chills, activity change, appetite change and fatigue.    HENT: Negative.   Eyes: Negative.   Respiratory: Negative.   Cardiovascular: Negative.   Gastrointestinal: Negative for vomiting, abdominal pain, diarrhea, constipation and abdominal distention.  Endocrine: Negative for polydipsia, polyphagia and polyuria.  Musculoskeletal: Positive for gait problem.       Left hip surgical incision.   Skin: Negative.   Psychiatric/Behavioral: Negative.     Immunization History  Administered Date(s) Administered  . PPD Test 10/24/2015   Pertinent  Health Maintenance Due  Topic Date Due  . FOOT EXAM  05/14/1941  . OPHTHALMOLOGY EXAM  05/14/1941  . PNA vac Low Risk Adult (1 of 2 - PCV13) 05/14/1996  . INFLUENZA VACCINE  02/28/2016  . HEMOGLOBIN A1C  04/24/2016  . DEXA SCAN  Completed   No flowsheet data found. Functional Status Survey:    Filed Vitals:   11/01/15 1416  BP: 129/76  Pulse: 84  Temp: 97.5 F (36.4 C)  Resp: 20  Height: 5\' 6"  (1.676 m)  Weight: 129 lb (58.514 kg)  SpO2: 98%   Body mass index is 20.83 kg/(m^2). Physical Exam  Constitutional: She is oriented to person, place, and time. She appears well-developed and well-nourished.  Elderly in no acute distress  HENT:  Head: Normocephalic.  Mouth/Throat: Oropharynx is clear and moist.  Eyes: Conjunctivae and EOM are normal. Pupils are equal, round, and reactive to light. Right eye exhibits no discharge. Left eye exhibits no discharge. No scleral icterus.  Neck: Normal range of motion. No JVD present. No thyromegaly present.  Cardiovascular: Normal rate, regular rhythm, normal heart sounds and intact distal pulses.  Exam reveals no gallop and no friction rub.   No murmur heard. Pulmonary/Chest: Effort normal and breath sounds normal. No respiratory distress. She has no wheezes. She has no rales.  Abdominal: Soft. Bowel sounds are normal. She exhibits no distension and no mass. There is no tenderness. There is no rebound and no guarding.  Lymphadenopathy:    She has no  cervical adenopathy.  Neurological: She is oriented to person, place, and time.  Skin: Skin is warm and dry. No rash noted. No erythema. No pallor.  Psychiatric: She has a normal mood and affect.    Labs reviewed:  Recent Labs  10/20/15 1726 10/21/15 0559 10/22/15 10/22/15 0415  NA 134* 134* 135* 135  K 5.0 4.5  --  4.4  CL 98* 103  --  107  CO2 26 27  --  25  GLUCOSE 265* 229*  --  208*  BUN 24* 29* 23* 23*  CREATININE 1.37* 0.92 0.8 0.83  CALCIUM 9.6 8.4*  --  8.1*    Recent Labs  10/20/15 1726  AST 23  ALT 14  ALKPHOS 71  BILITOT 1.0  PROT 7.6  ALBUMIN 4.0    Recent Labs  10/20/15 1726 10/21/15 0559 10/22/15 10/22/15 0415 10/23/15 0326  WBC 10.9 6.0 6.8 6.8  --   NEUTROABS 8.3*  --   --   --   --   HGB 11.8* 9.7*  --  8.7* 8.7*  HCT 34.4* 28.4*  --  25.4*  --   MCV 87.6 87.7  --  87.9  --   PLT 269 175  --  143*  --    No results found for: TSH Lab Results  Component Value Date   HGBA1C 8.7 10/23/2015   No results found for: CHOL, HDL, LDLCALC, LDLDIRECT, TRIG, CHOLHDL  Significant Diagnostic Results in last 30 days:  Dg Chest 1 View  10/20/2015  CLINICAL DATA:  Preop for hip fracture EXAM: CHEST 1 VIEW COMPARISON:  Eleven/1/14 FINDINGS: Cardiomediastinal silhouette is stable. No acute infiltrate or pleural effusion. No pulmonary edema. Atherosclerotic calcifications of thoracic aorta again noted. Mild elevation of the right hemidiaphragm again noted. IMPRESSION: No active disease. Electronically Signed   By: Lahoma Crocker M.D.   On: 10/20/2015 17:12   Dg Hip Operative Unilat W Or W/o Pelvis Left  10/21/2015  CLINICAL DATA:  Left femur fracture EXAM: OPERATIVE LEFT HIP (WITH PELVIS IF PERFORMED) 8 VIEWS TECHNIQUE: Fluoroscopic spot image(s) were submitted for interpretation post-operatively. COMPARISON:  10/20/2015 FINDINGS: Images demonstrate placement of a dynamic compression screw and intra medullary rod transfixing an intertrochanteric left femur  fracture. There is 1 distal interlocking screw. There is no breakage or loosening of the hardware. Anatomic alignment of the osseous fragments. IMPRESSION: ORIF intertrochanteric left femur fracture. Electronically Signed   By: Marybelle Killings M.D.   On: 10/21/2015 12:55   Dg Hip Unilat With Pelvis 2-3 Views Left  10/20/2015  CLINICAL DATA:  Fall, left hip pain EXAM: DG HIP (WITH OR WITHOUT PELVIS) 2-3V LEFT COMPARISON:  None. FINDINGS: Three views of the left hip submitted. There is mild impacted with minimal angulation intertrochanteric fracture of proximal left femur. IMPRESSION: Mild impacted minimal angulated intertrochanteric fracture of proximal left femur. Electronically Signed   By: Lahoma Crocker M.D.   On: 10/20/2015 17:15    Assessment/Plan Type 2 diabetes mellitus with diabetic nephropathy, without long-term current use of insulin (HCC) Recent Hgb A1C 8.7 (10/23/2015). CBG's averages in upper 200's. Will add Lantus 5 units SQ at bedtime .Check CBG's twice daily in the morning and afternoon.Continue to monitor  Hgb A1C.     Family/ staff Communication: Reviewed plan with patient's daughter, patient and facility Nurse Supervisor.   Labs/tests ordered: None

## 2015-11-03 ENCOUNTER — Other Ambulatory Visit: Payer: Self-pay

## 2015-11-03 MED ORDER — HYDROCODONE-ACETAMINOPHEN 5-325 MG PO TABS: 1.0000 | ORAL_TABLET | ORAL | Status: DC | PRN

## 2015-11-07 ENCOUNTER — Non-Acute Institutional Stay: Payer: Commercial Managed Care - HMO | Admitting: Family

## 2015-11-07 ENCOUNTER — Encounter: Payer: Self-pay | Admitting: Family

## 2015-11-07 ENCOUNTER — Other Ambulatory Visit: Payer: Self-pay

## 2015-11-07 DIAGNOSIS — M810 Age-related osteoporosis without current pathological fracture: Secondary | ICD-10-CM

## 2015-11-07 DIAGNOSIS — D649 Anemia, unspecified: Secondary | ICD-10-CM | POA: Diagnosis not present

## 2015-11-07 DIAGNOSIS — G4700 Insomnia, unspecified: Secondary | ICD-10-CM

## 2015-11-07 DIAGNOSIS — E1121 Type 2 diabetes mellitus with diabetic nephropathy: Secondary | ICD-10-CM

## 2015-11-07 DIAGNOSIS — M792 Neuralgia and neuritis, unspecified: Secondary | ICD-10-CM | POA: Diagnosis not present

## 2015-11-07 DIAGNOSIS — F329 Major depressive disorder, single episode, unspecified: Secondary | ICD-10-CM

## 2015-11-07 DIAGNOSIS — S72002D Fracture of unspecified part of neck of left femur, subsequent encounter for closed fracture with routine healing: Secondary | ICD-10-CM | POA: Diagnosis not present

## 2015-11-07 DIAGNOSIS — G250 Essential tremor: Secondary | ICD-10-CM

## 2015-11-07 DIAGNOSIS — F32A Depression, unspecified: Secondary | ICD-10-CM | POA: Insufficient documentation

## 2015-11-07 MED ORDER — HYDROCODONE-ACETAMINOPHEN 5-325 MG PO TABS: 1.0000 | ORAL_TABLET | ORAL | Status: DC | PRN

## 2015-11-07 NOTE — Progress Notes (Signed)
Location:  [place] Room Number: [place]:  SNF [phone])  Provider: Marlowe Sax, FNP-C Blanchie Serve, MD   PCP: Fae Pippin Patient Care Team: Cyndi Bender, PA-C as PCP - General (Physician Assistant)  Extended Emergency Contact Information Primary Emergency Contact: [facility] Address: [address]          [place], [place] [address] Johnnette Litter of [place] Phone: [phone] Work Phone: [phone] Mobile Phone: [phone] Relation: Daughter Secondary Emergency Contact: Breck Coons, [place] of [place] Mobile Phone: [phone] Relation: Son  Code Status: Full Code  Goals of care:  Advanced Directive information Advanced Directives 10/21/2015  Does patient have an advance directive? No  Would patient like information on creating an advanced directive? No - patient declined information     No Known Allergies  Chief Complaint  Patient presents with  . Discharge Note    Discharged from SNF    HPI:  82 y.o. female seen today at [facility] for discharge home. She was here for short term rehabilitation post hospital admission from 10/20/15-10/24/15 with intertrochanteric fracture of left hip post fall, acute kidney injury and hyponatremia. She underwent ORIF. She also received iv fluids. She has PMH of HTN, depression and essential tremors. She is seen in her room today with her Son, daughter and son in law at the bedside.She states left hip pain under controlled with current medication. She has worked with PT/OT and now stable for discharge home to continue with PT/OT for ROM, exercise, gait stability and muscle strengthening. She does not require any DME patient's daughter state has own Arts administrator and wheelchair at home.Facility Education officer, museum will arrange for home health services prior to discharge home. Facility staff will administer Insulin inject and Levonox  injection self administration to patient and patient's daughter prior to discharge.      Past Medical History  Diagnosis Date  . Diabetes mellitus without complication ([place])   . Restless leg syndrome   . Neuropathy ([place])   . Depression   . Osteoporosis   . Tremor   . Hypertension   . Stroke [facility])     Past Surgical History  Procedure Laterality Date  . Leg surgery    . Arm surgery    . Abdominal hysterectomy    . Intramedullary (im) nail intertrochanteric Left 10/21/2015    Procedure: INTRAMEDULLARY (IM) NAIL INTERTROCHANTRIC LEFT LONG AFFIXUS;  Surgeon: Hessie Knows, MD;  Location: [facility] ORS;  Service: Orthopedics;  Laterality: Left;      reports that she has never smoked. She does not have any smokeless tobacco history on file. She reports that she does not drink alcohol or use illicit drugs. Social History   Social History  . Marital Status: Widowed    Spouse Name: N/A  . Number of Children: N/A  . Years of Education: N/A   Occupational History  . Not on file.   Social History Main Topics  . Smoking status: Never Smoker   . Smokeless tobacco: Not on file  . Alcohol Use: No  . Drug Use: No  . Sexual Activity: Not on file   Other Topics Concern  . Not on file   Social History Narrative   Functional Status Survey:    No Known Allergies  Pertinent  Health Maintenance Due  Topic Date Due  . FOOT EXAM  05/14/1941  . OPHTHALMOLOGY EXAM  05/14/1941  .  PNA vac Low Risk Adult (1 of 2 - PCV13) 05/14/1996  . INFLUENZA VACCINE  02/28/2016  . HEMOGLOBIN A1C  04/24/2016  . DEXA SCAN  Completed    Medications:   Medication List       This list is accurate as of: 11/07/15  2:40 PM.  Always use your most recent med list.               alendronate 70 MG tablet  Commonly known as:  FOSAMAX  Take 70 mg by mouth once a week. Take with a full glass of water on an empty stomach. On Fridays.     atorvastatin 10 MG tablet  Commonly known as:  LIPITOR  Take 10 mg  by mouth at bedtime.     baclofen 10 MG tablet  Commonly known as:  LIORESAL  Take 10 mg by mouth every 8 (eight) hours as needed for muscle spasms.     cholecalciferol 1000 units tablet  Commonly known as:  VITAMIN D  Take 1,000 Units by mouth daily.     DULoxetine 60 MG capsule  Commonly known as:  CYMBALTA  Take 60 mg by mouth daily.     enoxaparin 40 MG/0.4ML injection  Commonly known as:  LOVENOX  Inject 0.4 mLs (40 mg total) into the skin daily.     gabapentin 600 MG tablet  Commonly known as:  NEURONTIN  Take 600 mg by mouth 3 (three) times daily.     HYDROcodone-acetaminophen 5-325 MG tablet  Commonly known as:  NORCO/VICODIN  Take 1-2 tablets by mouth every 4 (four) hours as needed for moderate pain.     Insulin Glargine 100 UNIT/ML Solostar Pen  Commonly known as:  LANTUS SOLOSTAR  Inject 5 Units into the skin daily at 10 pm.     lisinopril 40 MG tablet  Commonly known as:  PRINIVIL,ZESTRIL  Take 1 tablet by mouth at bedtime.     metFORMIN 500 MG tablet  Commonly known as:  GLUCOPHAGE  Take 1,500 mg by mouth at bedtime.     polyethylene glycol packet  Commonly known as:  MIRALAX / GLYCOLAX  Take 17 g by mouth daily as needed.     primidone 50 MG tablet  Commonly known as:  MYSOLINE  Take 50 mg by mouth at bedtime.     propranolol 80 MG tablet  Commonly known as:  INDERAL  Take 80 mg by mouth at bedtime.     senna 8.6 MG Tabs tablet  Commonly known as:  SENOKOT  Take 2 tablets by mouth at bedtime.     traZODone 100 MG tablet  Commonly known as:  DESYREL  Take 100-200 mg by mouth at bedtime as needed for sleep.        Review of Systems  Constitutional: Negative for fever, chills, activity change, appetite change and fatigue.  HENT: Negative for rhinorrhea, sinus pressure, sneezing and sore throat.   Eyes: Negative.   Respiratory: Negative for cough, chest tightness, shortness of breath and wheezing.   Cardiovascular: Negative for chest pain,  palpitations and leg swelling.  Gastrointestinal: Negative for nausea, vomiting, abdominal pain, diarrhea, constipation and abdominal distention.  Endocrine: Negative.   Genitourinary: Negative for urgency, hematuria and flank pain.  Musculoskeletal: Positive for gait problem. Negative for back pain.  Skin: Negative.        Left hip surgical incision.   Neurological: Negative.   Psychiatric/Behavioral: Negative for hallucinations, sleep disturbance and agitation. The patient is not nervous/anxious.  Patient's daughter reports some confusion at times.     Filed Vitals:   11/07/15 1405  BP: 135/67  Pulse: 79  Temp: 97.5 F (36.4 C)  Resp: 20  Height: 5\' 6"  (1.676 m)  Weight: 129 lb (58.514 kg)  SpO2: 96%   Body mass index is 20.83 kg/(m^2). Physical Exam  Constitutional: She appears well-developed.  Elderly in no acute distress   HENT:  Head: Normocephalic.  Mouth/Throat: Oropharynx is clear and moist.  Eyes: Conjunctivae and EOM are normal. Pupils are equal, round, and reactive to light. Right eye exhibits no discharge. Left eye exhibits no discharge. No scleral icterus.  Neck: Normal range of motion. No JVD present.  Cardiovascular: Normal rate, regular rhythm and intact distal pulses.  Exam reveals no gallop and no friction rub.   No murmur heard. Pulmonary/Chest: Effort normal and breath sounds normal. No respiratory distress. She has no wheezes. She has no rales.  Abdominal: Soft. Bowel sounds are normal. She exhibits no distension and no mass. There is no tenderness. There is no rebound and no guarding.  Musculoskeletal: She exhibits no edema or tenderness.  Normal ROM except left hip limited due to pain.   Lymphadenopathy:    She has no cervical adenopathy.  Neurological: She is alert.  Skin: Skin is warm and dry. No rash noted. No erythema. No pallor.  Left lateral leg surgical incision honeycomb drsg dry, clean and intact. Surrounding skin without any redness,  swelling, drainage or tenderness.   Psychiatric: She has a normal mood and affect.    Labs reviewed: Basic Metabolic Panel:  Recent Labs  10/20/15 1726 10/21/15 0559 10/22/15 10/22/15 0415 10/31/15  NA 134* 134* 135* 135 135*  K 5.0 4.5  --  4.4 4.5  CL 98* 103  --  107  --   CO2 26 27  --  25  --   GLUCOSE 265* 229*  --  208*  --   BUN 24* 29* 23* 23* 15  CREATININE 1.37* 0.92 0.8 0.83 0.8  CALCIUM 9.6 8.4*  --  8.1*  --    Liver Function Tests:  Recent Labs  10/20/15 1726 10/31/15  AST 23 65*  ALT 14 34  ALKPHOS 71 83  BILITOT 1.0  --   PROT 7.6  --   ALBUMIN 4.0  --    No results for input(s): LIPASE, AMYLASE in the last 8760 hours. No results for input(s): AMMONIA in the last 8760 hours. CBC:  Recent Labs  10/20/15 1726 10/21/15 0559 10/22/15 10/22/15 0415 10/23/15 0326 10/31/15  WBC 10.9 6.0 6.8 6.8  --  5.7  NEUTROABS 8.3*  --   --   --   --   --   HGB 11.8* 9.7*  --  8.7* 8.7* 9.3*  HCT 34.4* 28.4*  --  25.4*  --  29*  MCV 87.6 87.7  --  87.9  --   --   PLT 269 175  --  143*  --  293   Cardiac Enzymes: No results for input(s): CKTOTAL, CKMB, CKMBINDEX, TROPONINI in the last 8760 hours. BNP: Invalid input(s): POCBNP CBG:  Recent Labs  10/23/15 2218 10/24/15 0746 10/24/15 1103  GLUCAP 189* 222* 313*    Procedures and Imaging Studies During Stay: Dg Chest 1 View  10/20/2015  CLINICAL DATA:  Preop for hip fracture EXAM: CHEST 1 VIEW COMPARISON:  Eleven/1/14 FINDINGS: Cardiomediastinal silhouette is stable. No acute infiltrate or pleural effusion. No pulmonary edema. Atherosclerotic calcifications of thoracic  aorta again noted. Mild elevation of the right hemidiaphragm again noted. IMPRESSION: No active disease. Electronically Signed   By: Lahoma Crocker M.D.   On: 10/20/2015 17:12   Dg Hip Operative Unilat W Or W/o Pelvis Left  10/21/2015  CLINICAL DATA:  Left femur fracture EXAM: OPERATIVE LEFT HIP (WITH PELVIS IF PERFORMED) 8 VIEWS TECHNIQUE:  Fluoroscopic spot image(s) were submitted for interpretation post-operatively. COMPARISON:  10/20/2015 FINDINGS: Images demonstrate placement of a dynamic compression screw and intra medullary rod transfixing an intertrochanteric left femur fracture. There is 1 distal interlocking screw. There is no breakage or loosening of the hardware. Anatomic alignment of the osseous fragments. IMPRESSION: ORIF intertrochanteric left femur fracture. Electronically Signed   By: Marybelle Killings M.D.   On: 10/21/2015 12:55   Dg Hip Unilat With Pelvis 2-3 Views Left  10/20/2015  CLINICAL DATA:  Fall, left hip pain EXAM: DG HIP (WITH OR WITHOUT PELVIS) 2-3V LEFT COMPARISON:  None. FINDINGS: Three views of the left hip submitted. There is mild impacted with minimal angulation intertrochanteric fracture of proximal left femur. IMPRESSION: Mild impacted minimal angulated intertrochanteric fracture of proximal left femur. Electronically Signed   By: Lahoma Crocker M.D.   On: 10/20/2015 17:15    Assessment/Plan:   1.Type 2 DM CBG's average in the 200's continue on Metformin and Lantus SQ. Facility staff to give self administration Lantus SQ injection education to patient and daughter prior to discharge home. PCP to monitor Hgb A1C 2. S/p Hip Fracture  post hospital admission from 10/20/15-10/24/15 with intertrochanteric fracture of left hip post fall. She underwent ORIF.she has worked with PT/OT. Will discharge home to continue with PT/OT for ROM, exercise, gait stability and muscle strengthening.Facility staff to provide levonox injection self administration education to patient and patient's daughter prior to discharge home.  3. Osteoporosis  Continue on weekly Alendronate.  4. Insomnia Continue on Trazodone  4. Essential Tremor Continue on Primidone and Propranolol  5. Depression  Continue on Duloxetine 60 mg Capsule. Monitor for mood changes. PCP to monitor.  6. Anemia  She is S/p ORIF. Hgb has improved since discharge to  rehab recent 9.3 (10/31/2015) previous 8.7 (10/23/2015). Follow up with PCP to monitor Hgb.    Patient is being discharged with the following home health services:     PT/OT for ROM, exercise, gait stability and muscle strengthening.  Patient is being discharged with the following durable medical equipment:   Does not require any DME patient's daughter state patient has own walker/Rollator and wheelchair at home.  Patient has been advised to f/u with their PCP in 1-2 weeks to bring them up to date on their rehab stay.  Social services at facility was responsible for arranging this appointment.  Pt was provided with a 30 day supply of prescriptions for medications and refills must be obtained from their PCP.  For controlled substances, a more limited supply may be provided adequate until PCP appointment only.  Future labs/tests needed:  CBC, BMP with PCP

## 2015-11-10 DIAGNOSIS — G250 Essential tremor: Secondary | ICD-10-CM | POA: Diagnosis not present

## 2015-11-10 DIAGNOSIS — M810 Age-related osteoporosis without current pathological fracture: Secondary | ICD-10-CM | POA: Diagnosis not present

## 2015-11-10 DIAGNOSIS — F329 Major depressive disorder, single episode, unspecified: Secondary | ICD-10-CM | POA: Diagnosis not present

## 2015-11-10 DIAGNOSIS — E1142 Type 2 diabetes mellitus with diabetic polyneuropathy: Secondary | ICD-10-CM | POA: Diagnosis not present

## 2015-11-10 DIAGNOSIS — G2581 Restless legs syndrome: Secondary | ICD-10-CM | POA: Diagnosis not present

## 2015-11-10 DIAGNOSIS — S72002D Fracture of unspecified part of neck of left femur, subsequent encounter for closed fracture with routine healing: Secondary | ICD-10-CM | POA: Diagnosis not present

## 2015-11-10 DIAGNOSIS — D649 Anemia, unspecified: Secondary | ICD-10-CM | POA: Diagnosis not present

## 2015-11-10 DIAGNOSIS — Z794 Long term (current) use of insulin: Secondary | ICD-10-CM | POA: Diagnosis not present

## 2015-11-10 DIAGNOSIS — E1121 Type 2 diabetes mellitus with diabetic nephropathy: Secondary | ICD-10-CM | POA: Diagnosis not present

## 2015-11-11 DIAGNOSIS — Z794 Long term (current) use of insulin: Secondary | ICD-10-CM | POA: Diagnosis not present

## 2015-11-11 DIAGNOSIS — S72002D Fracture of unspecified part of neck of left femur, subsequent encounter for closed fracture with routine healing: Secondary | ICD-10-CM | POA: Diagnosis not present

## 2015-11-11 DIAGNOSIS — G250 Essential tremor: Secondary | ICD-10-CM | POA: Diagnosis not present

## 2015-11-11 DIAGNOSIS — D649 Anemia, unspecified: Secondary | ICD-10-CM | POA: Diagnosis not present

## 2015-11-11 DIAGNOSIS — E1142 Type 2 diabetes mellitus with diabetic polyneuropathy: Secondary | ICD-10-CM | POA: Diagnosis not present

## 2015-11-11 DIAGNOSIS — E1121 Type 2 diabetes mellitus with diabetic nephropathy: Secondary | ICD-10-CM | POA: Diagnosis not present

## 2015-11-11 DIAGNOSIS — G2581 Restless legs syndrome: Secondary | ICD-10-CM | POA: Diagnosis not present

## 2015-11-11 DIAGNOSIS — F329 Major depressive disorder, single episode, unspecified: Secondary | ICD-10-CM | POA: Diagnosis not present

## 2015-11-11 DIAGNOSIS — M810 Age-related osteoporosis without current pathological fracture: Secondary | ICD-10-CM | POA: Diagnosis not present

## 2015-11-14 DIAGNOSIS — I10 Essential (primary) hypertension: Secondary | ICD-10-CM | POA: Diagnosis not present

## 2015-11-14 DIAGNOSIS — G250 Essential tremor: Secondary | ICD-10-CM | POA: Diagnosis not present

## 2015-11-14 DIAGNOSIS — S72009A Fracture of unspecified part of neck of unspecified femur, initial encounter for closed fracture: Secondary | ICD-10-CM | POA: Diagnosis not present

## 2015-11-14 DIAGNOSIS — Z79899 Other long term (current) drug therapy: Secondary | ICD-10-CM | POA: Diagnosis not present

## 2015-11-14 DIAGNOSIS — E119 Type 2 diabetes mellitus without complications: Secondary | ICD-10-CM | POA: Diagnosis not present

## 2015-11-14 DIAGNOSIS — M79605 Pain in left leg: Secondary | ICD-10-CM | POA: Diagnosis not present

## 2015-11-15 DIAGNOSIS — M810 Age-related osteoporosis without current pathological fracture: Secondary | ICD-10-CM | POA: Diagnosis not present

## 2015-11-15 DIAGNOSIS — G250 Essential tremor: Secondary | ICD-10-CM | POA: Diagnosis not present

## 2015-11-15 DIAGNOSIS — S72002D Fracture of unspecified part of neck of left femur, subsequent encounter for closed fracture with routine healing: Secondary | ICD-10-CM | POA: Diagnosis not present

## 2015-11-15 DIAGNOSIS — E1142 Type 2 diabetes mellitus with diabetic polyneuropathy: Secondary | ICD-10-CM | POA: Diagnosis not present

## 2015-11-15 DIAGNOSIS — E1121 Type 2 diabetes mellitus with diabetic nephropathy: Secondary | ICD-10-CM | POA: Diagnosis not present

## 2015-11-15 DIAGNOSIS — Z794 Long term (current) use of insulin: Secondary | ICD-10-CM | POA: Diagnosis not present

## 2015-11-15 DIAGNOSIS — F329 Major depressive disorder, single episode, unspecified: Secondary | ICD-10-CM | POA: Diagnosis not present

## 2015-11-15 DIAGNOSIS — G2581 Restless legs syndrome: Secondary | ICD-10-CM | POA: Diagnosis not present

## 2015-11-15 DIAGNOSIS — D649 Anemia, unspecified: Secondary | ICD-10-CM | POA: Diagnosis not present

## 2015-11-17 DIAGNOSIS — G250 Essential tremor: Secondary | ICD-10-CM | POA: Diagnosis not present

## 2015-11-17 DIAGNOSIS — D649 Anemia, unspecified: Secondary | ICD-10-CM | POA: Diagnosis not present

## 2015-11-17 DIAGNOSIS — E1121 Type 2 diabetes mellitus with diabetic nephropathy: Secondary | ICD-10-CM | POA: Diagnosis not present

## 2015-11-17 DIAGNOSIS — E1142 Type 2 diabetes mellitus with diabetic polyneuropathy: Secondary | ICD-10-CM | POA: Diagnosis not present

## 2015-11-17 DIAGNOSIS — G2581 Restless legs syndrome: Secondary | ICD-10-CM | POA: Diagnosis not present

## 2015-11-17 DIAGNOSIS — Z794 Long term (current) use of insulin: Secondary | ICD-10-CM | POA: Diagnosis not present

## 2015-11-17 DIAGNOSIS — S72002D Fracture of unspecified part of neck of left femur, subsequent encounter for closed fracture with routine healing: Secondary | ICD-10-CM | POA: Diagnosis not present

## 2015-11-17 DIAGNOSIS — F329 Major depressive disorder, single episode, unspecified: Secondary | ICD-10-CM | POA: Diagnosis not present

## 2015-11-17 DIAGNOSIS — M810 Age-related osteoporosis without current pathological fracture: Secondary | ICD-10-CM | POA: Diagnosis not present

## 2015-11-23 DIAGNOSIS — E1142 Type 2 diabetes mellitus with diabetic polyneuropathy: Secondary | ICD-10-CM | POA: Diagnosis not present

## 2015-11-23 DIAGNOSIS — G2581 Restless legs syndrome: Secondary | ICD-10-CM | POA: Diagnosis not present

## 2015-11-23 DIAGNOSIS — M810 Age-related osteoporosis without current pathological fracture: Secondary | ICD-10-CM | POA: Diagnosis not present

## 2015-11-23 DIAGNOSIS — Z794 Long term (current) use of insulin: Secondary | ICD-10-CM | POA: Diagnosis not present

## 2015-11-23 DIAGNOSIS — E1121 Type 2 diabetes mellitus with diabetic nephropathy: Secondary | ICD-10-CM | POA: Diagnosis not present

## 2015-11-23 DIAGNOSIS — S72002D Fracture of unspecified part of neck of left femur, subsequent encounter for closed fracture with routine healing: Secondary | ICD-10-CM | POA: Diagnosis not present

## 2015-11-23 DIAGNOSIS — F329 Major depressive disorder, single episode, unspecified: Secondary | ICD-10-CM | POA: Diagnosis not present

## 2015-11-23 DIAGNOSIS — D649 Anemia, unspecified: Secondary | ICD-10-CM | POA: Diagnosis not present

## 2015-11-23 DIAGNOSIS — G250 Essential tremor: Secondary | ICD-10-CM | POA: Diagnosis not present

## 2015-11-24 DIAGNOSIS — M810 Age-related osteoporosis without current pathological fracture: Secondary | ICD-10-CM | POA: Diagnosis not present

## 2015-11-24 DIAGNOSIS — E1121 Type 2 diabetes mellitus with diabetic nephropathy: Secondary | ICD-10-CM | POA: Diagnosis not present

## 2015-11-24 DIAGNOSIS — S72002D Fracture of unspecified part of neck of left femur, subsequent encounter for closed fracture with routine healing: Secondary | ICD-10-CM | POA: Diagnosis not present

## 2015-11-24 DIAGNOSIS — D649 Anemia, unspecified: Secondary | ICD-10-CM | POA: Diagnosis not present

## 2015-11-24 DIAGNOSIS — G2581 Restless legs syndrome: Secondary | ICD-10-CM | POA: Diagnosis not present

## 2015-11-24 DIAGNOSIS — E1142 Type 2 diabetes mellitus with diabetic polyneuropathy: Secondary | ICD-10-CM | POA: Diagnosis not present

## 2015-11-24 DIAGNOSIS — G250 Essential tremor: Secondary | ICD-10-CM | POA: Diagnosis not present

## 2015-11-24 DIAGNOSIS — F329 Major depressive disorder, single episode, unspecified: Secondary | ICD-10-CM | POA: Diagnosis not present

## 2015-11-24 DIAGNOSIS — Z794 Long term (current) use of insulin: Secondary | ICD-10-CM | POA: Diagnosis not present

## 2015-11-26 DIAGNOSIS — F329 Major depressive disorder, single episode, unspecified: Secondary | ICD-10-CM | POA: Diagnosis not present

## 2015-11-26 DIAGNOSIS — G2581 Restless legs syndrome: Secondary | ICD-10-CM | POA: Diagnosis not present

## 2015-11-26 DIAGNOSIS — S72002D Fracture of unspecified part of neck of left femur, subsequent encounter for closed fracture with routine healing: Secondary | ICD-10-CM | POA: Diagnosis not present

## 2015-11-26 DIAGNOSIS — E1121 Type 2 diabetes mellitus with diabetic nephropathy: Secondary | ICD-10-CM | POA: Diagnosis not present

## 2015-11-26 DIAGNOSIS — M810 Age-related osteoporosis without current pathological fracture: Secondary | ICD-10-CM | POA: Diagnosis not present

## 2015-11-26 DIAGNOSIS — Z794 Long term (current) use of insulin: Secondary | ICD-10-CM | POA: Diagnosis not present

## 2015-11-26 DIAGNOSIS — D649 Anemia, unspecified: Secondary | ICD-10-CM | POA: Diagnosis not present

## 2015-11-26 DIAGNOSIS — E1142 Type 2 diabetes mellitus with diabetic polyneuropathy: Secondary | ICD-10-CM | POA: Diagnosis not present

## 2015-11-26 DIAGNOSIS — G250 Essential tremor: Secondary | ICD-10-CM | POA: Diagnosis not present

## 2015-11-28 DIAGNOSIS — G250 Essential tremor: Secondary | ICD-10-CM | POA: Diagnosis not present

## 2015-11-28 DIAGNOSIS — M810 Age-related osteoporosis without current pathological fracture: Secondary | ICD-10-CM | POA: Diagnosis not present

## 2015-11-28 DIAGNOSIS — Z794 Long term (current) use of insulin: Secondary | ICD-10-CM | POA: Diagnosis not present

## 2015-11-28 DIAGNOSIS — E1142 Type 2 diabetes mellitus with diabetic polyneuropathy: Secondary | ICD-10-CM | POA: Diagnosis not present

## 2015-11-28 DIAGNOSIS — D649 Anemia, unspecified: Secondary | ICD-10-CM | POA: Diagnosis not present

## 2015-11-28 DIAGNOSIS — E1121 Type 2 diabetes mellitus with diabetic nephropathy: Secondary | ICD-10-CM | POA: Diagnosis not present

## 2015-11-28 DIAGNOSIS — S72002D Fracture of unspecified part of neck of left femur, subsequent encounter for closed fracture with routine healing: Secondary | ICD-10-CM | POA: Diagnosis not present

## 2015-11-28 DIAGNOSIS — F329 Major depressive disorder, single episode, unspecified: Secondary | ICD-10-CM | POA: Diagnosis not present

## 2015-11-28 DIAGNOSIS — G2581 Restless legs syndrome: Secondary | ICD-10-CM | POA: Diagnosis not present

## 2015-11-29 DIAGNOSIS — G250 Essential tremor: Secondary | ICD-10-CM | POA: Diagnosis not present

## 2015-11-29 DIAGNOSIS — E1121 Type 2 diabetes mellitus with diabetic nephropathy: Secondary | ICD-10-CM | POA: Diagnosis not present

## 2015-11-29 DIAGNOSIS — E1142 Type 2 diabetes mellitus with diabetic polyneuropathy: Secondary | ICD-10-CM | POA: Diagnosis not present

## 2015-11-29 DIAGNOSIS — M810 Age-related osteoporosis without current pathological fracture: Secondary | ICD-10-CM | POA: Diagnosis not present

## 2015-11-29 DIAGNOSIS — S72002D Fracture of unspecified part of neck of left femur, subsequent encounter for closed fracture with routine healing: Secondary | ICD-10-CM | POA: Diagnosis not present

## 2015-11-29 DIAGNOSIS — D649 Anemia, unspecified: Secondary | ICD-10-CM | POA: Diagnosis not present

## 2015-11-29 DIAGNOSIS — G2581 Restless legs syndrome: Secondary | ICD-10-CM | POA: Diagnosis not present

## 2015-11-29 DIAGNOSIS — Z794 Long term (current) use of insulin: Secondary | ICD-10-CM | POA: Diagnosis not present

## 2015-11-29 DIAGNOSIS — F329 Major depressive disorder, single episode, unspecified: Secondary | ICD-10-CM | POA: Diagnosis not present

## 2015-11-30 DIAGNOSIS — L89610 Pressure ulcer of right heel, unstageable: Secondary | ICD-10-CM | POA: Diagnosis not present

## 2015-12-01 DIAGNOSIS — D649 Anemia, unspecified: Secondary | ICD-10-CM | POA: Diagnosis not present

## 2015-12-01 DIAGNOSIS — E1142 Type 2 diabetes mellitus with diabetic polyneuropathy: Secondary | ICD-10-CM | POA: Diagnosis not present

## 2015-12-01 DIAGNOSIS — Z794 Long term (current) use of insulin: Secondary | ICD-10-CM | POA: Diagnosis not present

## 2015-12-01 DIAGNOSIS — S72002D Fracture of unspecified part of neck of left femur, subsequent encounter for closed fracture with routine healing: Secondary | ICD-10-CM | POA: Diagnosis not present

## 2015-12-01 DIAGNOSIS — G2581 Restless legs syndrome: Secondary | ICD-10-CM | POA: Diagnosis not present

## 2015-12-01 DIAGNOSIS — F329 Major depressive disorder, single episode, unspecified: Secondary | ICD-10-CM | POA: Diagnosis not present

## 2015-12-01 DIAGNOSIS — E1121 Type 2 diabetes mellitus with diabetic nephropathy: Secondary | ICD-10-CM | POA: Diagnosis not present

## 2015-12-01 DIAGNOSIS — G250 Essential tremor: Secondary | ICD-10-CM | POA: Diagnosis not present

## 2015-12-01 DIAGNOSIS — M810 Age-related osteoporosis without current pathological fracture: Secondary | ICD-10-CM | POA: Diagnosis not present

## 2015-12-02 DIAGNOSIS — Z794 Long term (current) use of insulin: Secondary | ICD-10-CM | POA: Diagnosis not present

## 2015-12-02 DIAGNOSIS — D649 Anemia, unspecified: Secondary | ICD-10-CM | POA: Diagnosis not present

## 2015-12-02 DIAGNOSIS — S72002D Fracture of unspecified part of neck of left femur, subsequent encounter for closed fracture with routine healing: Secondary | ICD-10-CM | POA: Diagnosis not present

## 2015-12-02 DIAGNOSIS — G2581 Restless legs syndrome: Secondary | ICD-10-CM | POA: Diagnosis not present

## 2015-12-02 DIAGNOSIS — G250 Essential tremor: Secondary | ICD-10-CM | POA: Diagnosis not present

## 2015-12-02 DIAGNOSIS — M810 Age-related osteoporosis without current pathological fracture: Secondary | ICD-10-CM | POA: Diagnosis not present

## 2015-12-02 DIAGNOSIS — E1121 Type 2 diabetes mellitus with diabetic nephropathy: Secondary | ICD-10-CM | POA: Diagnosis not present

## 2015-12-02 DIAGNOSIS — F329 Major depressive disorder, single episode, unspecified: Secondary | ICD-10-CM | POA: Diagnosis not present

## 2015-12-02 DIAGNOSIS — E1142 Type 2 diabetes mellitus with diabetic polyneuropathy: Secondary | ICD-10-CM | POA: Diagnosis not present

## 2015-12-05 DIAGNOSIS — Z967 Presence of other bone and tendon implants: Secondary | ICD-10-CM | POA: Diagnosis not present

## 2015-12-05 DIAGNOSIS — Z8781 Personal history of (healed) traumatic fracture: Secondary | ICD-10-CM | POA: Diagnosis not present

## 2015-12-06 DIAGNOSIS — D649 Anemia, unspecified: Secondary | ICD-10-CM | POA: Diagnosis not present

## 2015-12-06 DIAGNOSIS — Z794 Long term (current) use of insulin: Secondary | ICD-10-CM | POA: Diagnosis not present

## 2015-12-06 DIAGNOSIS — M810 Age-related osteoporosis without current pathological fracture: Secondary | ICD-10-CM | POA: Diagnosis not present

## 2015-12-06 DIAGNOSIS — G250 Essential tremor: Secondary | ICD-10-CM | POA: Diagnosis not present

## 2015-12-06 DIAGNOSIS — S72002D Fracture of unspecified part of neck of left femur, subsequent encounter for closed fracture with routine healing: Secondary | ICD-10-CM | POA: Diagnosis not present

## 2015-12-06 DIAGNOSIS — F329 Major depressive disorder, single episode, unspecified: Secondary | ICD-10-CM | POA: Diagnosis not present

## 2015-12-06 DIAGNOSIS — E1121 Type 2 diabetes mellitus with diabetic nephropathy: Secondary | ICD-10-CM | POA: Diagnosis not present

## 2015-12-06 DIAGNOSIS — E1142 Type 2 diabetes mellitus with diabetic polyneuropathy: Secondary | ICD-10-CM | POA: Diagnosis not present

## 2015-12-06 DIAGNOSIS — G2581 Restless legs syndrome: Secondary | ICD-10-CM | POA: Diagnosis not present

## 2015-12-07 DIAGNOSIS — D649 Anemia, unspecified: Secondary | ICD-10-CM | POA: Diagnosis not present

## 2015-12-07 DIAGNOSIS — E1142 Type 2 diabetes mellitus with diabetic polyneuropathy: Secondary | ICD-10-CM | POA: Diagnosis not present

## 2015-12-07 DIAGNOSIS — G2581 Restless legs syndrome: Secondary | ICD-10-CM | POA: Diagnosis not present

## 2015-12-07 DIAGNOSIS — M810 Age-related osteoporosis without current pathological fracture: Secondary | ICD-10-CM | POA: Diagnosis not present

## 2015-12-07 DIAGNOSIS — Z794 Long term (current) use of insulin: Secondary | ICD-10-CM | POA: Diagnosis not present

## 2015-12-07 DIAGNOSIS — G250 Essential tremor: Secondary | ICD-10-CM | POA: Diagnosis not present

## 2015-12-07 DIAGNOSIS — E1121 Type 2 diabetes mellitus with diabetic nephropathy: Secondary | ICD-10-CM | POA: Diagnosis not present

## 2015-12-07 DIAGNOSIS — S72002D Fracture of unspecified part of neck of left femur, subsequent encounter for closed fracture with routine healing: Secondary | ICD-10-CM | POA: Diagnosis not present

## 2015-12-07 DIAGNOSIS — F329 Major depressive disorder, single episode, unspecified: Secondary | ICD-10-CM | POA: Diagnosis not present

## 2015-12-09 DIAGNOSIS — G250 Essential tremor: Secondary | ICD-10-CM | POA: Diagnosis not present

## 2015-12-09 DIAGNOSIS — D649 Anemia, unspecified: Secondary | ICD-10-CM | POA: Diagnosis not present

## 2015-12-09 DIAGNOSIS — S72002D Fracture of unspecified part of neck of left femur, subsequent encounter for closed fracture with routine healing: Secondary | ICD-10-CM | POA: Diagnosis not present

## 2015-12-09 DIAGNOSIS — F329 Major depressive disorder, single episode, unspecified: Secondary | ICD-10-CM | POA: Diagnosis not present

## 2015-12-09 DIAGNOSIS — M810 Age-related osteoporosis without current pathological fracture: Secondary | ICD-10-CM | POA: Diagnosis not present

## 2015-12-09 DIAGNOSIS — E1121 Type 2 diabetes mellitus with diabetic nephropathy: Secondary | ICD-10-CM | POA: Diagnosis not present

## 2015-12-09 DIAGNOSIS — E1142 Type 2 diabetes mellitus with diabetic polyneuropathy: Secondary | ICD-10-CM | POA: Diagnosis not present

## 2015-12-09 DIAGNOSIS — Z794 Long term (current) use of insulin: Secondary | ICD-10-CM | POA: Diagnosis not present

## 2015-12-09 DIAGNOSIS — G2581 Restless legs syndrome: Secondary | ICD-10-CM | POA: Diagnosis not present

## 2015-12-14 DIAGNOSIS — E119 Type 2 diabetes mellitus without complications: Secondary | ICD-10-CM | POA: Diagnosis not present

## 2015-12-14 DIAGNOSIS — R32 Unspecified urinary incontinence: Secondary | ICD-10-CM | POA: Diagnosis not present

## 2015-12-14 DIAGNOSIS — G250 Essential tremor: Secondary | ICD-10-CM | POA: Diagnosis not present

## 2015-12-14 DIAGNOSIS — L89151 Pressure ulcer of sacral region, stage 1: Secondary | ICD-10-CM | POA: Diagnosis not present

## 2015-12-14 DIAGNOSIS — I739 Peripheral vascular disease, unspecified: Secondary | ICD-10-CM | POA: Diagnosis not present

## 2015-12-14 DIAGNOSIS — L89610 Pressure ulcer of right heel, unstageable: Secondary | ICD-10-CM | POA: Diagnosis not present

## 2015-12-16 DIAGNOSIS — E1142 Type 2 diabetes mellitus with diabetic polyneuropathy: Secondary | ICD-10-CM | POA: Diagnosis not present

## 2015-12-16 DIAGNOSIS — G250 Essential tremor: Secondary | ICD-10-CM | POA: Diagnosis not present

## 2015-12-16 DIAGNOSIS — D649 Anemia, unspecified: Secondary | ICD-10-CM | POA: Diagnosis not present

## 2015-12-16 DIAGNOSIS — G2581 Restless legs syndrome: Secondary | ICD-10-CM | POA: Diagnosis not present

## 2015-12-16 DIAGNOSIS — F329 Major depressive disorder, single episode, unspecified: Secondary | ICD-10-CM | POA: Diagnosis not present

## 2015-12-16 DIAGNOSIS — Z794 Long term (current) use of insulin: Secondary | ICD-10-CM | POA: Diagnosis not present

## 2015-12-16 DIAGNOSIS — M810 Age-related osteoporosis without current pathological fracture: Secondary | ICD-10-CM | POA: Diagnosis not present

## 2015-12-16 DIAGNOSIS — S72002D Fracture of unspecified part of neck of left femur, subsequent encounter for closed fracture with routine healing: Secondary | ICD-10-CM | POA: Diagnosis not present

## 2015-12-16 DIAGNOSIS — E1121 Type 2 diabetes mellitus with diabetic nephropathy: Secondary | ICD-10-CM | POA: Diagnosis not present

## 2016-01-02 DIAGNOSIS — G250 Essential tremor: Secondary | ICD-10-CM | POA: Diagnosis not present

## 2016-02-13 DIAGNOSIS — E7800 Pure hypercholesterolemia, unspecified: Secondary | ICD-10-CM | POA: Diagnosis not present

## 2016-02-13 DIAGNOSIS — M79604 Pain in right leg: Secondary | ICD-10-CM | POA: Diagnosis not present

## 2016-02-13 DIAGNOSIS — I10 Essential (primary) hypertension: Secondary | ICD-10-CM | POA: Diagnosis not present

## 2016-02-13 DIAGNOSIS — F329 Major depressive disorder, single episode, unspecified: Secondary | ICD-10-CM | POA: Diagnosis not present

## 2016-02-13 DIAGNOSIS — I69359 Hemiplegia and hemiparesis following cerebral infarction affecting unspecified side: Secondary | ICD-10-CM | POA: Diagnosis not present

## 2016-02-13 DIAGNOSIS — E119 Type 2 diabetes mellitus without complications: Secondary | ICD-10-CM | POA: Diagnosis not present

## 2016-03-30 ENCOUNTER — Emergency Department: Payer: Commercial Managed Care - HMO

## 2016-03-30 ENCOUNTER — Inpatient Hospital Stay
Admission: EM | Admit: 2016-03-30 | Discharge: 2016-04-03 | DRG: 481 | Disposition: A | Discharge status: Skilled Nursing Facility | Payer: Commercial Managed Care - HMO | Attending: Internal Medicine | Admitting: Internal Medicine

## 2016-03-30 ENCOUNTER — Inpatient Hospital Stay: Payer: Commercial Managed Care - HMO

## 2016-03-30 ENCOUNTER — Encounter: Payer: Self-pay | Admitting: Emergency Medicine

## 2016-03-30 DIAGNOSIS — Z01818 Encounter for other preprocedural examination: Secondary | ICD-10-CM | POA: Diagnosis not present

## 2016-03-30 DIAGNOSIS — S72001A Fracture of unspecified part of neck of right femur, initial encounter for closed fracture: Secondary | ICD-10-CM

## 2016-03-30 DIAGNOSIS — S72141A Displaced intertrochanteric fracture of right femur, initial encounter for closed fracture: Principal | ICD-10-CM | POA: Diagnosis present

## 2016-03-30 DIAGNOSIS — M25561 Pain in right knee: Secondary | ICD-10-CM | POA: Diagnosis not present

## 2016-03-30 DIAGNOSIS — M810 Age-related osteoporosis without current pathological fracture: Secondary | ICD-10-CM | POA: Diagnosis present

## 2016-03-30 DIAGNOSIS — Z79899 Other long term (current) drug therapy: Secondary | ICD-10-CM | POA: Diagnosis not present

## 2016-03-30 DIAGNOSIS — F329 Major depressive disorder, single episode, unspecified: Secondary | ICD-10-CM | POA: Diagnosis not present

## 2016-03-30 DIAGNOSIS — I10 Essential (primary) hypertension: Secondary | ICD-10-CM

## 2016-03-30 DIAGNOSIS — G2581 Restless legs syndrome: Secondary | ICD-10-CM | POA: Diagnosis not present

## 2016-03-30 DIAGNOSIS — W010XXA Fall on same level from slipping, tripping and stumbling without subsequent striking against object, initial encounter: Secondary | ICD-10-CM | POA: Diagnosis present

## 2016-03-30 DIAGNOSIS — E871 Hypo-osmolality and hyponatremia: Secondary | ICD-10-CM | POA: Diagnosis not present

## 2016-03-30 DIAGNOSIS — E119 Type 2 diabetes mellitus without complications: Secondary | ICD-10-CM | POA: Diagnosis not present

## 2016-03-30 DIAGNOSIS — Z7984 Long term (current) use of oral hypoglycemic drugs: Secondary | ICD-10-CM

## 2016-03-30 DIAGNOSIS — Z8249 Family history of ischemic heart disease and other diseases of the circulatory system: Secondary | ICD-10-CM

## 2016-03-30 DIAGNOSIS — G250 Essential tremor: Secondary | ICD-10-CM | POA: Diagnosis not present

## 2016-03-30 DIAGNOSIS — S72144A Nondisplaced intertrochanteric fracture of right femur, initial encounter for closed fracture: Secondary | ICD-10-CM | POA: Diagnosis not present

## 2016-03-30 DIAGNOSIS — M25559 Pain in unspecified hip: Secondary | ICD-10-CM | POA: Diagnosis not present

## 2016-03-30 DIAGNOSIS — S72141D Displaced intertrochanteric fracture of right femur, subsequent encounter for closed fracture with routine healing: Secondary | ICD-10-CM | POA: Diagnosis not present

## 2016-03-30 DIAGNOSIS — Z9889 Other specified postprocedural states: Secondary | ICD-10-CM

## 2016-03-30 DIAGNOSIS — D62 Acute posthemorrhagic anemia: Secondary | ICD-10-CM | POA: Diagnosis not present

## 2016-03-30 DIAGNOSIS — IMO0001 Reserved for inherently not codable concepts without codable children: Secondary | ICD-10-CM

## 2016-03-30 DIAGNOSIS — Y92512 Supermarket, store or market as the place of occurrence of the external cause: Secondary | ICD-10-CM

## 2016-03-30 DIAGNOSIS — S72143A Displaced intertrochanteric fracture of unspecified femur, initial encounter for closed fracture: Secondary | ICD-10-CM | POA: Diagnosis present

## 2016-03-30 DIAGNOSIS — Z8673 Personal history of transient ischemic attack (TIA), and cerebral infarction without residual deficits: Secondary | ICD-10-CM

## 2016-03-30 DIAGNOSIS — M25461 Effusion, right knee: Secondary | ICD-10-CM | POA: Diagnosis not present

## 2016-03-30 DIAGNOSIS — S8991XA Unspecified injury of right lower leg, initial encounter: Secondary | ICD-10-CM | POA: Diagnosis not present

## 2016-03-30 DIAGNOSIS — Z7983 Long term (current) use of bisphosphonates: Secondary | ICD-10-CM | POA: Diagnosis not present

## 2016-03-30 DIAGNOSIS — D649 Anemia, unspecified: Secondary | ICD-10-CM | POA: Diagnosis not present

## 2016-03-30 DIAGNOSIS — Z8781 Personal history of (healed) traumatic fracture: Secondary | ICD-10-CM

## 2016-03-30 DIAGNOSIS — E1140 Type 2 diabetes mellitus with diabetic neuropathy, unspecified: Secondary | ICD-10-CM | POA: Diagnosis not present

## 2016-03-30 DIAGNOSIS — I517 Cardiomegaly: Secondary | ICD-10-CM | POA: Diagnosis not present

## 2016-03-30 DIAGNOSIS — Z7401 Bed confinement status: Secondary | ICD-10-CM | POA: Diagnosis not present

## 2016-03-30 DIAGNOSIS — W19XXXD Unspecified fall, subsequent encounter: Secondary | ICD-10-CM | POA: Diagnosis not present

## 2016-03-30 HISTORY — DX: Fracture of unspecified part of neck of unspecified femur, initial encounter for closed fracture: S72.009A

## 2016-03-30 LAB — COMPREHENSIVE METABOLIC PANEL
ALBUMIN: 3.9 g/dL (ref 3.5–5.0)
ALK PHOS: 69 U/L (ref 38–126)
ALT: 14 U/L (ref 14–54)
ANION GAP: 8 (ref 5–15)
AST: 25 U/L (ref 15–41)
BUN: 18 mg/dL (ref 6–20)
CALCIUM: 9.2 mg/dL (ref 8.9–10.3)
CHLORIDE: 102 mmol/L (ref 101–111)
CO2: 25 mmol/L (ref 22–32)
CREATININE: 0.81 mg/dL (ref 0.44–1.00)
GFR calc non Af Amer: >60 mL/min (ref >60)
GLUCOSE: 172 mg/dL — AB (ref 65–99)
Potassium: 4.4 mmol/L (ref 3.5–5.1)
SODIUM: 135 mmol/L (ref 135–145)
Total Bilirubin: 0.5 mg/dL (ref 0.3–1.2)
Total Protein: 7.2 g/dL (ref 6.5–8.1)

## 2016-03-30 LAB — CBC WITH DIFFERENTIAL/PLATELET
BASOS PCT: 1 %
Basophils Absolute: 0 10*3/uL (ref 0–0.1)
Eosinophils Absolute: 0.1 10*3/uL (ref 0–0.7)
Eosinophils Relative: 1 %
HEMATOCRIT: 34.9 % — AB (ref 35.0–47.0)
HEMOGLOBIN: 12 g/dL (ref 12.0–16.0)
LYMPHS ABS: 1.5 10*3/uL (ref 1.0–3.6)
Lymphocytes Relative: 23 %
MCH: 30.8 pg (ref 26.0–34.0)
MCHC: 34.4 g/dL (ref 32.0–36.0)
MCV: 89.4 fL (ref 80.0–100.0)
MONO ABS: 0.5 10*3/uL (ref 0.2–0.9)
MONOS PCT: 7 %
NEUTROS ABS: 4.7 10*3/uL (ref 1.4–6.5)
NEUTROS PCT: 68 %
Platelets: 203 10*3/uL (ref 150–440)
RBC: 3.91 MIL/uL (ref 3.80–5.20)
RDW: 15.7 % — AB (ref 11.5–14.5)
WBC: 6.7 10*3/uL (ref 3.6–11.0)

## 2016-03-30 LAB — URINALYSIS COMPLETE WITH MICROSCOPIC (ARMC ONLY)
BILIRUBIN URINE: NEGATIVE
Glucose, UA: NEGATIVE mg/dL
HGB URINE DIPSTICK: NEGATIVE
KETONES UR: NEGATIVE mg/dL
LEUKOCYTES UA: NEGATIVE
NITRITE: NEGATIVE
PH: 5 (ref 5.0–8.0)
Protein, ur: NEGATIVE mg/dL
Specific Gravity, Urine: 1.013 (ref 1.005–1.030)

## 2016-03-30 LAB — SURGICAL PCR SCREEN
MRSA, PCR: NEGATIVE
Staphylococcus aureus: POSITIVE — AB

## 2016-03-30 LAB — PROTIME-INR
INR: 1.08
Prothrombin Time: 14 seconds (ref 11.4–15.2)

## 2016-03-30 LAB — GLUCOSE, CAPILLARY: Glucose-Capillary: 177 mg/dL — ABNORMAL HIGH (ref 65–99)

## 2016-03-30 LAB — TYPE AND SCREEN
ABO/RH(D): O POS
Antibody Screen: NEGATIVE

## 2016-03-30 MED ORDER — PRIMIDONE 50 MG PO TABS
50.0000 mg | ORAL_TABLET | Freq: Every day | ORAL | Status: DC
  Administered 2016-03-30 – 2016-04-02 (×4): 50 mg via ORAL
  Filled 2016-03-30 (×4): qty 1

## 2016-03-30 MED ORDER — ATORVASTATIN CALCIUM 10 MG PO TABS
10.0000 mg | ORAL_TABLET | Freq: Every day | ORAL | Status: DC
  Administered 2016-03-30 – 2016-04-02 (×4): 10 mg via ORAL
  Filled 2016-03-30 (×4): qty 1

## 2016-03-30 MED ORDER — INSULIN ASPART 100 UNIT/ML ~~LOC~~ SOLN
0.0000 [IU] | Freq: Three times a day (TID) | SUBCUTANEOUS | Status: DC
  Administered 2016-03-31 – 2016-04-02 (×5): 2 [IU] via SUBCUTANEOUS
  Administered 2016-04-03: 1 [IU] via SUBCUTANEOUS
  Filled 2016-03-30: qty 1
  Filled 2016-03-30 (×3): qty 2
  Filled 2016-03-30: qty 1
  Filled 2016-03-30: qty 2

## 2016-03-30 MED ORDER — ONDANSETRON HCL 4 MG PO TABS: 4.0000 mg | ORAL_TABLET | Freq: Four times a day (QID) | ORAL | Status: DC | PRN

## 2016-03-30 MED ORDER — POLYETHYLENE GLYCOL 3350 17 G PO PACK: 17.0000 g | PACK | Freq: Every day | ORAL | Status: DC | PRN

## 2016-03-30 MED ORDER — BACLOFEN 10 MG PO TABS: 10.0000 mg | ORAL_TABLET | Freq: Three times a day (TID) | ORAL | Status: DC | PRN

## 2016-03-30 MED ORDER — MORPHINE SULFATE (PF) 2 MG/ML IV SOLN
2.0000 mg | INTRAVENOUS | Status: DC | PRN
  Administered 2016-03-30 – 2016-03-31 (×3): 2 mg via INTRAVENOUS
  Filled 2016-03-30 (×3): qty 1

## 2016-03-30 MED ORDER — SENNA 8.6 MG PO TABS
2.0000 | ORAL_TABLET | Freq: Every day | ORAL | Status: DC
  Administered 2016-03-30: 17.2 mg via ORAL
  Filled 2016-03-30: qty 2

## 2016-03-30 MED ORDER — CEFAZOLIN SODIUM-DEXTROSE 2-4 GM/100ML-% IV SOLN
2.0000 g | INTRAVENOUS | Status: AC
  Administered 2016-03-31: 2 g via INTRAVENOUS
  Filled 2016-03-30: qty 100

## 2016-03-30 MED ORDER — PROPRANOLOL HCL 40 MG PO TABS
80.0000 mg | ORAL_TABLET | Freq: Every day | ORAL | Status: DC
  Administered 2016-03-30: 80 mg via ORAL
  Filled 2016-03-30: qty 2

## 2016-03-30 MED ORDER — MORPHINE SULFATE (PF) 2 MG/ML IV SOLN
2.0000 mg | Freq: Once | INTRAVENOUS | Status: AC
  Administered 2016-03-30: 2 mg via INTRAVENOUS

## 2016-03-30 MED ORDER — CHLORHEXIDINE GLUCONATE CLOTH 2 % EX PADS
6.0000 | MEDICATED_PAD | Freq: Every day | CUTANEOUS | Status: DC
  Administered 2016-03-30: 6 via TOPICAL

## 2016-03-30 MED ORDER — DULOXETINE HCL 60 MG PO CPEP
60.0000 mg | ORAL_CAPSULE | Freq: Every day | ORAL | Status: DC
  Administered 2016-04-01 – 2016-04-03 (×3): 60 mg via ORAL
  Filled 2016-03-30 (×3): qty 1

## 2016-03-30 MED ORDER — INSULIN ASPART 100 UNIT/ML ~~LOC~~ SOLN: 0.0000 [IU] | Freq: Every day | SUBCUTANEOUS | Status: DC

## 2016-03-30 MED ORDER — DOCUSATE SODIUM 100 MG PO CAPS
100.0000 mg | ORAL_CAPSULE | Freq: Two times a day (BID) | ORAL | Status: DC
  Administered 2016-03-30: 100 mg via ORAL
  Filled 2016-03-30: qty 1

## 2016-03-30 MED ORDER — SODIUM CHLORIDE 0.9 % IV SOLN
INTRAVENOUS | Status: DC
  Administered 2016-03-30 – 2016-03-31 (×2): via INTRAVENOUS

## 2016-03-30 MED ORDER — OXYCODONE HCL 5 MG PO TABS
5.0000 mg | ORAL_TABLET | ORAL | Status: DC | PRN
  Administered 2016-03-30 – 2016-03-31 (×2): 5 mg via ORAL
  Filled 2016-03-30 (×3): qty 1

## 2016-03-30 MED ORDER — ONDANSETRON HCL 4 MG/2ML IJ SOLN
4.0000 mg | Freq: Four times a day (QID) | INTRAMUSCULAR | Status: DC | PRN
  Administered 2016-03-31: 4 mg via INTRAVENOUS
  Filled 2016-03-30: qty 2

## 2016-03-30 MED ORDER — ACETAMINOPHEN 650 MG RE SUPP: 650.0000 mg | Freq: Four times a day (QID) | RECTAL | Status: DC | PRN

## 2016-03-30 MED ORDER — MUPIROCIN 2 % EX OINT
1.0000 "application " | TOPICAL_OINTMENT | Freq: Two times a day (BID) | CUTANEOUS | Status: DC
  Administered 2016-03-30: 1 via NASAL
  Filled 2016-03-30: qty 22

## 2016-03-30 MED ORDER — TRAZODONE HCL 100 MG PO TABS
100.0000 mg | ORAL_TABLET | Freq: Every evening | ORAL | Status: DC | PRN
  Administered 2016-03-30 – 2016-04-01 (×2): 100 mg via ORAL
  Filled 2016-03-30 (×2): qty 1

## 2016-03-30 MED ORDER — VITAMIN D 1000 UNITS PO TABS
1000.0000 [IU] | ORAL_TABLET | Freq: Every day | ORAL | Status: DC
  Administered 2016-03-30 – 2016-04-03 (×4): 1000 [IU] via ORAL
  Filled 2016-03-30 (×9): qty 1

## 2016-03-30 MED ORDER — MORPHINE SULFATE (PF) 2 MG/ML IV SOLN
INTRAVENOUS | Status: AC
  Administered 2016-03-30: 2 mg via INTRAVENOUS
  Filled 2016-03-30: qty 1

## 2016-03-30 MED ORDER — GABAPENTIN 600 MG PO TABS
600.0000 mg | ORAL_TABLET | Freq: Three times a day (TID) | ORAL | Status: DC
  Administered 2016-03-30: 600 mg via ORAL
  Filled 2016-03-30: qty 1

## 2016-03-30 MED ORDER — ACETAMINOPHEN 325 MG PO TABS
650.0000 mg | ORAL_TABLET | Freq: Four times a day (QID) | ORAL | Status: DC | PRN
  Filled 2016-03-30: qty 2

## 2016-03-30 NOTE — ED Provider Notes (Signed)
[facility] Emergency Department Provider Note  ____________________________________________   First MD Initiated Contact with Patient 03/30/16 1521     (approximate)  I have reviewed the triage vital signs and the nursing notes.   HISTORY  Chief Complaint Leg Pain    HPI Carrie Bishop is a 82 y.o. female with a history of left hip fracture status post surgery by Dr. Rudene Christians approximately 5 months ago who presents by EMS for evaluation of acute onset severe pain in her right hip and right knee today after mechanical fall.  Rest that she was ambulating with her walker when it went out from underneath her and she fell onto her right hip.  The pain is severe and sharp and aching and made worse with any attempted movement of her right leg.  She did not strike her head and has no headache, neck pain, nor loss of consciousness.  She denies chest pain, shortness of breath, nausea, vomiting, abdominal pain.She reports that she does not take any anticoagulation medications.   Past Medical History:  Diagnosis Date  . Depression   . Diabetes mellitus without complication ([place])   . Hip fracture [facility])    left March 2017  . Hypertension   . Neuropathy ([place])   . Osteoporosis   . Restless leg syndrome   . Stroke ([place])   . Tremor     Patient Active Problem List   Diagnosis Date Noted  . Closed intertrochanteric fracture of femur ([place]) 03/30/2016  . Osteoporosis 11/07/2015  . Insomnia 11/07/2015  . Neuropathic pain 11/07/2015  . Depression 11/07/2015  . DM type 2 (diabetes mellitus, type 2) ([place]) 11/01/2015  . Intertrochanteric fracture of left hip ([place]) 10/20/2015  . Chronic renal insufficiency 10/20/2015  . Anemia 10/20/2015  . Closed left hip fracture ([place]) 10/20/2015  . Benign essential tremor 09/20/2015    Past Surgical History:  Procedure Laterality Date  . ABDOMINAL HYSTERECTOMY    . arm surgery    . HIP FRACTURE SURGERY Left 09/2015  .  INTRAMEDULLARY (IM) NAIL INTERTROCHANTERIC Left 10/21/2015   Procedure: INTRAMEDULLARY (IM) NAIL INTERTROCHANTRIC LEFT LONG AFFIXUS;  Surgeon: Hessie Knows, MD;  Location: [facility] ORS;  Service: Orthopedics;  Laterality: Left;  . LEG SURGERY      Prior to Admission medications   Medication Sig Start Date End Date Taking? Authorizing Provider  cholecalciferol (VITAMIN D) 1000 units tablet Take 1,000 Units by mouth daily.   Yes Historical Provider, MD  DULoxetine (CYMBALTA) 60 MG capsule Take 60 mg by mouth daily.    Yes Historical Provider, MD  gabapentin (NEURONTIN) 600 MG tablet Take 600 mg by mouth 3 (three) times daily.   Yes Historical Provider, MD  lisinopril (PRINIVIL,ZESTRIL) 40 MG tablet Take 1 tablet by mouth at bedtime.  09/26/15  Yes Historical Provider, MD  metFORMIN (GLUCOPHAGE) 500 MG tablet Take 1,500 mg by mouth at bedtime.   Yes Historical Provider, MD  pravastatin (PRAVACHOL) 40 MG tablet Take 40 mg by mouth daily.   Yes Historical Provider, MD  primidone (MYSOLINE) 50 MG tablet Take 100 mg by mouth at bedtime.    Yes Historical Provider, MD  traMADol (ULTRAM) 50 MG tablet Take 100 mg by mouth at bedtime.   Yes Historical Provider, MD  traZODone (DESYREL) 100 MG tablet Take 100-200 mg by mouth at bedtime as needed for sleep.   Yes Historical Provider, MD  alendronate (FOSAMAX) 70 MG tablet Take 70 mg by mouth once a week. Take with  a full glass of water on an empty stomach. On Fridays.    Historical Provider, MD    Allergies Review of patient's allergies indicates no known allergies.  Family History  Problem Relation Age of Onset  . Hypertension Other     Social History Social History  Substance Use Topics  . Smoking status: Never Smoker  . Smokeless tobacco: Never Used  . Alcohol use No    Review of Systems Constitutional: No fever/chills Eyes: No visual changes. ENT: No sore throat. Cardiovascular: Denies chest pain. Respiratory: Denies shortness of  breath. Gastrointestinal: No abdominal pain.  No nausea, no vomiting.  No diarrhea.  No constipation. Genitourinary: Negative for dysuria. Musculoskeletal: Severe pain in right leg status post fall. Skin: Negative for rash. Neurological: Negative for headaches, focal weakness or numbness.  10-point ROS otherwise negative.  ____________________________________________   PHYSICAL EXAM:  VITAL SIGNS: ED Triage Vitals  Enc Vitals Group     BP 03/30/16 1443 (!) 143/81     Pulse Rate 03/30/16 1443 96     Resp 03/30/16 1443 20     Temp 03/30/16 1443 97.9 F (36.6 C)     Temp Source 03/30/16 1443 Oral     SpO2 03/30/16 1443 95 %     Weight 03/30/16 1444 117 lb (53.1 kg)     Height 03/30/16 1444 5\' 6"  (1.676 m)     Head Circumference --      Peak Flow --      Pain Score 03/30/16 1444 10     Pain Loc --      Pain Edu? --      Excl. in [place]? --     Constitutional: Alert and oriented. Well appearing But In mild pain at rest Eyes: Conjunctivae are normal. PERRL. EOMI. Head: Atraumatic. Nose: No congestion/rhinnorhea. Mouth/Throat: Mucous membranes are moist.  Oropharynx non-erythematous. Neck: No stridor.  No meningeal signs.  No cervical spine tenderness to palpation. Cardiovascular: Normal rate, regular rhythm. Good peripheral circulation. Grossly normal heart sounds. Respiratory: Normal respiratory effort.  No retractions. Lungs CTAB. Gastrointestinal: Soft and nontender. No distention.  Musculoskeletal: No gross deformity of the right lower extremity but severe tenderness to palpation and range of motion both of the right hip and the right knee both above and below the joint. Neurologic:  Normal speech and language. No gross focal neurologic deficits are appreciated.  Hard of appearing Skin:  Skin is warm, dry and intact. No rash noted. Psychiatric: Mood and affect are normal. Speech and behavior are normal.  ____________________________________________   LABS (all labs  ordered are listed, but only abnormal results are displayed)  Labs Reviewed  COMPREHENSIVE METABOLIC PANEL - Abnormal; Notable for the following:       Result Value   Glucose, Bld 172 (*)    All other components within normal limits  CBC WITH DIFFERENTIAL/PLATELET - Abnormal; Notable for the following:    HCT 34.9 (*)    RDW 15.7 (*)    All other components within normal limits  URINALYSIS COMPLETEWITH MICROSCOPIC ([facility] ONLY) - Abnormal; Notable for the following:    Color, Urine YELLOW (*)    APPearance CLEAR (*)    Bacteria, UA RARE (*)    Squamous Epithelial / LPF 0-5 (*)    All other components within normal limits  SURGICAL PCR SCREEN  PROTIME-INR  HEMOGLOBIN A1C  TYPE AND SCREEN   ____________________________________________  EKG  ED ECG REPORT I, Philis Doke, the attending physician, personally viewed and interpreted  this ECG.  Date: 03/30/2016 EKG Time: 15:05 Rate: 83 Rhythm: normal sinus rhythm QRS Axis: normal Intervals: normal ST/T Wave abnormalities: Non-specific ST segment / T-wave changes, but no evidence of acute ischemia. Conduction Disturbances: none Narrative Interpretation: unremarkable  ____________________________________________  RADIOLOGY   Dg Knee 2 Views Right  Result Date: 03/30/2016 CLINICAL DATA:  Fall.  Pain. EXAM: RIGHT KNEE - 1-2 VIEW COMPARISON:  None. FINDINGS: There is a lucency which extends through the lateral tibial plateau. No displaced fracture fragments are identified and there is no joint effusion. There is moderate medial compartment osteoarthritis identified. IMPRESSION: 1. Suspicious lucency involving the lateral tibial plateau. Cannot rule out fracture. Recommend further evaluation with CT of the right knee. Electronically Signed   By: Kerby Moors M.D.   On: 03/30/2016 16:15   Ct Knee Right Wo Contrast  Result Date: 03/30/2016 CLINICAL DATA:  82 y/o F; status post fall with right leg pain from lower leg the hip. Unable  to straighten right leg due to pain. EXAM: CT OF THE RIGHT KNEE WITHOUT CONTRAST TECHNIQUE: Multidetector CT imaging of the RIGHT knee was performed according to the standard protocol. Multiplanar CT image reconstructions were also generated. COMPARISON:  03/29/2016 right knee radiographs. FINDINGS: Bones/Joint/Cartilage No acute fracture is identified. Small low-attenuation joint effusion There is severe lateral femoral tibial compartment joint space narrowing with apposition of bony articular surfaces which demonstrated sclerotic changes. There is mild narrowing of the medial femoral tibial compartment with chondrocalcinosis of articular cartilage. There is mild narrowing of the patellofemoral compartment with minimal chondrocalcinosis of articular cartilage. There are small tricompartmental osteophytes. Bones are demineralized. Ligaments Suboptimally assessed by CT. Muscles and Tendons Diminished muscle bulk.  No hematoma. Soft tissues Extensive vascular calcifications. IMPRESSION: 1. No acute fracture is identified.  Small joint effusion. 2. Tricompartmental osteoarthrosis greatest in the lateral femoral tibial compartment where there is severe joint space narrowing and full thickness cartilage wear with apposition of bony articular surfaces with sclerotic changes. 3. Chondrocalcinosis. Electronically Signed   By: Kristine Garbe M.D.   On: 03/30/2016 18:40   Dg Chest Portable 1 View  Result Date: 03/30/2016 CLINICAL DATA:  Preoperative examination prior to right hip surgery for fracture. EXAM: PORTABLE CHEST 1 VIEW COMPARISON:  10/20/2015 FINDINGS: Mild cardiomegaly noted. There is no evidence of focal airspace disease, pulmonary edema, suspicious pulmonary nodule/mass, pleural effusion, or pneumothorax. No acute bony abnormalities are identified. IMPRESSION: Mild cardiomegaly without evidence of acute cardiopulmonary disease. Electronically Signed   By: Margarette Canada M.D.   On: 03/30/2016 16:25    Dg Hip Unilat W Or Wo Pelvis 2-3 Views Right  Result Date: 03/30/2016 CLINICAL DATA:  Distal RIGHT femoral pain, hip injury, history hypertension, diabetes mellitus, stroke EXAM: DG HIP (WITH OR WITHOUT PELVIS) 2-3V RIGHT COMPARISON:  LEFT pelvic radiograph 10/20/2015 FINDINGS: IM nail with compression screw proximal LEFT femur prostate healing intertrochanteric fracture. Marked osseous demineralization. Extensive deformity of the lateral aspect of the RIGHT iliac wing question irregular exostosis versus sequela of prior trauma. Nondisplaced intertrochanteric fracture RIGHT femur. Visualized pelvis intact. No additional fracture or dislocation identified. Extensive atherosclerotic calcifications. IMPRESSION: Nondisplaced intertrochanteric fracture RIGHT femur. Marked osseous demineralization. Post ORIF of an intertrochanteric fracture of the LEFT femur. Electronically Signed   By: Lavonia Dana M.D.   On: 03/30/2016 16:07    ____________________________________________   PROCEDURES  Procedure(s) performed:   Procedures   Critical Care performed: No ____________________________________________   INITIAL IMPRESSION / ASSESSMENT AND PLAN / ED COURSE  Pertinent labs & imaging results that were available during my care of the patient were reviewed by me and considered in my medical decision making (see chart for details).  Acute intertrochanteric femur fracture on the right, possible tibial plateau fracture.  CT scan nondiagnostic.  Hospitalist admitting.  I spoke by phone with Dr. Marry Guan who agreed with the plan and will put in orders anticipating taking the patient to the operating room.   ____________________________________________  FINAL CLINICAL IMPRESSION(S) / ED DIAGNOSES  Final diagnoses:  Intertrochanteric fracture of right femur, closed, initial encounter ([place])     MEDICATIONS GIVEN DURING THIS VISIT:  Medications  ceFAZolin (ANCEF) IVPB 2g/100 mL premix (not  administered)  0.9 %  sodium chloride infusion ( Intravenous New Bag/Given 03/30/16 1834)  acetaminophen (TYLENOL) tablet 650 mg (not administered)    Or  acetaminophen (TYLENOL) suppository 650 mg (not administered)  oxyCODONE (Oxy IR/ROXICODONE) immediate release tablet 5 mg (not administered)  ondansetron (ZOFRAN) tablet 4 mg (not administered)    Or  ondansetron (ZOFRAN) injection 4 mg (not administered)  docusate sodium (COLACE) capsule 100 mg (not administered)  polyethylene glycol (MIRALAX / GLYCOLAX) packet 17 g (not administered)  morphine 2 MG/ML injection 2 mg (2 mg Intravenous Given 03/30/16 1839)  baclofen (LIORESAL) tablet 10 mg (not administered)  senna (SENOKOT) tablet 17.2 mg (not administered)  atorvastatin (LIPITOR) tablet 10 mg (not administered)  primidone (MYSOLINE) tablet 50 mg (not administered)  cholecalciferol (VITAMIN D) tablet 1,000 Units (not administered)  DULoxetine (CYMBALTA) DR capsule 60 mg (not administered)  gabapentin (NEURONTIN) tablet 600 mg (600 mg Oral Not Given 03/30/16 1835)  propranolol (INDERAL) tablet 80 mg (not administered)  traZODone (DESYREL) tablet 100-200 mg (not administered)  insulin aspart (novoLOG) injection 0-9 Units (not administered)  insulin aspart (novoLOG) injection 0-5 Units (not administered)  morphine 2 MG/ML injection 2 mg (2 mg Intravenous Given 03/30/16 1632)     NEW OUTPATIENT MEDICATIONS STARTED DURING THIS VISIT:  Current Discharge Medication List        Note:  This document was prepared using Dragon voice recognition software and may include unintentional dictation errors.    Hinda Kehr, MD 03/30/16 1902

## 2016-03-30 NOTE — H&P (Signed)
[place] [facility] [place] NAME: Carrie Bishop    MR#:  [id number]  DATE OF BIRTH:  11-Nov-1928   DATE OF ADMISSION:  03/30/2016  PRIMARY CARE PHYSICIAN: Cyndi Bender, PA-C   REQUESTING/REFERRING PHYSICIAN: Karma Greaser  CHIEF COMPLAINT:   Chief Complaint  Patient presents with  . Leg Pain    HISTORY OF PRESENT ILLNESS:  Carrie Bishop  is a 82 y.o. female with a known history of Osteoporosis, type 2 diabetes not requiring who is presenting after mechanical fall. She was using her walker at [facility] and fell. Suffering immediate pain in right hip described only as "pain" intensity 10/10 worsened with movement, no relieving factors nonradiating found to have right-sided intertrochanteric fracture  PAST MEDICAL HISTORY:   Past Medical History:  Diagnosis Date  . Depression   . Diabetes mellitus without complication ([place])   . Hip fracture [facility])    left March 2017  . Hypertension   . Neuropathy ([place])   . Osteoporosis   . Restless leg syndrome   . Stroke ([place])   . Tremor     PAST SURGICAL HISTORY:   Past Surgical History:  Procedure Laterality Date  . ABDOMINAL HYSTERECTOMY    . arm surgery    . HIP FRACTURE SURGERY Left 09/2015  . INTRAMEDULLARY (IM) NAIL INTERTROCHANTERIC Left 10/21/2015   Procedure: INTRAMEDULLARY (IM) NAIL INTERTROCHANTRIC LEFT LONG AFFIXUS;  Surgeon: Hessie Knows, MD;  Location: [facility] ORS;  Service: Orthopedics;  Laterality: Left;  . LEG SURGERY      SOCIAL HISTORY:   Social History  Substance Use Topics  . Smoking status: Never Smoker  . Smokeless tobacco: Never Used  . Alcohol use No    FAMILY HISTORY:   Family History  Problem Relation Age of Onset  . Hypertension Other     DRUG ALLERGIES:  No Known Allergies  REVIEW OF SYSTEMS:  REVIEW OF SYSTEMS:  CONSTITUTIONAL: Denies fevers, chills, fatigue, weakness.  EYES: Denies blurred vision, double vision, or eye pain.  EARS, NOSE, THROAT: Denies  tinnitus, ear pain, hearing loss.  RESPIRATORY: denies cough, shortness of breath, wheezing  CARDIOVASCULAR: Denies chest pain, palpitations, edema.  GASTROINTESTINAL: Denies nausea, vomiting, diarrhea, abdominal pain.  GENITOURINARY: Denies dysuria, hematuria.  ENDOCRINE: Denies nocturia or thyroid problems. HEMATOLOGIC AND LYMPHATIC: Denies easy bruising or bleeding.  SKIN: Denies rash or lesions.  MUSCULOSKELETAL: Denies pain in neck, back, shoulder, knees, , or further arthritic symptoms. Positive right-sided hip pain  NEUROLOGIC: Denies paralysis, paresthesias.  PSYCHIATRIC: Denies anxiety or depressive symptoms. Otherwise full review of systems performed by me is negative.   MEDICATIONS AT HOME:   Prior to Admission medications   Medication Sig Start Date End Date Taking? Authorizing Provider  alendronate (FOSAMAX) 70 MG tablet Take 70 mg by mouth once a week. Take with a full glass of water on an empty stomach. On Fridays.    Historical Provider, MD  atorvastatin (LIPITOR) 10 MG tablet Take 10 mg by mouth at bedtime.     Historical Provider, MD  baclofen (LIORESAL) 10 MG tablet Take 10 mg by mouth every 8 (eight) hours as needed for muscle spasms.    Historical Provider, MD  cholecalciferol (VITAMIN D) 1000 units tablet Take 1,000 Units by mouth daily.    Historical Provider, MD  DULoxetine (CYMBALTA) 60 MG capsule Take 60 mg by mouth daily.     Historical Provider, MD  enoxaparin (LOVENOX) 40 MG/0.4ML injection Inject 0.4 mLs (40 mg total) into  the skin daily. 10/23/15   Reche Dixon, PA-C  gabapentin (NEURONTIN) 600 MG tablet Take 600 mg by mouth 3 (three) times daily.    Historical Provider, MD  HYDROcodone-acetaminophen (NORCO/VICODIN) 5-325 MG tablet Take 1-2 tablets by mouth every 4 (four) hours as needed for moderate pain. 11/07/15   Tiffany L Reed, DO  Insulin Glargine (LANTUS SOLOSTAR) 100 UNIT/ML Solostar Pen Inject 5 Units into the skin daily at 10 pm. 11/01/15   Dinah C  Ngetich, NP  lisinopril (PRINIVIL,ZESTRIL) 40 MG tablet Take 1 tablet by mouth at bedtime.  09/26/15   Historical Provider, MD  metFORMIN (GLUCOPHAGE) 500 MG tablet Take 1,500 mg by mouth at bedtime.    Historical Provider, MD  polyethylene glycol (MIRALAX / GLYCOLAX) packet Take 17 g by mouth daily as needed.     Historical Provider, MD  primidone (MYSOLINE) 50 MG tablet Take 50 mg by mouth at bedtime.     Historical Provider, MD  propranolol (INDERAL) 80 MG tablet Take 80 mg by mouth at bedtime.    Historical Provider, MD  senna (SENOKOT) 8.6 MG TABS tablet Take 2 tablets by mouth at bedtime.    Historical Provider, MD  traZODone (DESYREL) 100 MG tablet Take 100-200 mg by mouth at bedtime as needed for sleep.    Historical Provider, MD      VITAL SIGNS:  Blood pressure (!) 143/81, pulse 96, temperature 97.9 F (36.6 C), temperature source Oral, resp. rate 20, height 5\' 6"  (1.676 m), weight 53.1 kg (117 lb), SpO2 95 %.  PHYSICAL EXAMINATION:  VITAL SIGNS: Vitals:   03/30/16 1443  BP: (!) 143/81  Pulse: 96  Resp: 20  Temp: 97.9 F (36.6 C)   GENERAL:82 y.o.female currently in no acute distress.  HEAD: Normocephalic, atraumatic.  EYES: Pupils equal, round, reactive to light. Extraocular muscles intact. No scleral icterus.  MOUTH: Moist mucosal membrane. Dentition intact. No abscess noted.  EAR, NOSE, THROAT: Clear without exudates. No external lesions.  NECK: Supple. No thyromegaly. No nodules. No JVD.  PULMONARY: Clear to ascultation, without wheeze rails or rhonci. No use of accessory muscles, Good respiratory effort. good air entry bilaterally CHEST: Nontender to palpation.  CARDIOVASCULAR: S1 and S2. Regular rate and rhythm. No murmurs, rubs, or gallops. No edema. Pedal pulses 2+ bilaterally.  GASTROINTESTINAL: Soft, nontender, nondistended. No masses. Positive bowel sounds. No hepatosplenomegaly.  MUSCULOSKELETAL: No swelling, clubbing, or edema. Range of motionLimited in right  lower extremity given pain  NEUROLOGIC: Cranial nerves II through XII are intact. No gross focal neurological deficits. Sensation intact. Reflexes intact.  SKIN: No ulceration, lesions, rashes, or cyanosis. Skin warm and dry. Turgor intact.  PSYCHIATRIC: Mood, affect within normal limits. The patient is awake, alert and oriented x 3. Insight, judgment intact.    LABORATORY PANEL:   CBC No results for input(s): WBC, HGB, HCT, PLT in the last 168 hours. ------------------------------------------------------------------------------------------------------------------  Chemistries  No results for input(s): NA, K, CL, CO2, GLUCOSE, BUN, CREATININE, CALCIUM, MG, AST, ALT, ALKPHOS, BILITOT in the last 168 hours.  Invalid input(s): GFRCGP ------------------------------------------------------------------------------------------------------------------  Cardiac Enzymes No results for input(s): TROPONINI in the last 168 hours. ------------------------------------------------------------------------------------------------------------------  RADIOLOGY:  Dg Knee 2 Views Right  Result Date: 03/30/2016 CLINICAL DATA:  Fall.  Pain. EXAM: RIGHT KNEE - 1-2 VIEW COMPARISON:  None. FINDINGS: There is a lucency which extends through the lateral tibial plateau. No displaced fracture fragments are identified and there is no joint effusion. There is moderate medial compartment osteoarthritis identified. IMPRESSION: 1.  Suspicious lucency involving the lateral tibial plateau. Cannot rule out fracture. Recommend further evaluation with CT of the right knee. Electronically Signed   By: Kerby Moors M.D.   On: 03/30/2016 16:15   Dg Chest Portable 1 View  Result Date: 03/30/2016 CLINICAL DATA:  Preoperative examination prior to right hip surgery for fracture. EXAM: PORTABLE CHEST 1 VIEW COMPARISON:  10/20/2015 FINDINGS: Mild cardiomegaly noted. There is no evidence of focal airspace disease, pulmonary edema,  suspicious pulmonary nodule/mass, pleural effusion, or pneumothorax. No acute bony abnormalities are identified. IMPRESSION: Mild cardiomegaly without evidence of acute cardiopulmonary disease. Electronically Signed   By: Margarette Canada M.D.   On: 03/30/2016 16:25   Dg Hip Unilat W Or Wo Pelvis 2-3 Views Right  Result Date: 03/30/2016 CLINICAL DATA:  Distal RIGHT femoral pain, hip injury, history hypertension, diabetes mellitus, stroke EXAM: DG HIP (WITH OR WITHOUT PELVIS) 2-3V RIGHT COMPARISON:  LEFT pelvic radiograph 10/20/2015 FINDINGS: IM nail with compression screw proximal LEFT femur prostate healing intertrochanteric fracture. Marked osseous demineralization. Extensive deformity of the lateral aspect of the RIGHT iliac wing question irregular exostosis versus sequela of prior trauma. Nondisplaced intertrochanteric fracture RIGHT femur. Visualized pelvis intact. No additional fracture or dislocation identified. Extensive atherosclerotic calcifications. IMPRESSION: Nondisplaced intertrochanteric fracture RIGHT femur. Marked osseous demineralization. Post ORIF of an intertrochanteric fracture of the LEFT femur. Electronically Signed   By: Lavonia Dana M.D.   On: 03/30/2016 16:07    EKG:   Orders placed or performed during the hospital encounter of 10/20/15  . ED EKG  . ED EKG  . EKG 12-Lead  . EKG 12-Lead    IMPRESSION AND PLAN:   82 year old Caucasian female history of osteoporosis presenting after mechanical fall  1. Preoperative examination/right intertrochanteric fracture: Patient should be considered moderate risk for moderate risk surgery from cardiac standpoint requires no further intervention or testing prior to surgery. As far as medications are concerned hold lisinopril in the preoperative period - can be restarted postoperatively, she is no longer taking insulin as a home medication  Pain control/bowel regiment  2. Type 2 diabetes non-insulin-requiring: Hold oral agents add a  sliding coverage 3. Essential hypertension: Hold lisinopril 4. Essential tremor: Propranolol 5. Venous thrombi embolism prophylactic: SCDs   All the records are reviewed and case discussed with ED provider. Management plans discussed with the patient, family and they are in agreement.  CODE STATUS: Full  TOTAL TIME TAKING CARE OF THIS PATIENT: 35  minutes.    Hower,  Karenann Cai.D on 03/30/2016 at 5:09 PM  Between 7am to 6pm - Pager - [phone]  After 6pm: House Pager: - [phone]  [place] [facility]  Office  [phone]  CC: Primary care physician; Cyndi Bender, PA-C

## 2016-03-30 NOTE — ED Notes (Signed)
Patient transported to X-ray 

## 2016-03-30 NOTE — ED Notes (Signed)
Pt reports she has a blood clot in her R leg, states no interventions have been done yet

## 2016-03-30 NOTE — ED Triage Notes (Signed)
Patient states she fell at [facility] today from walker. Patient c/o pain in right leg from lower leg to hip. Patient is unable to straighten right leg due to pain. Patient recently fracture left hip in March 2017.

## 2016-03-30 NOTE — Consult Note (Signed)
ORTHOPAEDIC CONSULTATION  PATIENT NAME: ASHLIEGH FACEY DOB: 01/10/1929  MRN: [id number]  REQUESTING PHYSICIAN: Demetrios Loll, MD  Chief Complaint: Right hip pain  HPI: TRUC SEETON is a 82 y.o. female who complains of  right hip pain. The patient sustained a mechanical fall while at [facility]. She believes she forgot to set the brakes on her walker and it gave way when she leaned against it. She apparently fell on her right hip and side and was unable stand or bear weight due to the right hip pain. She denied any loss of consciousness. She denied any other injuries.  Past Medical History:  Diagnosis Date  . Depression   . Diabetes mellitus without complication ([place])   . Hip fracture [facility])    left March 2017  . Hypertension   . Neuropathy ([place])   . Osteoporosis   . Restless leg syndrome   . Stroke ([place])   . Tremor    Past Surgical History:  Procedure Laterality Date  . ABDOMINAL HYSTERECTOMY    . arm surgery    . INTRAMEDULLARY (IM) NAIL INTERTROCHANTERIC Left 10/21/2015   Procedure: INTRAMEDULLARY (IM) NAIL INTERTROCHANTRIC LEFT LONG AFFIXUS;  Surgeon: Hessie Knows, MD;  Location: [facility] ORS;  Service: Orthopedics;  Laterality: Left;  . LEG SURGERY     Social History   Social History  . Marital status: Widowed    Spouse name: N/A  . Number of children: N/A  . Years of education: N/A   Social History Main Topics  . Smoking status: Never Smoker  . Smokeless tobacco: Never Used  . Alcohol use No  . Drug use: No  . Sexual activity: Not Asked   Other Topics Concern  . None   Social History Narrative  . None   Family History  Problem Relation Age of Onset  . Hypertension Other    No Known Allergies Prior to Admission medications   Medication Sig Start Date End Date Taking? Authorizing Provider  cholecalciferol (VITAMIN D) 1000 units tablet Take 1,000 Units by mouth daily.   Yes Historical Provider, MD  DULoxetine (CYMBALTA) 60 MG capsule Take 60 mg by mouth daily.     Yes Historical Provider, MD  gabapentin (NEURONTIN) 600 MG tablet Take 600 mg by mouth 3 (three) times daily.   Yes Historical Provider, MD  lisinopril (PRINIVIL,ZESTRIL) 40 MG tablet Take 1 tablet by mouth at bedtime.  09/26/15  Yes Historical Provider, MD  metFORMIN (GLUCOPHAGE) 500 MG tablet Take 1,500 mg by mouth at bedtime.   Yes Historical Provider, MD  pravastatin (PRAVACHOL) 40 MG tablet Take 40 mg by mouth daily.   Yes Historical Provider, MD  primidone (MYSOLINE) 50 MG tablet Take 100 mg by mouth at bedtime.    Yes Historical Provider, MD  traMADol (ULTRAM) 50 MG tablet Take 100 mg by mouth at bedtime.   Yes Historical Provider, MD  traZODone (DESYREL) 100 MG tablet Take 100-200 mg by mouth at bedtime as needed for sleep.   Yes Historical Provider, MD  alendronate (FOSAMAX) 70 MG tablet Take 70 mg by mouth once a week. Take with a full glass of water on an empty stomach. On Fridays.    Historical Provider, MD   Dg Knee 2 Views Right  Result Date: 03/30/2016 CLINICAL DATA:  Fall.  Pain. EXAM: RIGHT KNEE - 1-2 VIEW COMPARISON:  None. FINDINGS: There is a lucency which extends through the lateral tibial plateau. No displaced fracture fragments are identified and there is no joint effusion.  There is moderate medial compartment osteoarthritis identified. IMPRESSION: 1. Suspicious lucency involving the lateral tibial plateau. Cannot rule out fracture. Recommend further evaluation with CT of the right knee. Electronically Signed   By: Kerby Moors M.D.   On: 03/30/2016 16:15   Ct Knee Right Wo Contrast  Result Date: 03/30/2016 CLINICAL DATA:  82 y/o F; status post fall with right leg pain from lower leg the hip. Unable to straighten right leg due to pain. EXAM: CT OF THE RIGHT KNEE WITHOUT CONTRAST TECHNIQUE: Multidetector CT imaging of the RIGHT knee was performed according to the standard protocol. Multiplanar CT image reconstructions were also generated. COMPARISON:  03/29/2016 right knee  radiographs. FINDINGS: Bones/Joint/Cartilage No acute fracture is identified. Small low-attenuation joint effusion There is severe lateral femoral tibial compartment joint space narrowing with apposition of bony articular surfaces which demonstrated sclerotic changes. There is mild narrowing of the medial femoral tibial compartment with chondrocalcinosis of articular cartilage. There is mild narrowing of the patellofemoral compartment with minimal chondrocalcinosis of articular cartilage. There are small tricompartmental osteophytes. Bones are demineralized. Ligaments Suboptimally assessed by CT. Muscles and Tendons Diminished muscle bulk.  No hematoma. Soft tissues Extensive vascular calcifications. IMPRESSION: 1. No acute fracture is identified.  Small joint effusion. 2. Tricompartmental osteoarthrosis greatest in the lateral femoral tibial compartment where there is severe joint space narrowing and full thickness cartilage wear with apposition of bony articular surfaces with sclerotic changes. 3. Chondrocalcinosis. Electronically Signed   By: Kristine Garbe M.D.   On: 03/30/2016 18:40   Dg Chest Portable 1 View  Result Date: 03/30/2016 CLINICAL DATA:  Preoperative examination prior to right hip surgery for fracture. EXAM: PORTABLE CHEST 1 VIEW COMPARISON:  10/20/2015 FINDINGS: Mild cardiomegaly noted. There is no evidence of focal airspace disease, pulmonary edema, suspicious pulmonary nodule/mass, pleural effusion, or pneumothorax. No acute bony abnormalities are identified. IMPRESSION: Mild cardiomegaly without evidence of acute cardiopulmonary disease. Electronically Signed   By: Margarette Canada M.D.   On: 03/30/2016 16:25   Dg Hip Unilat W Or Wo Pelvis 2-3 Views Right  Result Date: 03/30/2016 CLINICAL DATA:  Distal RIGHT femoral pain, hip injury, history hypertension, diabetes mellitus, stroke EXAM: DG HIP (WITH OR WITHOUT PELVIS) 2-3V RIGHT COMPARISON:  LEFT pelvic radiograph 10/20/2015 FINDINGS:  IM nail with compression screw proximal LEFT femur prostate healing intertrochanteric fracture. Marked osseous demineralization. Extensive deformity of the lateral aspect of the RIGHT iliac wing question irregular exostosis versus sequela of prior trauma. Nondisplaced intertrochanteric fracture RIGHT femur. Visualized pelvis intact. No additional fracture or dislocation identified. Extensive atherosclerotic calcifications. IMPRESSION: Nondisplaced intertrochanteric fracture RIGHT femur. Marked osseous demineralization. Post ORIF of an intertrochanteric fracture of the LEFT femur. Electronically Signed   By: Lavonia Dana M.D.   On: 03/30/2016 16:07    Positive ROS: All other systems have been reviewed and were otherwise negative with the exception of those mentioned in the HPI and as above.  Physical Exam: General: Alert and alert in no acute distress. HEENT: Atraumatic and normocephalic. Sclera are clear. Extraocular motion is intact. Oropharynx is clear with moist mucosa. Neck: Supple, nontender, good range of motion. No JVD or carotid bruits. Lungs: Clear to auscultation bilaterally. Cardiovascular: Regular rate and rhythm with normal S1 and S2. 2/6 soft murmur. No gallops or rubs. Pedal pulses are palpable bilaterally. Homans test is negative bilaterally. No significant pretibial or ankle edema. Abdomen: Soft, nontender, and nondistended. Bowel sounds are present. Skin: No lesions in the area of chief complaint. Healing abrasions are  noted to the left forearm. Neurologic: Awake, alert, and oriented. Sensory function is grossly intact. Motor strength is felt to be 5 over 5 bilaterally. No clonus or tremor. Good motor coordination. Lymphatic: No axillary or cervical lymphadenopathy  MUSCULOSKELETAL: Musculoskeletal exam is significant for findings involving the right lower extremity. Pain is elicited with attempted range of motion of the right hip. There is no gross shortening. Bucks traction is in  place. There is no gross tenderness to palpation about the lateral right knee. No knee effusion or gross soft tissue swelling about the knee.  Well-healed surgical incisions are noted to the left hip consistent with the previous surgery. Gentle range of motion of the left hip and knee are well-tolerated. Good range of motion and stability of the shoulders, elbows, wrists are appreciated.  Assessment: Right intertrochanteric femur fracture No appreciable fracture about the right knee as confirmed by CT scan  Plan: Notes from Medicine were reviewed. Labs were reviewed. The findings were discussed in detail with the patient and her daughter. I recommended open reduction and internal fixation of the right intertrochanteric femur fracture. The usual perioperative course was discussed. The risks and benefits of surgical intervention were reviewed. The patient expressed understanding of the risks and benefits and agreed with plans for surgical intervention.   The surgical site was signed as per the "right site surgery" protocol.   Marisah Laker P. Holley Bouche M.D.

## 2016-03-31 ENCOUNTER — Inpatient Hospital Stay: Payer: Commercial Managed Care - HMO | Admitting: Registered Nurse

## 2016-03-31 ENCOUNTER — Inpatient Hospital Stay: Payer: Commercial Managed Care - HMO

## 2016-03-31 ENCOUNTER — Encounter: Payer: Self-pay | Admitting: Anesthesiology

## 2016-03-31 ENCOUNTER — Encounter
Admission: EM | Disposition: A | Discharge status: Skilled Nursing Facility | Payer: Self-pay | Source: Home / Self Care | Attending: Internal Medicine

## 2016-03-31 HISTORY — PX: INTRAMEDULLARY (IM) NAIL INTERTROCHANTERIC: SHX5875

## 2016-03-31 LAB — GLUCOSE, CAPILLARY
GLUCOSE-CAPILLARY: 145 mg/dL — AB (ref 65–99)
Glucose-Capillary: 151 mg/dL — ABNORMAL HIGH (ref 65–99)
Glucose-Capillary: 180 mg/dL — ABNORMAL HIGH (ref 65–99)
Glucose-Capillary: 190 mg/dL — ABNORMAL HIGH (ref 65–99)

## 2016-03-31 LAB — HEMOGLOBIN A1C: HEMOGLOBIN A1C: 5.9 % (ref 4.0–6.0)

## 2016-03-31 SURGERY — FIXATION, FRACTURE, INTERTROCHANTERIC, WITH INTRAMEDULLARY ROD
Anesthesia: General | Laterality: Right

## 2016-03-31 MED ORDER — BISACODYL 10 MG RE SUPP
10.0000 mg | Freq: Every day | RECTAL | Status: DC | PRN
  Administered 2016-04-03: 10 mg via RECTAL
  Filled 2016-03-31: qty 1

## 2016-03-31 MED ORDER — LACTATED RINGERS IV SOLN
INTRAVENOUS | Status: DC | PRN
  Administered 2016-03-31: 08:00:00 via INTRAVENOUS

## 2016-03-31 MED ORDER — MENTHOL 3 MG MT LOZG
1.0000 | LOZENGE | OROMUCOSAL | Status: DC | PRN
  Filled 2016-03-31: qty 9

## 2016-03-31 MED ORDER — NEOMYCIN-POLYMYXIN B GU 40-200000 IR SOLN
Status: AC
  Filled 2016-03-31: qty 4

## 2016-03-31 MED ORDER — FLEET ENEMA 7-19 GM/118ML RE ENEM: 1.0000 | ENEMA | Freq: Once | RECTAL | Status: DC | PRN

## 2016-03-31 MED ORDER — MAGNESIUM HYDROXIDE 400 MG/5ML PO SUSP
30.0000 mL | Freq: Every day | ORAL | Status: DC | PRN
  Administered 2016-04-01 – 2016-04-02 (×2): 30 mL via ORAL
  Filled 2016-03-31 (×2): qty 30

## 2016-03-31 MED ORDER — FENTANYL CITRATE (PF) 100 MCG/2ML IJ SOLN
INTRAMUSCULAR | Status: DC | PRN
  Administered 2016-03-31 (×2): 50 ug via INTRAVENOUS

## 2016-03-31 MED ORDER — ACETAMINOPHEN 10 MG/ML IV SOLN
1000.0000 mg | Freq: Four times a day (QID) | INTRAVENOUS | Status: AC
  Administered 2016-03-31 – 2016-04-01 (×4): 1000 mg via INTRAVENOUS
  Filled 2016-03-31 (×4): qty 100

## 2016-03-31 MED ORDER — PANTOPRAZOLE SODIUM 40 MG PO TBEC
40.0000 mg | DELAYED_RELEASE_TABLET | Freq: Two times a day (BID) | ORAL | Status: DC
  Administered 2016-03-31 – 2016-04-03 (×6): 40 mg via ORAL
  Filled 2016-03-31 (×7): qty 1

## 2016-03-31 MED ORDER — ENOXAPARIN SODIUM 40 MG/0.4ML ~~LOC~~ SOLN
40.0000 mg | SUBCUTANEOUS | Status: DC
Start: 2016-04-01 — End: 2016-04-03
  Administered 2016-04-01 – 2016-04-03 (×3): 40 mg via SUBCUTANEOUS
  Filled 2016-03-31 (×3): qty 0.4

## 2016-03-31 MED ORDER — ACETAMINOPHEN 650 MG RE SUPP: 650.0000 mg | Freq: Four times a day (QID) | RECTAL | Status: DC | PRN

## 2016-03-31 MED ORDER — ONDANSETRON HCL 4 MG/2ML IJ SOLN
4.0000 mg | Freq: Four times a day (QID) | INTRAMUSCULAR | Status: DC | PRN
  Filled 2016-03-31: qty 2

## 2016-03-31 MED ORDER — ACETAMINOPHEN 10 MG/ML IV SOLN
INTRAVENOUS | Status: DC | PRN
  Administered 2016-03-31: 1000 mg via INTRAVENOUS

## 2016-03-31 MED ORDER — GLYCOPYRROLATE 0.2 MG/ML IJ SOLN
INTRAMUSCULAR | Status: DC | PRN
  Administered 2016-03-31: 0.4 mg via INTRAVENOUS

## 2016-03-31 MED ORDER — METOCLOPRAMIDE HCL 10 MG PO TABS
10.0000 mg | ORAL_TABLET | Freq: Three times a day (TID) | ORAL | Status: DC
  Administered 2016-03-31 – 2016-04-02 (×7): 10 mg via ORAL
  Filled 2016-03-31 (×6): qty 1

## 2016-03-31 MED ORDER — ROCURONIUM BROMIDE 100 MG/10ML IV SOLN
INTRAVENOUS | Status: DC | PRN
  Administered 2016-03-31: 30 mg via INTRAVENOUS

## 2016-03-31 MED ORDER — SUCCINYLCHOLINE CHLORIDE 20 MG/ML IJ SOLN
INTRAMUSCULAR | Status: DC | PRN
  Administered 2016-03-31: 100 mg via INTRAVENOUS

## 2016-03-31 MED ORDER — NEOMYCIN-POLYMYXIN B GU 40-200000 IR SOLN
Status: DC | PRN
  Administered 2016-03-31: 4 mL

## 2016-03-31 MED ORDER — CEFAZOLIN SODIUM-DEXTROSE 2-4 GM/100ML-% IV SOLN
2.0000 g | Freq: Four times a day (QID) | INTRAVENOUS | Status: AC
  Administered 2016-03-31 – 2016-04-01 (×4): 2 g via INTRAVENOUS
  Filled 2016-03-31 (×4): qty 100

## 2016-03-31 MED ORDER — TRAMADOL HCL 50 MG PO TABS
50.0000 mg | ORAL_TABLET | ORAL | Status: DC | PRN
  Administered 2016-04-01 – 2016-04-02 (×2): 50 mg via ORAL
  Filled 2016-03-31 (×2): qty 1

## 2016-03-31 MED ORDER — METOCLOPRAMIDE HCL 5 MG/ML IJ SOLN: 5.0000 mg | Freq: Three times a day (TID) | INTRAMUSCULAR | Status: DC | PRN

## 2016-03-31 MED ORDER — FERROUS SULFATE 325 (65 FE) MG PO TABS
325.0000 mg | ORAL_TABLET | Freq: Two times a day (BID) | ORAL | Status: DC
  Administered 2016-03-31 – 2016-04-03 (×6): 325 mg via ORAL
  Filled 2016-03-31 (×6): qty 1

## 2016-03-31 MED ORDER — METOCLOPRAMIDE HCL 10 MG PO TABS
5.0000 mg | ORAL_TABLET | Freq: Three times a day (TID) | ORAL | Status: DC | PRN
  Filled 2016-03-31: qty 1

## 2016-03-31 MED ORDER — ONDANSETRON HCL 4 MG/2ML IJ SOLN: 4.0000 mg | Freq: Once | INTRAMUSCULAR | Status: DC | PRN

## 2016-03-31 MED ORDER — ACETAMINOPHEN 325 MG PO TABS
650.0000 mg | ORAL_TABLET | Freq: Four times a day (QID) | ORAL | Status: DC | PRN
  Administered 2016-03-31: 650 mg via ORAL

## 2016-03-31 MED ORDER — ACETAMINOPHEN 10 MG/ML IV SOLN
INTRAVENOUS | Status: AC
  Filled 2016-03-31: qty 100

## 2016-03-31 MED ORDER — ONDANSETRON HCL 4 MG/2ML IJ SOLN
INTRAMUSCULAR | Status: DC | PRN
  Administered 2016-03-31: 4 mg via INTRAVENOUS

## 2016-03-31 MED ORDER — ONDANSETRON HCL 4 MG PO TABS: 4.0000 mg | ORAL_TABLET | Freq: Four times a day (QID) | ORAL | Status: DC | PRN

## 2016-03-31 MED ORDER — OXYCODONE HCL 5 MG PO TABS
5.0000 mg | ORAL_TABLET | ORAL | Status: DC | PRN
  Administered 2016-03-31 (×4): 5 mg via ORAL
  Administered 2016-04-01 (×2): 10 mg via ORAL
  Administered 2016-04-01 – 2016-04-02 (×2): 5 mg via ORAL
  Administered 2016-04-02: 10 mg via ORAL
  Administered 2016-04-02 – 2016-04-03 (×3): 5 mg via ORAL
  Filled 2016-03-31 (×2): qty 1
  Filled 2016-03-31 (×2): qty 2
  Filled 2016-03-31: qty 1
  Filled 2016-03-31: qty 3
  Filled 2016-03-31: qty 2
  Filled 2016-03-31 (×4): qty 1
  Filled 2016-03-31: qty 2

## 2016-03-31 MED ORDER — MORPHINE SULFATE (PF) 2 MG/ML IV SOLN
2.0000 mg | INTRAVENOUS | Status: DC | PRN
  Administered 2016-03-31: 2 mg via INTRAVENOUS
  Filled 2016-03-31: qty 1

## 2016-03-31 MED ORDER — PHENOL 1.4 % MT LIQD
1.0000 | OROMUCOSAL | Status: DC | PRN
Start: 2016-03-31 — End: 2016-04-03
  Filled 2016-03-31: qty 177

## 2016-03-31 MED ORDER — NEOSTIGMINE METHYLSULFATE 10 MG/10ML IV SOLN
INTRAVENOUS | Status: DC | PRN
  Administered 2016-03-31: 2 mg via INTRAVENOUS

## 2016-03-31 MED ORDER — FENTANYL CITRATE (PF) 100 MCG/2ML IJ SOLN: 25.0000 ug | INTRAMUSCULAR | Status: DC | PRN

## 2016-03-31 MED ORDER — SODIUM CHLORIDE 0.9 % IV SOLN
INTRAVENOUS | Status: DC
  Administered 2016-03-31 (×2): via INTRAVENOUS

## 2016-03-31 MED ORDER — SENNOSIDES-DOCUSATE SODIUM 8.6-50 MG PO TABS
1.0000 | ORAL_TABLET | Freq: Two times a day (BID) | ORAL | Status: DC
  Administered 2016-03-31 – 2016-04-03 (×6): 1 via ORAL
  Filled 2016-03-31 (×6): qty 1

## 2016-03-31 MED ORDER — PROPOFOL 10 MG/ML IV BOLUS
INTRAVENOUS | Status: DC | PRN
  Administered 2016-03-31: 100 mg via INTRAVENOUS

## 2016-03-31 SURGICAL SUPPLY — 38 items
BIT DRILL SPINE 4.0MMX260 (BIT) ×1
BIT DRILL SPINE 4.0X260 (BIT) ×2 IMPLANT
BLADE TI HELICAL 11.0 95 (Orthopedic Implant) ×2 IMPLANT
BLADE TI HELICAL 11.0MM 95MM (Orthopedic Implant) ×1 IMPLANT
CANISTER SUCT 1200ML W/VALVE (MISCELLANEOUS) ×3 IMPLANT
DRAPE C-ARMOR (DRAPES) IMPLANT
DRAPE SHEET LG 3/4 BI-LAMINATE (DRAPES) ×3 IMPLANT
DRAPE TABLE BACK 80X90 (DRAPES) ×3 IMPLANT
DRSG DERMACEA 8X12 NADH (GAUZE/BANDAGES/DRESSINGS) ×3 IMPLANT
DRSG OPSITE POSTOP 3X4 (GAUZE/BANDAGES/DRESSINGS) ×3 IMPLANT
DRSG OPSITE POSTOP 4X12 (GAUZE/BANDAGES/DRESSINGS) IMPLANT
DURAPREP 26ML APPLICATOR (WOUND CARE) ×3 IMPLANT
ELECT REM PT RETURN 9FT ADLT (ELECTROSURGICAL) ×3
ELECTRODE REM PT RTRN 9FT ADLT (ELECTROSURGICAL) ×1 IMPLANT
GAUZE SPONGE 4X4 12PLY STRL (GAUZE/BANDAGES/DRESSINGS) IMPLANT
GLOVE BIO SURGEON STRL SZ7.5 (GLOVE) ×3 IMPLANT
GLOVE BIO SURGEON STRL SZ8 (GLOVE) ×9 IMPLANT
GLOVE BIOGEL M STRL SZ7.5 (GLOVE) ×3 IMPLANT
GLOVE INDICATOR 8.0 STRL GRN (GLOVE) ×3 IMPLANT
GOWN STRL REUS W/ TWL LRG LVL3 (GOWN DISPOSABLE) ×2 IMPLANT
GOWN STRL REUS W/TWL LRG LVL3 (GOWN DISPOSABLE) ×6
KIT RM TURNOVER CYSTO AR (KITS) ×3 IMPLANT
MAT BLUE FLOOR 46X72 FLO (MISCELLANEOUS) ×3 IMPLANT
NAIL TROCH FX 11/130D 170-S (Nail) ×3 IMPLANT
NS IRRIG 1000ML POUR BTL (IV SOLUTION) ×3 IMPLANT
PACK HIP COMPR (MISCELLANEOUS) ×3 IMPLANT
REAMER ROD DEEP FLUTE 2.5X950 (INSTRUMENTS) IMPLANT
SCREW LOCK TI 5.0X36 F/IM NAIL (Screw) ×3 IMPLANT
SOL PREP PVP 2OZ (MISCELLANEOUS) ×3
SOLUTION PREP PVP 2OZ (MISCELLANEOUS) ×1 IMPLANT
STAPLER SKIN PROX 35W (STAPLE) ×3 IMPLANT
SUCTION FRAZIER HANDLE 10FR (MISCELLANEOUS) ×2
SUCTION TUBE FRAZIER 10FR DISP (MISCELLANEOUS) ×1 IMPLANT
SUT VIC AB 0 CT1 36 (SUTURE) ×3 IMPLANT
SUT VIC AB 1 CT1 36 (SUTURE) ×3 IMPLANT
SUT VIC AB 2-0 CT1 27 (SUTURE) ×3
SUT VIC AB 2-0 CT1 TAPERPNT 27 (SUTURE) ×1 IMPLANT
TAPE MICROFOAM 4IN (TAPE) IMPLANT

## 2016-03-31 NOTE — Progress Notes (Addendum)
Pt scheduled for surgery at 8am. Currently sleeping. Family at bedside. Consent obtained. No jewelry, dentures, etc. Abx in chart. MRSA negative. FSBS 145, paged MD regarding SSI coverage. Awaiting response from Dr. Bridgett Larsson.  Per Dr. Bridgett Larsson, ok to hold SSI.

## 2016-03-31 NOTE — Progress Notes (Signed)
Pt returned from surgery. Report received from [place], [place] in PACU.

## 2016-03-31 NOTE — Transfer of Care (Signed)
Immediate Anesthesia Transfer of Care Note  Patient: Carrie Bishop  Procedure(s) Performed: Procedure(s): INTRAMEDULLARY (IM) NAIL INTERTROCHANTRIC (Right)  Patient Location: PACU  Anesthesia Type:General  Level of Consciousness: awake and alert   Airway & Oxygen Therapy: Patient Spontanous Breathing and Patient connected to face mask oxygen  Post-op Assessment: Report given to RN  Post vital signs: Reviewed and stable  Last Vitals:  Vitals:   03/31/16 0434 03/31/16 1004  BP: (!) 104/50 125/66  Pulse: 67 65  Resp: 16 14  Temp: 36.7 C (!) 36 C    Last Pain:  Vitals:   03/31/16 0621  TempSrc:   PainSc: 5       Patients Stated Pain Goal: 4 ([id number])  Complications: No apparent anesthesia complications

## 2016-03-31 NOTE — Anesthesia Procedure Notes (Signed)
Procedure Name: Intubation Date/Time: 03/31/2016 8:21 AM Performed by: [id number], Rose Hegner Pre-anesthesia Checklist: Timeout performed, Patient being monitored, Suction available, Emergency Drugs available and Patient identified Patient Re-evaluated:Patient Re-evaluated prior to inductionOxygen Delivery Method: Circle system utilized Preoxygenation: Pre-oxygenation with 100% oxygen Intubation Type: IV induction Grade View: Grade I Tube type: Oral Tube size: 7.0 mm Number of attempts: 1 Airway Equipment and Method: Stylet Placement Confirmation: breath sounds checked- equal and bilateral,  CO2 detector,  positive ETCO2 and ETT inserted through vocal cords under direct vision

## 2016-03-31 NOTE — Progress Notes (Signed)
Florist called to ask if pt would be here on Tuesday for delivery of flowers. Carrie Bishop advised them to call prior to delivery as pt may be discharged by then. Will notify family.

## 2016-03-31 NOTE — NC FL2 (Signed)
[place] LEVEL OF CARE SCREENING TOOL     IDENTIFICATION  Patient Name: Carrie Bishop Birthdate: [birth year] Sex: female Admission Date (Current Location): 03/30/2016  [place] and [place] Number:  Engineering geologist and Address:  [facility], [address] [place], [place] [address]      Provider Number: [id number]  Attending Physician Name and Address:  Demetrios Loll, MD  Relative Name and Phone Number:       Current Level of Care: Hospital Recommended Level of Care: [place] Prior Approval Number:    Date Approved/Denied: 06/29/12 PASRR Number: [id number]  Discharge Plan: SNF    Current Diagnoses: Patient Active Problem List   Diagnosis Date Noted  . Closed intertrochanteric fracture of femur ([place]) 03/30/2016  . Osteoporosis 11/07/2015  . Insomnia 11/07/2015  . Neuropathic pain 11/07/2015  . Depression 11/07/2015  . DM type 2 (diabetes mellitus, type 2) ([place]) 11/01/2015  . Intertrochanteric fracture of left hip ([place]) 10/20/2015  . Chronic renal insufficiency 10/20/2015  . Anemia 10/20/2015  . Closed left hip fracture ([place]) 10/20/2015  . Benign essential tremor 09/20/2015    Orientation RESPIRATION BLADDER Height & Weight     Self, Situation, Place  Normal Continent Weight: 117 lb (53.1 kg) Height:  5\' 6"  (167.6 cm)  BEHAVIORAL SYMPTOMS/MOOD NEUROLOGICAL BOWEL NUTRITION STATUS      Continent    AMBULATORY STATUS COMMUNICATION OF NEEDS Skin   Total Care Verbally Surgical wounds                       Personal Care Assistance Level of Assistance  Bathing, Dressing, Total care Bathing Assistance: Maximum assistance Feeding assistance: Independent Dressing Assistance: Maximum assistance Total Care Assistance: Maximum assistance   Functional Limitations Info             SPECIAL CARE FACTORS FREQUENCY  PT (By licensed PT)     PT Frequency: 5X day 5X week              Contractures  Contractures Info: Present    Additional Factors Info                  Current Medications (03/31/2016):  This is the current hospital active medication list Current Facility-Administered Medications  Medication Dose Route Frequency Provider Last Rate Last Dose  . 0.9 %  sodium chloride infusion   Intravenous Continuous Demetrios Loll, MD 100 mL/hr at 03/31/16 1130    . acetaminophen (OFIRMEV) IV 1,000 mg  1,000 mg Intravenous Q6H Dereck Leep, MD   1,000 mg at 03/31/16 1215  . acetaminophen (TYLENOL) tablet 650 mg  650 mg Oral Q6H PRN Dereck Leep, MD   650 mg at 03/31/16 1124   Or  . acetaminophen (TYLENOL) suppository 650 mg  650 mg Rectal Q6H PRN Dereck Leep, MD      . atorvastatin (LIPITOR) tablet 10 mg  10 mg Oral QHS Lytle Butte, MD   10 mg at 03/30/16 2100  . baclofen (LIORESAL) tablet 10 mg  10 mg Oral Q8H PRN Lytle Butte, MD      . bisacodyl (DULCOLAX) suppository 10 mg  10 mg Rectal Daily PRN Dereck Leep, MD      . ceFAZolin (ANCEF) IVPB 2g/100 mL premix  2 g Intravenous Q6H Dereck Leep, MD      . cholecalciferol (VITAMIN D) tablet 1,000 Units  1,000 Units Oral Daily Lytle Butte,  MD   1,000 Units at 03/30/16 2101  . DULoxetine (CYMBALTA) DR capsule 60 mg  60 mg Oral Daily Lytle Butte, MD      . Derrill Memo ON 04/01/2016] enoxaparin (LOVENOX) injection 40 mg  40 mg Subcutaneous Q24H Dereck Leep, MD      . ferrous sulfate tablet 325 mg  325 mg Oral BID WC Dereck Leep, MD      . insulin aspart (novoLOG) injection 0-9 Units  0-9 Units Subcutaneous TID WC Lytle Butte, MD      . magnesium hydroxide (MILK OF MAGNESIA) suspension 30 mL  30 mL Oral Daily PRN Dereck Leep, MD      . menthol-cetylpyridinium (CEPACOL) lozenge 3 mg  1 lozenge Oral PRN Dereck Leep, MD       Or  . phenol (CHLORASEPTIC) mouth spray 1 spray  1 spray Mouth/Throat PRN Dereck Leep, MD      . metoCLOPramide (REGLAN) tablet 5-10 mg  5-10 mg Oral Q8H PRN Dereck Leep, MD       Or  .  metoCLOPramide (REGLAN) injection 5-10 mg  5-10 mg Intravenous Q8H PRN Dereck Leep, MD      . metoCLOPramide (REGLAN) tablet 10 mg  10 mg Oral TID AC & HS Dereck Leep, MD      . morphine 2 MG/ML injection 2-4 mg  2-4 mg Intravenous Q4H PRN Dereck Leep, MD      . ondansetron (ZOFRAN) tablet 4 mg  4 mg Oral Q6H PRN Dereck Leep, MD       Or  . ondansetron (ZOFRAN) injection 4 mg  4 mg Intravenous Q6H PRN Dereck Leep, MD      . oxyCODONE (Oxy IR/ROXICODONE) immediate release tablet 5-10 mg  5-10 mg Oral Q4H PRN Dereck Leep, MD   5 mg at 03/31/16 1124  . pantoprazole (PROTONIX) EC tablet 40 mg  40 mg Oral BID Dereck Leep, MD      . primidone (MYSOLINE) tablet 50 mg  50 mg Oral QHS Lytle Butte, MD   50 mg at 03/30/16 2101  . senna-docusate (Senokot-S) tablet 1 tablet  1 tablet Oral BID Dereck Leep, MD      . sodium phosphate (FLEET) 7-19 GM/118ML enema 1 enema  1 enema Rectal Once PRN Dereck Leep, MD      . traMADol Veatrice [place]) tablet 50-100 mg  50-100 mg Oral Q4H PRN Dereck Leep, MD      . traZODone (DESYREL) tablet 100-200 mg  100-200 mg Oral QHS PRN Lytle Butte, MD   100 mg at 03/30/16 2101     Discharge Medications: Please see discharge summary for a list of discharge medications.  Relevant Imaging Results:  Relevant Lab Results:   Additional Information  SS [id number]  Zettie Pho, LCSW

## 2016-03-31 NOTE — Progress Notes (Signed)
Pt left floor for surgery.

## 2016-03-31 NOTE — Clinical Social Work Note (Signed)
Clinical Social Work Assessment  Patient Details  Name: Carrie Bishop MRN: [id number] Date of Birth: 05-12-1929  Date of referral:  03/31/16               Reason for consult:  Facility Placement                Permission sought to share information with:  Facility Art therapist granted to share information::  Yes, Verbal Permission Granted  Name::        Agency::     Relationship::     Contact Information:     Housing/Transportation Living arrangements for the past 2 months:  [place] of Information:  Adult Children Patient Interpreter Needed:  None Criminal Activity/Legal Involvement Pertinent to Current Situation/Hospitalization:  No - Comment as needed Significant Relationships:  Adult Children, Community Support Lives with:  Adult Children Do you feel safe going back to the place where you live?  Yes Need for family participation in patient care:  No (Coment)  Care giving concerns:  Possible SNF placement   Social Worker assessment / plan:  Patient was sleeping when CSW visited bedside. CSW spoke with Carrie Bishop, patient's son.  At baseline, patient lives with her daughter Carrie Bishop and son-in-law Carrie Bishop. [name] reported that [facility] concerning specific placement, and according to both [name] and Dr. Marry Guan, patient and family would not consider [place] for placement due to previous experiences.  PT is pending for patient, and family has given verbal consent to begin bed search. CSW to con't to follow for dc planning.  Employment status:  Retired Nurse, adult PT Recommendations:  Not assessed at this time Information / Referral to community resources:  [place]  Patient/Family's Response to care:  Family was pleasant and showed gratitude to the CSW for involvement.  Patient/Family's Understanding of and Emotional Response to Diagnosis, Current Treatment, and  Prognosis:  Family able to verbalize in their own terms potential dc plans.  Emotional Assessment Appearance:  Appears stated age Attitude/Demeanor/Rapport:   (Patient sedated post-op) Affect (typically observed):   (Patient sedated post-op) Orientation:   (Patient sedated post-op) Alcohol / Substance use:  Never Used Psych involvement (Current and /or in the community):  No (Comment)  Discharge Needs  Concerns to be addressed:  Discharge Planning Concerns Readmission within the last 30 days:  No Current discharge risk:  None Barriers to Discharge:  Continued Medical Work up   [employer], LCSW 03/31/2016, 4:21 PM

## 2016-03-31 NOTE — Anesthesia Preprocedure Evaluation (Signed)
Anesthesia Evaluation  Patient identified by MRN, date of birth, ID band  Reviewed: Allergy & Precautions, NPO status , Patient's Chart, lab work & pertinent test results  History of Anesthesia Complications Negative for: history of anesthetic complications  Airway Mallampati: II       Dental  (+) Upper Dentures, Lower Dentures, Edentulous Upper, Edentulous Lower   Pulmonary neg pulmonary ROS,    Pulmonary exam normal        Cardiovascular Exercise Tolerance: Poor hypertension, Pt. on medications  Rhythm:Regular Rate:Normal     Neuro/Psych PSYCHIATRIC DISORDERS (Depression) Depression CVA    GI/Hepatic negative GI ROS, Neg liver ROS,   Endo/Other  diabetes, Type 2, Oral Hypoglycemic Agents  Renal/GU Renal InsufficiencyRenal disease     Musculoskeletal   Abdominal Normal abdominal exam  (+)   Peds negative pediatric ROS (+)  Hematology  (+) anemia ,   Anesthesia Other Findings Past Medical History: No date: Depression No date: Diabetes mellitus without complication (HCC) No date: Hip fracture (H[facility])     Comment: left March 2017 No date: Hypertension No date: Neuropathy (H[facility]) No date: Osteoporosis No date: Restless leg syndrome No date: Stroke ([place]) No date: Tremor   Reproductive/Obstetrics negative OB ROS                             Anesthesia Physical  Anesthesia Plan  ASA: III  Anesthesia Plan: General   Post-op Pain Management:    Induction:   Airway Management Planned: Natural Airway and Nasal Cannula  Additional Equipment:   Intra-op Plan:   Post-operative Plan:   Informed Consent: I have reviewed the patients History and Physical, chart, labs and discussed the procedure including the risks, benefits and alternatives for the proposed anesthesia with the patient or authorized representative who has indicated his/her understanding and acceptance.     Plan  Discussed with: CRNA and Anesthesiologist  Anesthesia Plan Comments:         Anesthesia Quick Evaluation

## 2016-03-31 NOTE — Op Note (Signed)
OPERATIVE NOTE  DATE OF SURGERY:  03/30/2016 - 03/31/2016  PATIENT NAME:  Carrie Bishop   DOB: 01/07/1929  MRN: [id number]  PRE-OPERATIVE DIAGNOSIS: Right intertrochanteric femur fracture  POST-OPERATIVE DIAGNOSIS:  Same  PROCEDURE: Open reduction and internal fixation of a right intertrochanteric femur fracture   SURGEON:  Marciano Sequin. M.D.  ANESTHESIA: general  ESTIMATED BLOOD LOSS: 509 mL  FLUIDS REPLACED: 900 mL of crystalloid  DRAINS: None  IMPLANTS UTILIZED: Synthes 11 mm/130 trochanteric fixation nail, 95 mm helical blade, 36 mm x 5.0 mm locking screw  INDICATIONS FOR SURGERY: Carrie Bishop is a 82 y.o. year old female who fell and sustained a displaced right intertrochanteric femur fracture. After discussion of the risks and benefits of surgical intervention, the patient expressed understanding of the risks benefits and agree with plans for open reduction and internal fixation.   The risks, benefits, and alternatives were discussed at length including but not limited to the risks of infection, bleeding, nerve injury, stiffness, blood clots, the need for revision surgery, limb length inequality, cardiopulmonary complications, among others, and they were willing to proceed.  PROCEDURE IN DETAIL: The patient was brought into the operating room and, after adequate general anesthesia was achieved, patient was placed on the fracture table. All bony prominences were well-padded. The right lower extremity was placed in traction and a provisional reduction was performed and verified using the C-arm. The patient's right hip and leg were cleaned and prepped with alcohol and DuraPrep and draped in the usual sterile fashion. A "timeout" was performed as per usual protocol. A lateral incision was made extended from the proximal portion of the greater trochanter proximally. The fascia was incised in line with the skin incision and the fibers of the hip abductors were split in line. The  tip of the greater trochanter was palpated and a distally threaded guide pin was inserted into the tip of the greater trochanter and advanced into the medullary canal. Position was confirmed in both AP and lateral planes using the C-arm. A pilot hole was enlarged using a step drill. A Synthes 11 mm/130 trochanteric fixation nail was advanced over the guidepin and position confirmed using the C-arm. A second stab incision was made and the tissue protector was inserted through the outrigger device and advanced to the lateral cortex of the femur. A threaded screw guide pin was inserted into the femoral neck and head and position was again confirmed in both AP and lateral planes. Vision was were obtained and it was felt that a 95 mm helical blade was appropriate. The cortex was reamed and then a cannulated reamer was advanced over the guidepin to the appropriate depth. A 95 mm helical blade was then advanced over the guidepin and impacted into place. Good position was noted in multiple planes using the C-arm. The locking sleeve was engaged. Finally, a third stab incision was made and the tissue protector was inserted through the outrigger device and advanced the lateral cortex of the femur for placement of the distal locking screw. A 36 mm x 5.0 mm locking screw was then inserted. The outrigger device was removed. The hip was visualized in all planes using the C-arm with good reduction appreciated and good position of the hardware noted.  The wound was irrigated with copious amounts of normal saline with antibiotic solution and suctioned dry. Good hemostasis was appreciated. The fascia was reapproximated using interrupted sutures of #1 Vicryl. Subcutaneous tissue was approximated layers using first #0 Vicryl followed #  2-0 Vicryl. The skin was closed with skin staples. A sterile dressing was applied.  The patient tolerated the procedure well and was transported to the recovery room in stable condition.   Marciano Sequin., M.D.

## 2016-03-31 NOTE — Anesthesia Postprocedure Evaluation (Signed)
Anesthesia Post Note  Patient: Carrie Bishop  Procedure(s) Performed: Procedure(s) (LRB): INTRAMEDULLARY (IM) NAIL INTERTROCHANTRIC (Right)  Patient location during evaluation: PACU Anesthesia Type: General Level of consciousness: awake and alert Pain management: pain level controlled Vital Signs Assessment: post-procedure vital signs reviewed and stable Respiratory status: spontaneous breathing, nonlabored ventilation, respiratory function stable and patient connected to nasal cannula oxygen Cardiovascular status: blood pressure returned to baseline and stable Postop Assessment: no signs of nausea or vomiting Anesthetic complications: no    Last Vitals:  Vitals:   03/31/16 1115 03/31/16 1150  BP: (!) 104/45 (!) 93/53  Pulse: (!) 53 (!) 58  Resp:  16  Temp:      Last Pain:  Vitals:   03/31/16 1050  TempSrc:   PainSc: 0-No pain                 Martha Clan

## 2016-03-31 NOTE — Progress Notes (Signed)
[facility] [place] NAME: Carrie Bishop    MR#:  [id number]  DATE OF BIRTH:  27-Apr-1929  SUBJECTIVE:  CHIEF COMPLAINT:   Chief Complaint  Patient presents with  . Leg Pain   Right hip pain. S/p ORIF this am. REVIEW OF SYSTEMS:  Review of Systems  Constitutional: Positive for malaise/fatigue. Negative for chills and fever.  HENT: Negative for sore throat.   Eyes: Negative for blurred vision and double vision.  Respiratory: Negative for cough, hemoptysis, sputum production, shortness of breath, wheezing and stridor.   Cardiovascular: Negative for chest pain, palpitations and leg swelling.  Gastrointestinal: Positive for nausea. Negative for abdominal pain, blood in stool, melena and vomiting.  Genitourinary: Negative for dysuria and urgency.  Musculoskeletal: Positive for joint pain.  Skin: Negative for itching and rash.  Neurological: Negative for dizziness, tremors, seizures and headaches.  Psychiatric/Behavioral: Negative for depression.    DRUG ALLERGIES:  No Known Allergies VITALS:  Blood pressure (!) 112/50, pulse (!) 55, temperature (!) 96.3 F (35.7 C), temperature source Axillary, resp. rate 18, height 5\' 6"  (1.676 m), weight 117 lb (53.1 kg), SpO2 100 %. PHYSICAL EXAMINATION:  Physical Exam  Constitutional: No distress.  HENT:  Head: Normocephalic.  Mouth/Throat: Oropharynx is clear and moist.  Eyes: Conjunctivae and EOM are normal. Pupils are equal, round, and reactive to light. No scleral icterus.  Neck: Normal range of motion. Neck supple. No JVD present.  Cardiovascular: Normal rate, regular rhythm and normal heart sounds.  Exam reveals no gallop.   No murmur heard. Pulmonary/Chest: Effort normal and breath sounds normal. No respiratory distress. She has no wheezes. She has no rales.  Abdominal: Soft. Bowel sounds are normal. She exhibits no distension. There is no tenderness.  Musculoskeletal: She exhibits no  edema.  Lymphadenopathy:    She has no cervical adenopathy.  Neurological: She is alert.  AO 2, demented.  Skin: Skin is warm.  Psychiatric: Affect normal.   LABORATORY PANEL:   CBC  Recent Labs Lab 03/30/16 1503  WBC 6.7  HGB 12.0  HCT 34.9*  PLT 203   ------------------------------------------------------------------------------------------------------------------ Chemistries   Recent Labs Lab 03/30/16 1503  NA 135  K 4.4  CL 102  CO2 25  GLUCOSE 172*  BUN 18  CREATININE 0.81  CALCIUM 9.2  AST 25  ALT 14  ALKPHOS 69  BILITOT 0.5   RADIOLOGY:  Dg Knee 2 Views Right  Result Date: 03/30/2016 CLINICAL DATA:  Fall.  Pain. EXAM: RIGHT KNEE - 1-2 VIEW COMPARISON:  None. FINDINGS: There is a lucency which extends through the lateral tibial plateau. No displaced fracture fragments are identified and there is no joint effusion. There is moderate medial compartment osteoarthritis identified. IMPRESSION: 1. Suspicious lucency involving the lateral tibial plateau. Cannot rule out fracture. Recommend further evaluation with CT of the right knee. Electronically Signed   By: Kerby Moors M.D.   On: 03/30/2016 16:15   Ct Knee Right Wo Contrast  Result Date: 03/30/2016 CLINICAL DATA:  82 y/o F; status post fall with right leg pain from lower leg the hip. Unable to straighten right leg due to pain. EXAM: CT OF THE RIGHT KNEE WITHOUT CONTRAST TECHNIQUE: Multidetector CT imaging of the RIGHT knee was performed according to the standard protocol. Multiplanar CT image reconstructions were also generated. COMPARISON:  03/29/2016 right knee radiographs. FINDINGS: Bones/Joint/Cartilage No acute fracture is identified. Small low-attenuation joint effusion There is severe lateral femoral tibial compartment joint  space narrowing with apposition of bony articular surfaces which demonstrated sclerotic changes. There is mild narrowing of the medial femoral tibial compartment with chondrocalcinosis  of articular cartilage. There is mild narrowing of the patellofemoral compartment with minimal chondrocalcinosis of articular cartilage. There are small tricompartmental osteophytes. Bones are demineralized. Ligaments Suboptimally assessed by CT. Muscles and Tendons Diminished muscle bulk.  No hematoma. Soft tissues Extensive vascular calcifications. IMPRESSION: 1. No acute fracture is identified.  Small joint effusion. 2. Tricompartmental osteoarthrosis greatest in the lateral femoral tibial compartment where there is severe joint space narrowing and full thickness cartilage wear with apposition of bony articular surfaces with sclerotic changes. 3. Chondrocalcinosis. Electronically Signed   By: Kristine Garbe M.D.   On: 03/30/2016 18:40   Dg Chest Portable 1 View  Result Date: 03/30/2016 CLINICAL DATA:  Preoperative examination prior to right hip surgery for fracture. EXAM: PORTABLE CHEST 1 VIEW COMPARISON:  10/20/2015 FINDINGS: Mild cardiomegaly noted. There is no evidence of focal airspace disease, pulmonary edema, suspicious pulmonary nodule/mass, pleural effusion, or pneumothorax. No acute bony abnormalities are identified. IMPRESSION: Mild cardiomegaly without evidence of acute cardiopulmonary disease. Electronically Signed   By: Margarette Canada M.D.   On: 03/30/2016 16:25   Dg Hip Operative Unilat W Or W/o Pelvis Right  Result Date: 03/31/2016 CLINICAL DATA:  Internal fixation of intertrochanteric fracture right hip. EXAM: OPERATIVE right HIP (WITH PELVIS IF PERFORMED) 4 VIEWS TECHNIQUE: Fluoroscopic spot image(s) were submitted for interpretation post-operatively. COMPARISON:  None FINDINGS: There is a gamma nail and dynamic hip screw in good position without complicating features. A distal interlocking screw is also noted. IMPRESSION: Internal fixation of a nondisplaced intertrochanteric fracture of the right hip with a gamma nail and dynamic hip screw. Electronically Signed   By: Marijo Sanes  M.D.   On: 03/31/2016 09:44   Dg Hip Unilat W Or Wo Pelvis 2-3 Views Right  Result Date: 03/30/2016 CLINICAL DATA:  Distal RIGHT femoral pain, hip injury, history hypertension, diabetes mellitus, stroke EXAM: DG HIP (WITH OR WITHOUT PELVIS) 2-3V RIGHT COMPARISON:  LEFT pelvic radiograph 10/20/2015 FINDINGS: IM nail with compression screw proximal LEFT femur prostate healing intertrochanteric fracture. Marked osseous demineralization. Extensive deformity of the lateral aspect of the RIGHT iliac wing question irregular exostosis versus sequela of prior trauma. Nondisplaced intertrochanteric fracture RIGHT femur. Visualized pelvis intact. No additional fracture or dislocation identified. Extensive atherosclerotic calcifications. IMPRESSION: Nondisplaced intertrochanteric fracture RIGHT femur. Marked osseous demineralization. Post ORIF of an intertrochanteric fracture of the LEFT femur. Electronically Signed   By: Lavonia Dana M.D.   On: 03/30/2016 16:07   ASSESSMENT AND PLAN:   82 year old Caucasian female history of osteoporosis presenting after mechanical fall  1. Right intertrochanteric fracture: s/p ORIF this am. Pain control/bowel regiment, PT and DVT prophylaxis from tomorrow. Follow-up CBC and BMP.  2. Type 2 diabetes non-insulin-requiring: Hold oral agents add a sliding coverage 3. Essential hypertension: Hold lisinopril due to low BP, continue NS iv. 4. Essential tremor: hold Propranolol 5. Venous thrombi embolism prophylactic: SCDs  All the records are reviewed and case discussed with Care Management/Social Worker. Management plans discussed with the patient, her daughter and other FM and they are in agreement.  CODE STATUS: Full code  TOTAL TIME TAKING CARE OF THIS PATIENT: 35 minutes.   More than 50% of the time was spent in counseling/coordination of care: YES  POSSIBLE D/C IN 3 DAYS, DEPENDING ON CLINICAL CONDITION.   Demetrios Loll M.D on 03/31/2016 at 12:57 PM  Between 7am  to  6pm - Pager - [phone]  After 6pm go to [email or web address] - password [email or web address] [facility]  [facility] [place] [facility]  Office  [phone]  CC: Primary care physician; Cyndi Bender, PA-C  Note: This dictation was prepared with Dragon dictation along with smaller phrase technology. Any transcriptional errors that result from this process are unintentional.

## 2016-03-31 NOTE — Brief Op Note (Signed)
03/30/2016 - 03/31/2016  10:04 AM  PATIENT:  Carrie Bishop  82 y.o. female  PRE-OPERATIVE DIAGNOSIS:  right intertrochanteric femur fracture  POST-OPERATIVE DIAGNOSIS:  same  PROCEDURE:  Procedure(s): INTRAMEDULLARY (IM) NAIL INTERTROCHANTRIC (Right)  SURGEON:  Surgeon(s) and Role:    * Dereck Leep, MD - Primary  ASSISTANTS: none   ANESTHESIA:   general  EBL:  Total I/O In: 900 [I.V.:900] Out: 250 [Urine:200; Blood:50]  BLOOD ADMINISTERED:none  DRAINS: none   LOCAL MEDICATIONS USED:  NONE  SPECIMEN:  No Specimen  DISPOSITION OF SPECIMEN:  N/A  COUNTS:  YES  TOURNIQUET:  * No tourniquets in log *  DICTATION: .Dragon Dictation  PLAN OF CARE: Admit to inpatient   PATIENT DISPOSITION:  PACU - hemodynamically stable.   Delay start of Pharmacological VTE agent (>24hrs) due to surgical blood loss or risk of bleeding: yes

## 2016-04-01 LAB — CBC
HEMATOCRIT: 26.2 % — AB (ref 35.0–47.0)
HEMOGLOBIN: 9.2 g/dL — AB (ref 12.0–16.0)
MCH: 31.7 pg (ref 26.0–34.0)
MCHC: 35.2 g/dL (ref 32.0–36.0)
MCV: 90 fL (ref 80.0–100.0)
Platelets: 145 10*3/uL — ABNORMAL LOW (ref 150–440)
RBC: 2.91 MIL/uL — AB (ref 3.80–5.20)
RDW: 15.7 % — ABNORMAL HIGH (ref 11.5–14.5)
WBC: 5.1 10*3/uL (ref 3.6–11.0)

## 2016-04-01 LAB — BASIC METABOLIC PANEL
Anion gap: 2 — ABNORMAL LOW (ref 5–15)
BUN: 19 mg/dL (ref 6–20)
CHLORIDE: 105 mmol/L (ref 101–111)
CO2: 27 mmol/L (ref 22–32)
Calcium: 8.1 mg/dL — ABNORMAL LOW (ref 8.9–10.3)
Creatinine, Ser: 0.96 mg/dL (ref 0.44–1.00)
GFR calc Af Amer: >60 mL/min (ref >60)
GFR calc non Af Amer: 53 mL/min — ABNORMAL LOW (ref >60)
GLUCOSE: 140 mg/dL — AB (ref 65–99)
POTASSIUM: 4 mmol/L (ref 3.5–5.1)
SODIUM: 134 mmol/L — AB (ref 135–145)

## 2016-04-01 LAB — GLUCOSE, CAPILLARY
GLUCOSE-CAPILLARY: 155 mg/dL — AB (ref 65–99)
GLUCOSE-CAPILLARY: 124 mg/dL — AB (ref 65–99)
GLUCOSE-CAPILLARY: 130 mg/dL — AB (ref 65–99)
Glucose-Capillary: 122 mg/dL — ABNORMAL HIGH (ref 65–99)

## 2016-04-01 NOTE — Progress Notes (Signed)
Foley d/c'd with 650cc urine output

## 2016-04-01 NOTE — Progress Notes (Signed)
[facility] [place] NAME: Avary Bessire    MR#:  [id number]  DATE OF BIRTH:  05-01-1929  SUBJECTIVE:  CHIEF COMPLAINT:   Chief Complaint  Patient presents with  . Leg Pain   Better right hip pain. S/p ORIF POD1. REVIEW OF SYSTEMS:  Review of Systems  Constitutional: Positive for malaise/fatigue. Negative for chills and fever.  HENT: Negative for sore throat.   Eyes: Negative for blurred vision and double vision.  Respiratory: Negative for cough, hemoptysis, sputum production, shortness of breath, wheezing and stridor.   Cardiovascular: Negative for chest pain, palpitations and leg swelling.  Gastrointestinal: Negative for abdominal pain, blood in stool, melena, nausea and vomiting.  Genitourinary: Negative for dysuria and urgency.  Musculoskeletal: Positive for joint pain.  Skin: Negative for itching and rash.  Neurological: Negative for dizziness, tremors, seizures and headaches.  Psychiatric/Behavioral: Negative for depression.    DRUG ALLERGIES:  No Known Allergies VITALS:  Blood pressure 110/77, pulse 100, temperature 97.8 F (36.6 C), resp. rate 18, height 5\' 6"  (1.676 m), weight 117 lb (53.1 kg), SpO2 99 %. PHYSICAL EXAMINATION:  Physical Exam  Constitutional: No distress.  HENT:  Head: Normocephalic.  Mouth/Throat: Oropharynx is clear and moist.  Eyes: Conjunctivae and EOM are normal. Pupils are equal, round, and reactive to light. No scleral icterus.  Neck: Normal range of motion. Neck supple. No JVD present.  Cardiovascular: Normal rate, regular rhythm and normal heart sounds.  Exam reveals no gallop.   No murmur heard. Pulmonary/Chest: Effort normal and breath sounds normal. No respiratory distress. She has no wheezes. She has no rales.  Abdominal: Soft. Bowel sounds are normal. She exhibits no distension. There is no tenderness.  Musculoskeletal: She exhibits no edema.  Lymphadenopathy:    She has no cervical  adenopathy.  Neurological: She is alert.  AO 80, demented.  Skin: Skin is warm.  Psychiatric: Affect normal.   LABORATORY PANEL:   CBC  Recent Labs Lab 04/01/16 0355  WBC 5.1  HGB 9.2*  HCT 26.2*  PLT 145*   ------------------------------------------------------------------------------------------------------------------ Chemistries   Recent Labs Lab 03/30/16 1503 04/01/16 0355  NA 135 134*  K 4.4 4.0  CL 102 105  CO2 25 27  GLUCOSE 172* 140*  BUN 18 19  CREATININE 0.81 0.96  CALCIUM 9.2 8.1*  AST 25  --   ALT 14  --   ALKPHOS 69  --   BILITOT 0.5  --    RADIOLOGY:  No results found. ASSESSMENT AND PLAN:   82 year old Caucasian female history of osteoporosis presenting after mechanical fall  1. Right intertrochanteric fracture: s/p ORIF POD1. Pain control/bowel regiment, PT and DVT prophylaxis. Follow-up CBC and BMP.  2. Type 2 diabetes non-insulin-requiring: Hold oral agents, on sliding coverage 3. Essential hypertension: Hold lisinopril due to low BP, continue NS iv. 4. Essential tremor: hold Propranolol 5. Venous thrombi embolism prophylactic: lovenox.  All the records are reviewed and case discussed with Care Management/Social Worker. Management plans discussed with the patient, her daughter and other FM and they are in agreement.  CODE STATUS: Full code  TOTAL TIME TAKING CARE OF THIS PATIENT: 35 minutes.   More than 50% of the time was spent in counseling/coordination of care: YES  POSSIBLE D/C IN 2 DAYS, DEPENDING ON CLINICAL CONDITION.   Demetrios Loll M.D on 04/01/2016 at 11:58 AM  Between 7am to 6pm - Pager - [phone]  After 6pm go to [email or web address] - password [email or web address]  [facility]  Office  [phone]  CC: Primary care physician; Cyndi Bender, PA-C  Note: This dictation was prepared with Dragon dictation along with smaller phrase technology. Any transcriptional errors that result from this process are  unintentional.

## 2016-04-01 NOTE — Progress Notes (Signed)
Pt. Alert and oriented. VSS. Pain has been an issue tonight. Meds given per MAR. Pt. Up most of the night crying at times with pain. IS at the bedside and pt. Encouraged to use it and I have assisted her multiple times with IS machine. Pt. Dangled at the bedside. Did good but was having extreme pain after being dangled. Heels elevated with pillows. Resting quietly at this time. Will continue to monitor.

## 2016-04-01 NOTE — Progress Notes (Signed)
   Subjective: 1 Day Post-Op Procedure(s) (LRB): INTRAMEDULLARY (IM) NAIL INTERTROCHANTRIC (Right) Patient reports pain as mild.   Patient is well, and has had no acute complaints or problems Denies any CP, SOB, ABD pain. We will continue therapy today.  Plan is to go Skilled nursing facility after hospital stay.  Objective: Vital signs in last 24 hours: Temp:  [96.3 F (35.7 C)-98.5 F (36.9 C)] 97.4 F (36.3 C) (09/03 0747) Pulse Rate:  [52-79] 76 (09/03 0747) Resp:  [12-18] 18 (09/03 0747) BP: (91-125)/(30-66) 116/50 (09/03 0747) SpO2:  [96 %-100 %] 100 % (09/03 0747)  Intake/Output from previous day: 09/02 0701 - 09/03 0700 In: 3493.3 [P.O.:240; I.V.:2653.3; IV Piggyback:600] Out: 900 [Urine:850; Blood:50] Intake/Output this shift: No intake/output data recorded.   Recent Labs  03/30/16 1503 04/01/16 0355  HGB 12.0 9.2*    Recent Labs  03/30/16 1503 04/01/16 0355  WBC 6.7 5.1  RBC 3.91 2.91*  HCT 34.9* 26.2*  PLT 203 145*    Recent Labs  03/30/16 1503 04/01/16 0355  NA 135 134*  K 4.4 4.0  CL 102 105  CO2 25 27  BUN 18 19  CREATININE 0.81 0.96  GLUCOSE 172* 140*  CALCIUM 9.2 8.1*    Recent Labs  03/30/16 1503  INR 1.08    EXAM General - Patient is Alert, Appropriate and Oriented Extremity - Neurovascular intact Sensation intact distally Intact pulses distally Dorsiflexion/Plantar flexion intact No cellulitis present Compartment soft Dressing - dressing C/D/I, moderate drainage and new dressing applied Motor Function - intact, moving foot and toes well on exam.   Past Medical History:  Diagnosis Date  . Depression   . Diabetes mellitus without complication ([place])   . Hip fracture [facility])    left March 2017  . Hypertension   . Neuropathy ([place])   . Osteoporosis   . Restless leg syndrome   . Stroke ([place])   . Tremor     Assessment/Plan:   1 Day Post-Op Procedure(s) (LRB): INTRAMEDULLARY (IM) NAIL INTERTROCHANTRIC (Right) Active  Problems:   Closed intertrochanteric fracture of femur ([facility])   Acute post op blood loss anemia   Estimated body mass index is 18.88 kg/m as calculated from the following:   Height as of this encounter: 5\' 6"  (1.676 m).   Weight as of this encounter: 53.1 kg (117 lb). Advance diet Up with therapy  Needs BM Hgb 9.2, recheck labs in the am CM to assist with discharge    DVT Prophylaxis - Lovenox, Foot Pumps and TED hose Weight-Bearing as tolerated to right leg   T. Rachelle Hora, PA-C [place] 04/01/2016, 8:58 AM

## 2016-04-01 NOTE — Evaluation (Signed)
Physical Therapy Evaluation Patient Details Name: Carrie Bishop MRN: [id number] DOB: Nov 17, 1928 Today's Date: 04/01/2016   History of Present Illness  82 yo female with history of fall with R hip ORIFand IM nailing, cleared for fracture with CT and has OA.  PMHx:  DM, L hip fracture March 2017, stroke, HTN, neuropathy, RLS, remors, osteoporosis   Clinical Impression  Pt is demonstrating anxiety related to pain fears, but has good ability at baseline and is able to step initially with PT.  Recommending CIR due to her family support at home 24/7 and the ability pt has previously had to get around on RW with no assistance. Has persistent falls and will benefit from the intensive work with therapy interventions to decrease the issues related to these occurrences.  Follow acutely for gait and balance as well.    Follow Up Recommendations CIR    Equipment Recommendations  None recommended by PT    Recommendations for Other Services Rehab consult     Precautions / Restrictions Precautions Precautions: Fall Precaution Comments: has new R ORIF and previous L hip fracture this year Restrictions Weight Bearing Restrictions: Yes RLE Weight Bearing: Weight bearing as tolerated      Mobility  Bed Mobility Overal bed mobility: Needs Assistance Bed Mobility: Supine to Sit     Supine to sit: Mod assist;Max assist     General bed mobility comments: cued all sequence and used bed pad to slide to EOB  Transfers Overall transfer level: Needs assistance   Transfers: Sit to/from [employer] Transfers Sit to Stand: Mod assist;From elevated surface Stand pivot transfers: Mod assist;From elevated surface (used no AD but held onto pt as she could not follow cues)       General transfer comment: needed to do pivot with pt directly as she did not follow instructions with RW and releases her hands to reach for objects and furniture instead of taking walker with her  Ambulation/Gait              General Gait Details: unable to attempt due to pt not following instructions for RW well, releases it and reaches for objects  Stairs            Wheelchair Mobility    Modified Rankin (Stroke Patients Only)       Balance Overall balance assessment: Needs assistance Sitting-balance support: Feet supported Sitting balance-Leahy Scale: Fair   Postural control: Posterior lean Standing balance support: Bilateral upper extremity supported Standing balance-Leahy Scale: Poor Standing balance comment: stands with RW well but cannot translate into more than 2 steps with transfer before pt starts to panic a bit and reach for furniture                             Pertinent Vitals/Pain Pain Assessment: 0-10 Pain Score: 9  Pain Location: R hip Pain Descriptors / Indicators: Aching;Cramping;Operative site guarding Pain Intervention(s): Monitored during session;Premedicated before session;Repositioned (Nursing verified to pt that she had had all the meds ordered)    Home Living Family/patient expects to be discharged to:: Private residence Living Arrangements: Children Available Help at Discharge: Family Type of Home: Mobile home Home Access: Ramped entrance     Home Layout: One level Home Equipment: Environmental consultant - 2 wheels      Prior Function                 Hand Dominance   Dominant Hand: Right  Extremity/Trunk Assessment   Upper Extremity Assessment: Generalized weakness           Lower Extremity Assessment: Generalized weakness      Cervical / Trunk Assessment: Kyphotic  Communication   Communication: No difficulties  Cognition Arousal/Alertness: Awake/alert Behavior During Therapy: Anxious Overall Cognitive Status: No family/caregiver present to determine baseline cognitive functioning       Memory: Decreased recall of precautions;Decreased short-term memory              General Comments      Exercises         Assessment/Plan    PT Assessment Patient needs continued PT services  PT Diagnosis Acute pain;Difficulty walking   PT Problem List Decreased strength;Decreased range of motion;Decreased activity tolerance;Decreased balance;Decreased mobility;Decreased coordination;Decreased cognition;Decreased knowledge of use of DME;Decreased safety awareness;Decreased knowledge of precautions;Decreased skin integrity;Pain  PT Treatment Interventions DME instruction;Gait training;Functional mobility training;Therapeutic activities;Therapeutic exercise;Balance training;Neuromuscular re-education;Patient/family education   PT Goals (Current goals can be found in the Care Plan section) Acute Rehab PT Goals Patient Stated Goal: to avoid falls PT Goal Formulation: With patient Time For Goal Achievement: 04/15/16 Potential to Achieve Goals: Good    Frequency 7X/week   Barriers to discharge Inaccessible home environment (ramped entrance)      Co-evaluation               End of Session Equipment Utilized During Treatment: Gait belt;Oxygen Activity Tolerance: Patient tolerated treatment well;Patient limited by pain;Treatment limited secondary to agitation Patient left: in chair;with call bell/phone within reach;with chair alarm set Nurse Communication: Mobility status (instruction for transfers to bed from chair)         Time: [id number] PT Time Calculation (min) (ACUTE ONLY): 39 min   Charges:   PT Evaluation $PT Eval Moderate Complexity: 1 Procedure PT Treatments $Therapeutic Activity: 23-37 mins   PT G CodesRamond Dial 04-19-16, 12:52 PM    Mee Hives, PT MS Acute Rehab Dept. Number: [place] [facility] [place]

## 2016-04-02 LAB — GLUCOSE, CAPILLARY
GLUCOSE-CAPILLARY: 117 mg/dL — AB (ref 65–99)
GLUCOSE-CAPILLARY: 171 mg/dL — AB (ref 65–99)
GLUCOSE-CAPILLARY: 145 mg/dL — AB (ref 65–99)
Glucose-Capillary: 143 mg/dL — ABNORMAL HIGH (ref 65–99)

## 2016-04-02 LAB — CBC
HCT: 26.5 % — ABNORMAL LOW (ref 35.0–47.0)
Hemoglobin: 9.3 g/dL — ABNORMAL LOW (ref 12.0–16.0)
MCH: 31.5 pg (ref 26.0–34.0)
MCHC: 35 g/dL (ref 32.0–36.0)
MCV: 90 fL (ref 80.0–100.0)
PLATELETS: 151 10*3/uL (ref 150–440)
RBC: 2.94 MIL/uL — ABNORMAL LOW (ref 3.80–5.20)
RDW: 15.6 % — AB (ref 11.5–14.5)
WBC: 5.1 10*3/uL (ref 3.6–11.0)

## 2016-04-02 LAB — BASIC METABOLIC PANEL
ANION GAP: 5 (ref 5–15)
BUN: 13 mg/dL (ref 6–20)
CALCIUM: 8.4 mg/dL — AB (ref 8.9–10.3)
CO2: 28 mmol/L (ref 22–32)
Chloride: 103 mmol/L (ref 101–111)
Creatinine, Ser: 0.61 mg/dL (ref 0.44–1.00)
GFR calc Af Amer: >60 mL/min (ref >60)
GLUCOSE: 128 mg/dL — AB (ref 65–99)
Potassium: 3.7 mmol/L (ref 3.5–5.1)
SODIUM: 136 mmol/L (ref 135–145)

## 2016-04-02 MED ORDER — OXYCODONE HCL 5 MG PO TABS: 5.0000 mg | ORAL_TABLET | ORAL | 0 refills | Status: DC | PRN

## 2016-04-02 MED ORDER — TRAMADOL HCL 50 MG PO TABS: 50.0000 mg | ORAL_TABLET | ORAL | 0 refills | Status: DC | PRN

## 2016-04-02 MED ORDER — PROPRANOLOL HCL ER 80 MG PO CP24
80.0000 mg | ORAL_CAPSULE | Freq: Every day | ORAL | Status: DC
  Administered 2016-04-02 – 2016-04-03 (×2): 80 mg via ORAL
  Filled 2016-04-02 (×2): qty 1

## 2016-04-02 MED ORDER — LISINOPRIL 20 MG PO TABS
20.0000 mg | ORAL_TABLET | Freq: Every day | ORAL | Status: DC
  Administered 2016-04-02 – 2016-04-03 (×2): 20 mg via ORAL
  Filled 2016-04-02 (×2): qty 1

## 2016-04-02 MED ORDER — ENOXAPARIN SODIUM 40 MG/0.4ML ~~LOC~~ SOLN: 40.0000 mg | SUBCUTANEOUS | 0 refills | Status: DC

## 2016-04-02 NOTE — Progress Notes (Signed)
Physical Therapy Treatment Patient Details Name: Carrie Bishop MRN: [id number] DOB: 07-01-1929 Today's Date: 04/02/2016    History of Present Illness Pt. is an 82 y.o. female who sustained a Right Hip fracture s/p IM nailing.    PT Comments    Patient appears to be experiencing less pain today, and is generally more agreeable to participate in therapy. She is able to complete AAROM on RLE for bed level exercises, difficulty producing sufficient knee extensor strength at this time. Patient does not have any overt buckling, though requires physical assistance to offload LEs during gait. She is able to ambulate further distance in this session than previous, though fatigues quickly. Patient would benefit from STR given the above and her previous activity level.   Follow Up Recommendations  SNF     Equipment Recommendations  Rolling walker with 5" wheels    Recommendations for Other Services       Precautions / Restrictions Precautions Precautions: Fall Precaution Comments: has new R ORIF and previous L hip fracture this year Restrictions Weight Bearing Restrictions: Yes RLE Weight Bearing: Weight bearing as tolerated    Mobility  Bed Mobility Overal bed mobility: Needs Assistance Bed Mobility: Supine to Sit     Supine to sit: Mod assist;Max assist     General bed mobility comments: Patient required total assist for LEs to transfer to EOB, able to provide limited assistance with elevating torso.   Transfers Overall transfer level: Needs assistance Equipment used: Rolling walker (2 wheeled) Transfers: Sit to/from Stand Sit to Stand: Mod assist Stand pivot transfers:  (used no AD but held onto pt as she could not follow cues)       General transfer comment: Patient required significant assistance with sit to stand transfer for instructions and physical assist.   Ambulation/Gait   Ambulation Distance (Feet): 6 Feet Assistive device: Rolling walker (2 wheeled) Gait  Pattern/deviations: Step-to pattern;Decreased step length - right;Decreased step length - left;Decreased stance time - left;Decreased stance time - right Gait velocity: Patient able to bear weight through RLE better in this session though still requires PT assist to offload due to weakness, fatigues quickly.  Gait velocity interpretation: <1.8 ft/sec, indicative of risk for recurrent falls General Gait Details: unable to attempt due to pt not following instructions for RW well, releases it and reaches for objects   Stairs            Wheelchair Mobility    Modified Rankin (Stroke Patients Only)       Balance Overall balance assessment: Needs assistance Sitting-balance support: Bilateral upper extremity supported;Feet supported Sitting balance-Leahy Scale: Fair Sitting balance - Comments: No physical assistance required however unable to maintain balance without bilateral UE assist.  Postural control: Posterior lean Standing balance support: Bilateral upper extremity supported Standing balance-Leahy Scale: Poor Standing balance comment: No overt buckling, posterior lean and several near buckles noted.                     Cognition Arousal/Alertness: Awake/alert Behavior During Therapy: WFL for tasks assessed/performed Overall Cognitive Status: No family/caregiver present to determine baseline cognitive functioning       Memory: Decreased recall of precautions              Exercises General Exercises - Lower Extremity Ankle Circles/Pumps: AROM;10 reps;Both Gluteal Sets: AROM;Both;10 reps Long Arc Quad: AROM;10 reps;Both;AAROM;Right Heel Slides: AAROM;Right;AROM;Left;10 reps Hip ABduction/ADduction: AAROM;Right;AROM;Left;10 reps Straight Leg Raises: AROM;Left;AAROM;Right;10 reps Hip Flexion/Marching: AAROM;Right;AROM;Left;10 reps    General  Comments        Pertinent Vitals/Pain Pain Assessment: 0-10 Pain Score:  ("Just a little in my leg") Pain  Location: R hip  Pain Descriptors / Indicators: Aching Pain Intervention(s): Limited activity within patient's tolerance;Monitored during session;Repositioned    Home Living Family/patient expects to be discharged to:: Private residence Living Arrangements: Alone Available Help at Discharge: Family Type of Home: Mobile home Home Access: Ramped entrance   Home Layout: One level Home Equipment: Tub bench      Prior Function Level of Independence: Independent with assistive device(s)          PT Goals (current goals can now be found in the care plan section) Acute Rehab PT Goals Patient Stated Goal: To regain independence PT Goal Formulation: With patient Time For Goal Achievement: 04/15/16 Potential to Achieve Goals: Good Progress towards PT goals: Progressing toward goals    Frequency  BID    PT Plan Current plan remains appropriate;Frequency needs to be updated;Discharge plan needs to be updated    Co-evaluation             End of Session Equipment Utilized During Treatment: Gait belt;Oxygen Activity Tolerance: Patient tolerated treatment well;Patient limited by fatigue Patient left: in chair;with chair alarm set;with call bell/phone within reach     Time: [id number] PT Time Calculation (min) (ACUTE ONLY): 24 min  Charges:  $Gait Training: 8-22 mins $Therapeutic Exercise: 8-22 mins                    G Codes:      Kerman Passey, PT, DPT    04/02/2016, 12:52 PM

## 2016-04-02 NOTE — Progress Notes (Signed)
Patient is A&O, but forgetful. Needs qeuing and a lot of encouragement for movement. A strong assist x2 from [facility] to chair. Tolerated all morning in the chair. HOH. Dressing to right hip is intact. No BM this shift. Gave one dose of oral pain meds with good relief. PPP. CMTS WNL. Blood sugars 117, 171, and 143, received SS insulin when indicated. Bed/chair alarm on for safety.

## 2016-04-02 NOTE — Progress Notes (Signed)
   Subjective: 2 Days Post-Op Procedure(s) (LRB): INTRAMEDULLARY (IM) NAIL INTERTROCHANTRIC (Right) Patient reports pain as 8 on 0-10 scale.   Patient is well, and has had no acute complaints or problems. Denies any CP, SOB, ABD pain. We will continue therapy today.  Plan is to go Skilled nursing facility after hospital stay.  Objective: Vital signs in last 24 hours: Temp:  [97.4 F (36.3 C)-98.5 F (36.9 C)] 98.2 F (36.8 C) (09/04 0403) Pulse Rate:  [76-100] 93 (09/04 0403) Resp:  [16-20] 16 (09/04 0403) BP: (110-156)/(50-77) 156/67 (09/04 0403) SpO2:  [97 %-100 %] 98 % (09/04 0403)  Intake/Output from previous day: 09/03 0701 - 09/04 0700 In: 1192.9 [P.O.:600; I.V.:592.9] Out: 250 [Urine:250] Intake/Output this shift: No intake/output data recorded.   Recent Labs  03/30/16 1503 04/01/16 0355 04/02/16 0349  HGB 12.0 9.2* 9.3*    Recent Labs  04/01/16 0355 04/02/16 0349  WBC 5.1 5.1  RBC 2.91* 2.94*  HCT 26.2* 26.5*  PLT 145* 151    Recent Labs  04/01/16 0355 04/02/16 0349  NA 134* 136  K 4.0 3.7  CL 105 103  CO2 27 28  BUN 19 13  CREATININE 0.96 0.61  GLUCOSE 140* 128*  CALCIUM 8.1* 8.4*    Recent Labs  03/30/16 1503  INR 1.08    EXAM General - Patient is Alert, Appropriate and Oriented Extremity - Neurovascular intact Sensation intact distally Intact pulses distally Dorsiflexion/Plantar flexion intact No cellulitis present Compartment soft  No swelling in lower extremities Dressing - dressing C/D/I, moderate drainage and new dressing applied Motor Function - intact, moving foot and toes well on exam.   Past Medical History:  Diagnosis Date  . Depression   . Diabetes mellitus without complication ([place])   . Hip fracture [facility])    left March 2017  . Hypertension   . Neuropathy ([place])   . Osteoporosis   . Restless leg syndrome   . Stroke ([place])   . Tremor     Assessment/Plan:   2 Days Post-Op Procedure(s) (LRB): INTRAMEDULLARY  (IM) NAIL INTERTROCHANTRIC (Right) Active Problems:   Closed intertrochanteric fracture of femur ([facility])   Acute post op blood loss anemia   Estimated body mass index is 18.88 kg/m as calculated from the following:   Height as of this encounter: 5\' 6"  (1.676 m).   Weight as of this encounter: 53.1 kg (117 lb). Advance diet Up with therapy  Needs BM Acute post op blood loss anemia, Hgb stable  CM to assist with discharge, likely SNF tomorrow Remove staples and apply steri strips 04/14/16.  Follow up with [facility] orthopedics in 6 weeks. Continue with TED hose BLE, remove at night       DVT Prophylaxis - Lovenox, Foot Pumps and TED hose Weight-Bearing as tolerated to right leg   T. Rachelle Hora, PA-C [facility] 04/02/2016, 7:31 AM

## 2016-04-02 NOTE — Progress Notes (Signed)
Clinical Education officer, museum (CSW) met with patient's daughter [name] and presented bed offers. Per daughter she will review offers and call CSW when they have made a SNF choice. CSW will continue to follow and assist as needed.   [employer], LCSW [phone]

## 2016-04-02 NOTE — Evaluation (Signed)
Occupational Therapy Evaluation Patient Details Name: LEILI KAYLOR MRN: [id number] DOB: 1928/12/03 Today's Date: 04/02/2016    History of Present Illness Pt. is an 82 y.o. female who sustained a Right Hip fracture s/p IM nailing.   Clinical Impression   Pt. Is an 82 y.o. female who was admitted to [facility] for surgical intervention following a right Hip Fracture s/p ORIF, IM nailing. Pt. Presents with pain, limited ROM, weakness, decreased functional mobility, and limited activity tolerance which hinder her ability to complete ADL tasks. Pt. Could benefit from skilled OT services to review adaptive equipment training for LE ADLs, review work simplification techniques, review compensatory strategies for UE tremors, and to improve ADL functioning in order to work towards returning to her PLOF. Pt. Would be a good candidate for SNF level of care.    Follow Up Recommendations  SNF    Equipment Recommendations       Recommendations for Other Services PT consult     Precautions / Restrictions        Mobility Bed Mobility    Pt. Up in chair upon arrival, just finished with PT.                                                                                  ADL Overall ADL's : Needs assistance/impaired Eating/Feeding: Set up   Grooming: Set up           Upper Body Dressing : Min guard   Lower Body Dressing: Moderate assistance                 General ADL Comments: Pt. education was provided about A/E use for LE dressing. Pt. edcuation was provided about compensatory strategies for tremors.     Vision     Perception     Praxis      Pertinent Vitals/Pain Pain Assessment: 0-10 Pain Score: 0-No pain Pain Location: Right Hip Pain Descriptors / Indicators: Aching;Cramping Pain Intervention(s): Limited activity within patient's tolerance;Repositioned     Hand Dominance Right   Extremity/Trunk Assessment Upper Extremity  Assessment Upper Extremity Assessment: Generalized weakness (Pt. has tremors.)           Communication Communication Communication: No difficulties   Cognition Arousal/Alertness: Awake/alert Behavior During Therapy: Anxious Overall Cognitive Status: No family/caregiver present to determine baseline cognitive functioning       Memory: Decreased recall of precautions             General Comments       Exercises       Shoulder Instructions      Home Living Family/patient expects to be discharged to:: Private residence Living Arrangements: Alone Available Help at Discharge: Family Type of Home: Mobile home Home Access: Ramped entrance     Home Layout: One level     Bathroom Shower/Tub: Tub/shower unit Shower/tub characteristics: Architectural technologist: Standard     Home Equipment: Tub bench          Prior Functioning/Environment Level of Independence: Independent with assistive device(s)             OT Diagnosis: Generalized weakness   OT Problem List: Decreased strength;Decreased activity tolerance;Decreased knowledge of  use of DME or AE;Decreased safety awareness;Pain   OT Treatment/Interventions: Self-care/ADL training;Therapeutic exercise;Neuromuscular education;Energy conservation;DME and/or AE instruction;Therapeutic activities;Patient/family education    OT Goals(Current goals can be found in the care plan section) Acute Rehab OT Goals Patient Stated Goal: To regain independence OT Goal Formulation: With patient Potential to Achieve Goals: Good  OT Frequency: Min 2X/week   Barriers to D/C: Decreased caregiver support          Co-evaluation              End of Session    Activity Tolerance: Patient tolerated treatment well Patient left: in chair;with chair alarm set;with call bell/phone within reach   Time: 1000-1035 OT Time Calculation (min): 35 min Charges:  OT Evaluation $OT Eval Moderate Complexity: 1 Procedure OT  Treatments $Self Care/Home Management : 8-22 mins G-Codes:    Harrel Carina, MS, OTR/L 04/02/2016, 11:10 AM

## 2016-04-02 NOTE — Clinical Social Work Placement (Signed)
   CLINICAL SOCIAL WORK PLACEMENT  NOTE  Date:  04/02/2016  Patient Details  Name: PATRISE WILLIAMSEN MRN: [id number] Date of Birth: 1928-10-18  Clinical Social Work is seeking post-discharge placement for this patient at the [place] level of care (*CSW will initial, date and re-position this form in  chart as items are completed):  Yes   Patient/family provided with [facility] Work Department's list of facilities offering this level of care within the geographic area requested by the patient (or if unable, by the patient's family).  Yes   Patient/family informed of their freedom to choose among providers that offer the needed level of care, that participate in Medicare, Medicaid or managed care program needed by the patient, have an available bed and are willing to accept the patient.  Yes   Patient/family informed of [facility]'s ownership interest in [facility] and [facility], as well as of the fact that they are under no obligation to receive care at these facilities.  PASRR submitted to EDS on       PASRR number received on       Existing PASRR number confirmed on 04/02/16     FL2 transmitted to all facilities in geographic area requested by pt/family on 04/01/16     FL2 transmitted to all facilities within larger geographic area on       Patient informed that his/her managed care company has contracts with or will negotiate with certain facilities, including the following:        Yes   Patient/family informed of bed offers received.  Patient chooses bed at  [facility] )     Physician recommends and patient chooses bed at      Patient to be transferred to   on  .  Patient to be transferred to facility by       Patient family notified on   of transfer.  Name of family member notified:        PHYSICIAN       Additional Comment:    _______________________________________________ Mckynlee Luse, Veronia Beets, LCSW 04/02/2016,  12:44 PM

## 2016-04-02 NOTE — Progress Notes (Signed)
Clinical Education officer, museum (CSW) received a call from patient's daughter stating that they have chosen [facility]. CSW sent [facility] admissions coordinator at [facility] a message making him aware of accepted bed offer. CSW will continue to follow and assist as needed.   [employer], LCSW [phone]

## 2016-04-02 NOTE — Progress Notes (Signed)
[facility] [place] NAME: Johnny Bertin    MR#:  [id number]  DATE OF BIRTH:  01/01/1929  SUBJECTIVE:  CHIEF COMPLAINT:   Chief Complaint  Patient presents with  . Leg Pain   No complaint.. S/p ORIF POD2. REVIEW OF SYSTEMS:  Review of Systems  Constitutional: Negative for chills, fever and malaise/fatigue.  HENT: Negative for sore throat.   Eyes: Negative for blurred vision and double vision.  Respiratory: Negative for cough, hemoptysis, sputum production, shortness of breath, wheezing and stridor.   Cardiovascular: Negative for chest pain, palpitations and leg swelling.  Gastrointestinal: Negative for abdominal pain, blood in stool, melena, nausea and vomiting.  Genitourinary: Negative for dysuria and urgency.  Musculoskeletal: Negative for joint pain.  Skin: Negative for itching and rash.  Neurological: Negative for dizziness, tremors, seizures and headaches.  Psychiatric/Behavioral: Negative for depression.    DRUG ALLERGIES:  No Known Allergies VITALS:  Blood pressure (!) 167/75, pulse 91, temperature 97.6 F (36.4 C), temperature source Oral, resp. rate 18, height 5\' 6"  (1.676 m), weight 117 lb (53.1 kg), SpO2 100 %. PHYSICAL EXAMINATION:  Physical Exam  Constitutional: No distress.  HENT:  Head: Normocephalic.  Mouth/Throat: Oropharynx is clear and moist.  Eyes: Conjunctivae and EOM are normal. Pupils are equal, round, and reactive to light. No scleral icterus.  Neck: Normal range of motion. Neck supple. No JVD present.  Cardiovascular: Normal rate, regular rhythm and normal heart sounds.  Exam reveals no gallop.   No murmur heard. Pulmonary/Chest: Effort normal and breath sounds normal. No respiratory distress. She has no wheezes. She has no rales.  Abdominal: Soft. Bowel sounds are normal. She exhibits no distension. There is no tenderness.  Musculoskeletal: She exhibits no edema.  Lymphadenopathy:    She has no  cervical adenopathy.  Neurological: She is alert.  AO 80, demented.  Skin: Skin is warm.  Psychiatric: Affect normal.   LABORATORY PANEL:   CBC  Recent Labs Lab 04/02/16 0349  WBC 5.1  HGB 9.3*  HCT 26.5*  PLT 151   ------------------------------------------------------------------------------------------------------------------ Chemistries   Recent Labs Lab 03/30/16 1503  04/02/16 0349  NA 135  < > 136  K 4.4  < > 3.7  CL 102  < > 103  CO2 25  < > 28  GLUCOSE 172*  < > 128*  BUN 18  < > 13  CREATININE 0.81  < > 0.61  CALCIUM 9.2  < > 8.4*  AST 25  --   --   ALT 14  --   --   ALKPHOS 69  --   --   BILITOT 0.5  --   --   < > = values in this interval not displayed. RADIOLOGY:  No results found. ASSESSMENT AND PLAN:   82 year old Caucasian female history of osteoporosis presenting after mechanical fall  1. Right intertrochanteric fracture: s/p ORIF POD2. Pain control/bowel regiment, PT and DVT prophylaxis. Hb is stable.  2. Type 2 diabetes non-insulin-requiring: Hold oral agents, on sliding coverage 3. Essential hypertension: resume lisinopril,  discontinue NS iv. 4. Essential tremor: resume Propranolol 5. Venous thrombi embolism prophylactic: lovenox.  All the records are reviewed and case discussed with Care Management/Social Worker. Management plans discussed with the patient, her daughter and other FM and they are in agreement.  CODE STATUS: Full code  TOTAL TIME TAKING CARE OF THIS PATIENT: 35 minutes.   More than 50% of the time was spent in counseling/coordination  of care: YES  POSSIBLE D/C to SNF IN 1 DAYS, DEPENDING ON CLINICAL CONDITION.   Demetrios Loll M.D on 04/02/2016 at 12:57 PM  Between 7am to 6pm - Pager - [phone]  After 6pm go to [email or web address] - password [email or web address] [facility]  [facility]  Office  [phone]  CC: Primary care physician; Cyndi Bender, PA-C  Note: This dictation was prepared with Dragon  dictation along with smaller phrase technology. Any transcriptional errors that result from this process are unintentional.

## 2016-04-02 NOTE — Discharge Instructions (Signed)
Diet: As you were doing prior to hospitalization   Shower:  May shower but keep the wounds dry, use an occlusive plastic wrap, NO SOAKING IN TUB.  If the bandage gets wet, change with a clean dry gauze.  Dressing:  You may change your dressing as needed. Change the dressing with sterile gauze dressing. Remove staples and apply steri strips on 04/14/16    Activity:  Increase activity slowly as tolerated, but follow the weight bearing instructions below.  No lifting or driving for 6 weeks.  Weight Bearing:   Weight bearing as tolerated to right lower extremity  To prevent constipation: you may use a stool softener such as -  Colace (over the counter) 100 mg by mouth twice a day  Drink plenty of fluids (prune juice may be helpful) and high fiber foods Miralax (over the counter) for constipation as needed.    Itching:  If you experience itching with your medications, try taking only a single pain pill, or even half a pain pill at a time.  You may take up to 10 pain pills per day, and you can also use benadryl over the counter for itching or also to help with sleep.   Precautions:  If you experience chest pain or shortness of breath - call 911 immediately for transfer to the hospital emergency department!!  If you develop a fever greater that 101 F, purulent drainage from wound, increased redness or drainage from wound, or calf pain-Call Paulina                                              Follow- Up Appointment:  Please call for an appointment to be seen in 6 weeks at [facility]

## 2016-04-03 ENCOUNTER — Encounter: Payer: Self-pay | Admitting: Orthopedic Surgery

## 2016-04-03 DIAGNOSIS — D62 Acute posthemorrhagic anemia: Secondary | ICD-10-CM | POA: Diagnosis not present

## 2016-04-03 DIAGNOSIS — F0390 Unspecified dementia without behavioral disturbance: Secondary | ICD-10-CM | POA: Diagnosis not present

## 2016-04-03 DIAGNOSIS — M25559 Pain in unspecified hip: Secondary | ICD-10-CM | POA: Diagnosis not present

## 2016-04-03 DIAGNOSIS — M25551 Pain in right hip: Secondary | ICD-10-CM | POA: Diagnosis not present

## 2016-04-03 DIAGNOSIS — D649 Anemia, unspecified: Secondary | ICD-10-CM | POA: Diagnosis not present

## 2016-04-03 DIAGNOSIS — S72141D Displaced intertrochanteric fracture of right femur, subsequent encounter for closed fracture with routine healing: Secondary | ICD-10-CM | POA: Diagnosis not present

## 2016-04-03 DIAGNOSIS — I10 Essential (primary) hypertension: Secondary | ICD-10-CM

## 2016-04-03 DIAGNOSIS — Z7401 Bed confinement status: Secondary | ICD-10-CM | POA: Diagnosis not present

## 2016-04-03 DIAGNOSIS — S72141A Displaced intertrochanteric fracture of right femur, initial encounter for closed fracture: Secondary | ICD-10-CM | POA: Diagnosis not present

## 2016-04-03 DIAGNOSIS — Z8781 Personal history of (healed) traumatic fracture: Secondary | ICD-10-CM

## 2016-04-03 DIAGNOSIS — Z9889 Other specified postprocedural states: Secondary | ICD-10-CM

## 2016-04-03 DIAGNOSIS — F339 Major depressive disorder, recurrent, unspecified: Secondary | ICD-10-CM | POA: Diagnosis not present

## 2016-04-03 DIAGNOSIS — G250 Essential tremor: Secondary | ICD-10-CM | POA: Diagnosis not present

## 2016-04-03 DIAGNOSIS — E1140 Type 2 diabetes mellitus with diabetic neuropathy, unspecified: Secondary | ICD-10-CM | POA: Diagnosis not present

## 2016-04-03 DIAGNOSIS — G4700 Insomnia, unspecified: Secondary | ICD-10-CM | POA: Diagnosis not present

## 2016-04-03 DIAGNOSIS — F419 Anxiety disorder, unspecified: Secondary | ICD-10-CM | POA: Diagnosis not present

## 2016-04-03 DIAGNOSIS — M810 Age-related osteoporosis without current pathological fracture: Secondary | ICD-10-CM | POA: Diagnosis not present

## 2016-04-03 DIAGNOSIS — W19XXXD Unspecified fall, subsequent encounter: Secondary | ICD-10-CM | POA: Diagnosis not present

## 2016-04-03 DIAGNOSIS — M25552 Pain in left hip: Secondary | ICD-10-CM | POA: Diagnosis not present

## 2016-04-03 DIAGNOSIS — F329 Major depressive disorder, single episode, unspecified: Secondary | ICD-10-CM | POA: Diagnosis not present

## 2016-04-03 DIAGNOSIS — IMO0001 Reserved for inherently not codable concepts without codable children: Secondary | ICD-10-CM

## 2016-04-03 DIAGNOSIS — E119 Type 2 diabetes mellitus without complications: Secondary | ICD-10-CM | POA: Diagnosis not present

## 2016-04-03 HISTORY — DX: Acute posthemorrhagic anemia: D62

## 2016-04-03 LAB — GLUCOSE, CAPILLARY
GLUCOSE-CAPILLARY: 130 mg/dL — AB (ref 65–99)
Glucose-Capillary: 104 mg/dL — ABNORMAL HIGH (ref 65–99)

## 2016-04-03 MED ORDER — SENNOSIDES-DOCUSATE SODIUM 8.6-50 MG PO TABS: 1.0000 | ORAL_TABLET | Freq: Every day | ORAL | 6 refills | Status: DC

## 2016-04-03 MED ORDER — FERROUS SULFATE 325 (65 FE) MG PO TABS: 325.0000 mg | ORAL_TABLET | Freq: Two times a day (BID) | ORAL | 3 refills | Status: DC

## 2016-04-03 MED ORDER — SENNOSIDES-DOCUSATE SODIUM 8.6-50 MG PO TABS: 1.0000 | ORAL_TABLET | Freq: Two times a day (BID) | ORAL | 5 refills | Status: DC

## 2016-04-03 MED ORDER — PROPRANOLOL HCL ER 80 MG PO CP24: 80.0000 mg | ORAL_CAPSULE | Freq: Every day | ORAL | 5 refills | Status: DC

## 2016-04-03 MED ORDER — DOCUSATE SODIUM 100 MG PO CAPS: 100.0000 mg | ORAL_CAPSULE | Freq: Two times a day (BID) | ORAL | 0 refills | Status: DC

## 2016-04-03 MED ORDER — ATORVASTATIN CALCIUM 10 MG PO TABS: 10.0000 mg | ORAL_TABLET | Freq: Every day | ORAL | 5 refills | Status: DC

## 2016-04-03 MED ORDER — LACTULOSE 10 GM/15ML PO SOLN: 10.0000 g | Freq: Two times a day (BID) | ORAL | Status: DC | PRN

## 2016-04-03 MED ORDER — BACLOFEN 10 MG PO TABS: 10.0000 mg | ORAL_TABLET | Freq: Three times a day (TID) | ORAL | 0 refills | Status: DC | PRN

## 2016-04-03 MED ORDER — PANTOPRAZOLE SODIUM 40 MG PO TBEC: 40.0000 mg | DELAYED_RELEASE_TABLET | Freq: Two times a day (BID) | ORAL | 6 refills | Status: DC

## 2016-04-03 MED ORDER — DOCUSATE SODIUM 100 MG PO CAPS
100.0000 mg | ORAL_CAPSULE | Freq: Two times a day (BID) | ORAL | Status: DC
  Administered 2016-04-03: 100 mg via ORAL

## 2016-04-03 NOTE — Clinical Social Work Placement (Signed)
   CLINICAL SOCIAL WORK PLACEMENT  NOTE  Date:  04/03/2016  Patient Details  Name: Carrie Bishop MRN: [id number] Date of Birth: [birth year]  Clinical Social Work is seeking post-discharge placement for this patient at the [place] level of care (*CSW will initial, date and re-position this form in  chart as items are completed):  Yes   Patient/family provided with [place] Work Department's list of facilities offering this level of care within the geographic area requested by the patient (or if unable, by the patient's family).  Yes   Patient/family informed of their freedom to choose among providers that offer the needed level of care, that participate in Medicare, Medicaid or managed care program needed by the patient, have an available bed and are willing to accept the patient.  Yes   Patient/family informed of [facility]'s ownership interest in [facility] and [facility], as well as of the fact that they are under no obligation to receive care at these facilities.  PASRR submitted to EDS on       PASRR number received on       Existing PASRR number confirmed on 04/02/16     FL2 transmitted to all facilities in geographic area requested by pt/family on 04/01/16     FL2 transmitted to all facilities within larger geographic area on       Patient informed that his/her managed care company has contracts with or will negotiate with certain facilities, including the following:        Yes   Patient/family informed of bed offers received.  Patient chooses bed at  [facility] )     Physician recommends and patient chooses bed at      Patient to be transferred to  [facility]) on 04/03/16.  Patient to be transferred to facility by  [facility] )     Patient family notified on 04/03/16 of transfer.  Name of family member notified:   (Patient's daughter [name] is aware of D/C today. )     PHYSICIAN        Additional Comment:    _______________________________________________ Maisen Schmit, Veronia Beets, LCSW 04/03/2016, 12:22 PM

## 2016-04-03 NOTE — Progress Notes (Signed)
Physical Therapy Treatment Patient Details Name: Carrie Bishop MRN: [id number] DOB: 12-Aug-1928 Today's Date: 04/03/2016    History of Present Illness Pt. is an 82 y.o. female who sustained a Right Hip fracture s/p IM nailing.    PT Comments    Pt agreeable to PT. Pt reports 9/10 pain in R hip/thigh. Initially difficulty moving with exercises, but with increased cueing and assist, pt able to demonstrate good range on the right; no issues on the left. Pt becomes tearful several times throughout session due to receiving "bad news" this morning; pt does not elaborate, but pt consoled and able to continue with exercises. Pt does not wish to get up to chair this morning due to being upset. Pt now with discharge order to skilled nursing facility. Pt to continue PT for progression of range, strength and balance to improve all functional mobility in rehab post discharge from inpatient stay.   Follow Up Recommendations  SNF     Equipment Recommendations  Rolling walker with 5" wheels    Recommendations for Other Services       Precautions / Restrictions Precautions Precautions: Fall Restrictions Weight Bearing Restrictions: Yes RLE Weight Bearing: Weight bearing as tolerated    Mobility  Bed Mobility               General bed mobility comments: Not tested; pt did not wish out of bed; pt upset noting she received "bad news" this morning   Transfers                    Ambulation/Gait                 Stairs            Wheelchair Mobility    Modified Rankin (Stroke Patients Only)       Balance                                    Cognition Arousal/Alertness: Awake/alert Behavior During Therapy: WFL for tasks assessed/performed Overall Cognitive Status: No family/caregiver present to determine baseline cognitive functioning       Memory: Decreased recall of precautions              Exercises General Exercises - Lower  Extremity Ankle Circles/Pumps: AAROM;Both;20 reps;Supine Quad Sets: Strengthening;Both;20 reps;Supine Gluteal Sets: Strengthening;Both;20 reps;Supine Short Arc Quad: AAROM;Right;20 reps;Supine (L AROM) Heel Slides: AAROM;Right;20 reps;Supine Hip ABduction/ADduction: AAROM;Right;20 reps;Supine Straight Leg Raises: AAROM;Right;10 reps;Supine Other Exercises Other Exercises: B hip add'r squeeze 20x supine    General Comments        Pertinent Vitals/Pain Pain Assessment: 0-10 Pain Score: 0-No pain Pain Location: R hip/thigh Pain Descriptors / Indicators: Constant Pain Intervention(s): Limited activity within patient's tolerance;Monitored during session;Premedicated before session    Home Living Family/patient expects to be discharged to:: Private residence Living Arrangements: Alone Available Help at Discharge: Family Type of Home: Mobile home Home Access: Ramped entrance   Home Layout: One level Home Equipment: Tub bench      Prior Function Level of Independence: Independent with assistive device(s)          PT Goals (current goals can now be found in the care plan section) Acute Rehab PT Goals Patient Stated Goal: To regain independence Progress towards PT goals: Progressing toward goals (slowly)    Frequency  BID    PT Plan Current plan remains appropriate;Frequency needs to be updated;Discharge  plan needs to be updated    Co-evaluation             End of Session   Activity Tolerance: Patient limited by pain;Other (comment) (limited by tearful bouts) Patient left: in bed;with call bell/phone within reach;with bed alarm set;with SCD's reapplied     Time: [id number] PT Time Calculation (min) (ACUTE ONLY): 29 min  Charges:  $Therapeutic Exercise: 23-37 mins                    G Codes:      Charlaine Dalton, PTA 04/03/2016, 10:15 AM

## 2016-04-03 NOTE — Progress Notes (Signed)
Patient is being discharged to [facility]. IV removed with cath intact. Report called. Family aware. Waiting for EMS at this time. Last pain med was given at 1230. Dressing to right hip is intact.

## 2016-04-03 NOTE — Discharge Summary (Signed)
[facility] [place] NAME: Carrie Bishop    MR#:  [id number]  DATE OF BIRTH:  March 13, 1929  DATE OF ADMISSION:  03/30/2016 ADMITTING PHYSICIAN: Dereck Leep, MD  DATE OF DISCHARGE: No discharge date for patient encounter.  PRIMARY CARE PHYSICIAN: Cyndi Bender, PA-C     ADMISSION DIAGNOSIS:  Leg Pain hip fracture  DISCHARGE DIAGNOSIS:  Principal Problem:   Closed intertrochanteric fracture of femur (HCC) Active Problems:   Closed intratrochanteric fracture of right femur (HCC)   S/P ORIF (open reduction internal fixation) fracture   Acute posthemorrhagic anemia   Hyponatremia   Essential hypertension   Essential tremor   Diabetes ([place])   SECONDARY DIAGNOSIS:   Past Medical History:  Diagnosis Date  . Depression   . Diabetes mellitus without complication ([place])   . Hip fracture [facility])    left March 2017  . Hypertension   . Neuropathy ([place])   . Osteoporosis   . Restless leg syndrome   . Stroke ([place])   . Tremor     .pro HOSPITAL COURSE:  [name]  is a 82 y.o. female with a known history of Osteoporosis, type 2 diabetes not requiring who is presenting after mechanical fall. She was using her walker at [facility] and fell. Suffering immediate pain in right hip described only as "pain" intensity 10/10 worsened with movement, no relieving factors nonradiating found to have right-sided intertrochanteric fracture.  Discussion by problem: 82 year old Caucasian female history of osteoporosis presenting after mechanical fall.  1. Right intertrochanteric femur fracture: Patient underwent ORIF procedure09/08/2015 by Dr. Marry Guan. Postoperatively did well, participated in physical therapy, recommended to go to skilled nursing facility for rehabilitation.  2. Type 2 diabetes non-insulin-requiring: Hemoglobin A1c was 5.9, oral intake was still poor, decision was made to recommend to hold oral agents , watch patient's oral intake,  and resume metformin if oral intake improves. May initiate sliding scale insulin while in the facility.  3. Essential hypertension: Resume lisinopril 4. Essential tremor: Propranolol 5. Acute posthemorrhagic anemia, initiated on an supplementation as outpatient DISCHARGE CONDITIONS:   Stable  CONSULTS OBTAINED:  Treatment Team:  Lytle Butte, MD Dereck Leep, MD  DRUG ALLERGIES:  No Known Allergies  DISCHARGE MEDICATIONS:   Current Discharge Medication List    START taking these medications   Details  docusate sodium (COLACE) 100 MG capsule Take 1 capsule (100 mg total) by mouth 2 (two) times daily. Qty: 10 capsule, Refills: 0    enoxaparin (LOVENOX) 40 MG/0.4ML injection Inject 0.4 mLs (40 mg total) into the skin daily. Qty: 14 Syringe, Refills: 0    ferrous sulfate 325 (65 FE) MG tablet Take 1 tablet (325 mg total) by mouth 2 (two) times daily with a meal. Qty: 60 tablet, Refills: 3    oxyCODONE (OXY IR/ROXICODONE) 5 MG immediate release tablet Take 1-2 tablets (5-10 mg total) by mouth every 4 (four) hours as needed for breakthrough pain ((for MODERATE breakthrough pain)). Qty: 30 tablet, Refills: 0    pantoprazole (PROTONIX) 40 MG tablet Take 1 tablet (40 mg total) by mouth 2 (two) times daily. Qty: 60 tablet, Refills: 6    propranolol ER (INDERAL LA) 80 MG 24 hr capsule Take 1 capsule (80 mg total) by mouth daily. Qty: 30 capsule, Refills: 5    senna-docusate (SENOKOT-S) 8.6-50 MG tablet Take 1 tablet by mouth 2 (two) times daily. Qty: 60 tablet, Refills: 5      CONTINUE these medications  which have CHANGED   Details  atorvastatin (LIPITOR) 10 MG tablet Take 1 tablet (10 mg total) by mouth at bedtime. Qty: 30 tablet, Refills: 5    baclofen (LIORESAL) 10 MG tablet Take 1 tablet (10 mg total) by mouth every 8 (eight) hours as needed for muscle spasms. Qty: 30 each, Refills: 0    traMADol (ULTRAM) 50 MG tablet Take 1-2 tablets (50-100 mg total) by mouth every 4  (four) hours as needed for moderate pain. Qty: 30 tablet, Refills: 0      CONTINUE these medications which have NOT CHANGED   Details  cholecalciferol (VITAMIN D) 1000 units tablet Take 1,000 Units by mouth daily.    DULoxetine (CYMBALTA) 60 MG capsule Take 60 mg by mouth daily.     gabapentin (NEURONTIN) 600 MG tablet Take 600 mg by mouth 3 (three) times daily.    lisinopril (PRINIVIL,ZESTRIL) 40 MG tablet Take 1 tablet by mouth at bedtime.     primidone (MYSOLINE) 50 MG tablet Take 100 mg by mouth at bedtime.     traZODone (DESYREL) 100 MG tablet Take 100-200 mg by mouth at bedtime as needed for sleep.    alendronate (FOSAMAX) 70 MG tablet Take 70 mg by mouth once a week. Take with a full glass of water on an empty stomach. On Fridays.      STOP taking these medications     metFORMIN (GLUCOPHAGE) 500 MG tablet      pravastatin (PRAVACHOL) 40 MG tablet          DISCHARGE INSTRUCTIONS:    Patient is to follow-up with primary care physician, orthopedist surgeon, Dr. Marry Guan within 1-2 weeks after discharge  If you experience worsening of your admission symptoms, develop shortness of breath, life threatening emergency, suicidal or homicidal thoughts you must seek medical attention immediately by calling 911 or calling your MD immediately  if symptoms less severe.  You Must read complete instructions/literature along with all the possible adverse reactions/side effects for all the Medicines you take and that have been prescribed to you. Take any new Medicines after you have completely understood and accept all the possible adverse reactions/side effects.   Please note  You were cared for by a hospitalist during your hospital stay. If you have any questions about your discharge medications or the care you received while you were in the hospital after you are discharged, you can call the unit and asked to speak with the hospitalist on call if the hospitalist that took care of you  is not available. Once you are discharged, your primary care physician will handle any further medical issues. Please note that NO REFILLS for any discharge medications will be authorized once you are discharged, as it is imperative that you return to your primary care physician (or establish a relationship with a primary care physician if you do not have one) for your aftercare needs so that they can reassess your need for medications and monitor your lab values.    Today   CHIEF COMPLAINT:   Chief Complaint  Patient presents with  . Leg Pain    HISTORY OF PRESENT ILLNESS:  Arleane Spinale  is a 82 y.o. female with a known history of Osteoporosis, type 2 diabetes not requiring who is presenting after mechanical fall. She was using her walker at [facility] and fell. Suffering immediate pain in right hip described only as "pain" intensity 10/10 worsened with movement, no relieving factors nonradiating found to have right-sided intertrochanteric fracture.  Discussion  by problem: 82 year old Caucasian female history of osteoporosis presenting after mechanical fall.  1. Right intertrochanteric femur fracture: Patient underwent ORIF procedure09/08/2015 by Dr. Marry Guan. Postoperatively did well, participated in physical therapy, recommended to go to skilled nursing facility for rehabilitation.  2. Type 2 diabetes non-insulin-requiring: Hemoglobin A1c was 5.9, oral intake was still poor, decision was made to recommend to hold oral agents , watch patient's oral intake, and resume metformin if oral intake improves. May initiate sliding scale insulin while in the facility.  3. Essential hypertension: Resume lisinopril 4. Essential tremor: Propranolol 5. Acute posthemorrhagic anemia, initiated on an supplementation as outpatient    VITAL SIGNS:  Blood pressure (!) 165/64, pulse 63, temperature 98.2 F (36.8 C), temperature source Oral, resp. rate 18, height 5\' 6"  (1.676 m), weight 53.1 kg (117 lb), SpO2  98 %.  I/O:   Intake/Output Summary (Last 24 hours) at 04/03/16 0828 Last data filed at 04/03/16 0610  Gross per 24 hour  Intake              600 ml  Output                0 ml  Net              600 ml    PHYSICAL EXAMINATION:  GENERAL:  82 y.o.-year-old patient lying in the bed with no acute distress.  EYES: Pupils equal, round, reactive to light and accommodation. No scleral icterus. Extraocular muscles intact.  HEENT: Head atraumatic, normocephalic. Oropharynx and nasopharynx clear.  NECK:  Supple, no jugular venous distention. No thyroid enlargement, no tenderness.  LUNGS: Normal breath sounds bilaterally, no wheezing, rales,rhonchi or crepitation. No use of accessory muscles of respiration.  CARDIOVASCULAR: S1, S2 normal. No murmurs, rubs, or gallops.  ABDOMEN: Soft, non-tender, non-distended. Bowel sounds present. No organomegaly or mass.  EXTREMITIES: No pedal edema, cyanosis, or clubbing.  NEUROLOGIC: Cranial nerves II through XII are intact. Muscle strength 5/5 in all extremities. Sensation intact. Gait not checked.  PSYCHIATRIC: The patient is alert and oriented x 3.  SKIN: No obvious rash, lesion, or ulcer.   DATA REVIEW:   CBC  Recent Labs Lab 04/02/16 0349  WBC 5.1  HGB 9.3*  HCT 26.5*  PLT 151    Chemistries   Recent Labs Lab 03/30/16 1503  04/02/16 0349  NA 135  < > 136  K 4.4  < > 3.7  CL 102  < > 103  CO2 25  < > 28  GLUCOSE 172*  < > 128*  BUN 18  < > 13  CREATININE 0.81  < > 0.61  CALCIUM 9.2  < > 8.4*  AST 25  --   --   ALT 14  --   --   ALKPHOS 69  --   --   BILITOT 0.5  --   --   < > = values in this interval not displayed.  Cardiac Enzymes No results for input(s): TROPONINI in the last 168 hours.  Microbiology Results  Results for orders placed or performed during the hospital encounter of 03/30/16  Surgical PCR screen     Status: Abnormal   Collection Time: 03/30/16  6:12 PM  Result Value Ref Range Status   MRSA, PCR NEGATIVE  NEGATIVE Final   Staphylococcus aureus POSITIVE (A) NEGATIVE Final    Comment:        The Xpert SA Assay (FDA approved for NASAL specimens in patients over 58 years of age), is  one component of a comprehensive surveillance program.  Test performance has been validated by [facility] for patients greater than or equal to 88 year old. It is not intended to diagnose infection nor to guide or monitor treatment.     RADIOLOGY:  No results found.  EKG:   Orders placed or performed during the hospital encounter of 10/20/15  . ED EKG  . ED EKG  . EKG 12-Lead  . EKG 12-Lead      Management plans discussed with the patient, family and they are in agreement.  CODE STATUS:     Code Status Orders        Start     Ordered   03/30/16 1654  Full code  Continuous     03/30/16 1654    Code Status History    Date Active Date Inactive Code Status Order ID Comments User Context   10/20/2015  8:20 PM 10/24/2015  7:09 PM Full Code [id number]  Theodoro Grist, MD Inpatient      TOTAL TIME TAKING CARE OF THIS PATIENT: 40 minutes.    Theodoro Grist M.D on 04/03/2016 at 8:28 AM  Between 7am to 6pm - Pager - [phone]  After 6pm go to [email or web address] - password [email or web address] [place] [facility]  Office  [phone]  CC: Primary care physician; Cyndi Bender, PA-C

## 2016-04-03 NOTE — Progress Notes (Signed)
Occupational Therapy Treatment Patient Details Name: Carrie Bishop MRN: [id number] DOB: 02/17/1929 Today's Date: 04/03/2016    History of present illness Pt. is an 82 y.o. female who sustained a Right Hip fracture s/p IM nailing.   OT comments  Pt. Continues to present with limited strength, weakness, and decreased functional mobility which continue to hinder her ability to complete ADL tasks. Pt. Education was provided about general reacher use, and A/E use for LE dressing. Pt. Requires verbal cues for proper technique, and for reinforcement. Pt. Reports feeling depressed this morning about family issues. Pt. Plans continues to plan to go to SNF upon discharge, with follow-up OT services.    Follow Up Recommendations  SNF    Equipment Recommendations       Recommendations for Other Services PT consult    Precautions / Restrictions Precautions Precautions: Fall Restrictions Weight Bearing Restrictions: Yes RLE Weight Bearing: Weight bearing as tolerated                                                                            ADL Overall ADL's : Needs assistance/impaired Eating/Feeding: Set up   Grooming: Set up               Lower Body Dressing: Moderate assistance                 General ADL Comments: Pt. education was provided about A/E use for LE dressing, and general reacher use.      Vision                     Perception     Praxis      Cognition   Behavior During Therapy: WFL for tasks assessed/performed Overall Cognitive Status: No family/caregiver present to determine baseline cognitive functioning       Memory: Decreased recall of precautions               Extremity/Trunk Assessment               Exercises     Shoulder Instructions       General Comments      Pertinent Vitals/ Pain       Pain Assessment: 0-10 Pain Score: 0-No pain Pain Location: (P) R hip/thigh Pain  Descriptors / Indicators: (P) Constant Pain Intervention(s): (P) Limited activity within patient's tolerance;Monitored during session;Premedicated before session  Home Living Family/patient expects to be discharged to:: Private residence Living Arrangements: Alone Available Help at Discharge: Family Type of Home: Mobile home Home Access: Ramped entrance     Home Layout: One level     Bathroom Shower/Tub: Tub/shower unit Shower/tub characteristics: Architectural technologist: Standard Bathroom Accessibility: Yes   Home Equipment: Tub bench          Prior Functioning/Environment Level of Independence: Independent with assistive device(s)            Frequency Min 2X/week     Progress Toward Goals  OT Goals(current goals can now be found in the care plan section)  Progress towards OT goals: Progressing toward goals  Acute Rehab OT Goals Patient Stated Goal: To regain independence OT Goal Formulation: With patient Potential to Achieve Goals: Good  Plan      Co-evaluation                 End of Session     Activity Tolerance Patient tolerated treatment well   Patient Left with call bell/phone within reach;in bed;with bed alarm set   Nurse Communication          Time: [id number] OT Time Calculation (min): 23 min  Charges: OT General Charges $OT Visit: 1 Procedure OT Treatments $Self Care/Home Management : 23-37 mins  Harrel Carina, MS, OTR/L 04/03/2016, 10:11 AM

## 2016-04-03 NOTE — Progress Notes (Signed)
Patient is medically stable for D/C to [facility] today. Per [facility] admissions coordinator at [facility] patient will go to room 401. RN will call report to 400 hall RN and arrange EMS for transport. [facility] authorization has been received. Auth # [id number]. Clinical Education officer, museum (CSW) sent D/C orders to [employer] via [employer]. Patient is aware of above. CSW contacted patient's daughter [name] and made her aware of above. Please reconsult if future social work needs arise. CSW signing off.   [employer], LCSW [phone]

## 2016-04-05 DIAGNOSIS — M810 Age-related osteoporosis without current pathological fracture: Secondary | ICD-10-CM | POA: Diagnosis not present

## 2016-04-05 DIAGNOSIS — E119 Type 2 diabetes mellitus without complications: Secondary | ICD-10-CM | POA: Diagnosis not present

## 2016-04-05 DIAGNOSIS — I10 Essential (primary) hypertension: Secondary | ICD-10-CM | POA: Diagnosis not present

## 2016-04-20 DIAGNOSIS — M25551 Pain in right hip: Secondary | ICD-10-CM | POA: Diagnosis not present

## 2016-05-05 DIAGNOSIS — M80051D Age-related osteoporosis with current pathological fracture, right femur, subsequent encounter for fracture with routine healing: Secondary | ICD-10-CM | POA: Diagnosis not present

## 2016-05-05 DIAGNOSIS — E1142 Type 2 diabetes mellitus with diabetic polyneuropathy: Secondary | ICD-10-CM | POA: Diagnosis not present

## 2016-05-05 DIAGNOSIS — G4700 Insomnia, unspecified: Secondary | ICD-10-CM | POA: Diagnosis not present

## 2016-05-05 DIAGNOSIS — E1122 Type 2 diabetes mellitus with diabetic chronic kidney disease: Secondary | ICD-10-CM | POA: Diagnosis not present

## 2016-05-05 DIAGNOSIS — G2581 Restless legs syndrome: Secondary | ICD-10-CM | POA: Diagnosis not present

## 2016-05-05 DIAGNOSIS — G250 Essential tremor: Secondary | ICD-10-CM | POA: Diagnosis not present

## 2016-05-05 DIAGNOSIS — I129 Hypertensive chronic kidney disease with stage 1 through stage 4 chronic kidney disease, or unspecified chronic kidney disease: Secondary | ICD-10-CM | POA: Diagnosis not present

## 2016-05-05 DIAGNOSIS — F329 Major depressive disorder, single episode, unspecified: Secondary | ICD-10-CM | POA: Diagnosis not present

## 2016-05-05 DIAGNOSIS — N189 Chronic kidney disease, unspecified: Secondary | ICD-10-CM | POA: Diagnosis not present

## 2016-05-07 DIAGNOSIS — E1142 Type 2 diabetes mellitus with diabetic polyneuropathy: Secondary | ICD-10-CM | POA: Diagnosis not present

## 2016-05-07 DIAGNOSIS — G4700 Insomnia, unspecified: Secondary | ICD-10-CM | POA: Diagnosis not present

## 2016-05-07 DIAGNOSIS — G2581 Restless legs syndrome: Secondary | ICD-10-CM | POA: Diagnosis not present

## 2016-05-07 DIAGNOSIS — E1122 Type 2 diabetes mellitus with diabetic chronic kidney disease: Secondary | ICD-10-CM | POA: Diagnosis not present

## 2016-05-07 DIAGNOSIS — F329 Major depressive disorder, single episode, unspecified: Secondary | ICD-10-CM | POA: Diagnosis not present

## 2016-05-07 DIAGNOSIS — N189 Chronic kidney disease, unspecified: Secondary | ICD-10-CM | POA: Diagnosis not present

## 2016-05-07 DIAGNOSIS — M80051D Age-related osteoporosis with current pathological fracture, right femur, subsequent encounter for fracture with routine healing: Secondary | ICD-10-CM | POA: Diagnosis not present

## 2016-05-07 DIAGNOSIS — I129 Hypertensive chronic kidney disease with stage 1 through stage 4 chronic kidney disease, or unspecified chronic kidney disease: Secondary | ICD-10-CM | POA: Diagnosis not present

## 2016-05-07 DIAGNOSIS — G250 Essential tremor: Secondary | ICD-10-CM | POA: Diagnosis not present

## 2016-05-08 DIAGNOSIS — G250 Essential tremor: Secondary | ICD-10-CM | POA: Diagnosis not present

## 2016-05-08 DIAGNOSIS — E1142 Type 2 diabetes mellitus with diabetic polyneuropathy: Secondary | ICD-10-CM | POA: Diagnosis not present

## 2016-05-08 DIAGNOSIS — G4700 Insomnia, unspecified: Secondary | ICD-10-CM | POA: Diagnosis not present

## 2016-05-08 DIAGNOSIS — G2581 Restless legs syndrome: Secondary | ICD-10-CM | POA: Diagnosis not present

## 2016-05-08 DIAGNOSIS — M80051D Age-related osteoporosis with current pathological fracture, right femur, subsequent encounter for fracture with routine healing: Secondary | ICD-10-CM | POA: Diagnosis not present

## 2016-05-08 DIAGNOSIS — I129 Hypertensive chronic kidney disease with stage 1 through stage 4 chronic kidney disease, or unspecified chronic kidney disease: Secondary | ICD-10-CM | POA: Diagnosis not present

## 2016-05-08 DIAGNOSIS — F329 Major depressive disorder, single episode, unspecified: Secondary | ICD-10-CM | POA: Diagnosis not present

## 2016-05-08 DIAGNOSIS — E1122 Type 2 diabetes mellitus with diabetic chronic kidney disease: Secondary | ICD-10-CM | POA: Diagnosis not present

## 2016-05-08 DIAGNOSIS — N189 Chronic kidney disease, unspecified: Secondary | ICD-10-CM | POA: Diagnosis not present

## 2016-05-11 DIAGNOSIS — H25013 Cortical age-related cataract, bilateral: Secondary | ICD-10-CM | POA: Diagnosis not present

## 2016-05-11 DIAGNOSIS — H2512 Age-related nuclear cataract, left eye: Secondary | ICD-10-CM | POA: Diagnosis not present

## 2016-05-11 DIAGNOSIS — H2513 Age-related nuclear cataract, bilateral: Secondary | ICD-10-CM | POA: Diagnosis not present

## 2016-05-16 DIAGNOSIS — I129 Hypertensive chronic kidney disease with stage 1 through stage 4 chronic kidney disease, or unspecified chronic kidney disease: Secondary | ICD-10-CM | POA: Diagnosis not present

## 2016-05-16 DIAGNOSIS — F329 Major depressive disorder, single episode, unspecified: Secondary | ICD-10-CM | POA: Diagnosis not present

## 2016-05-16 DIAGNOSIS — S72009A Fracture of unspecified part of neck of unspecified femur, initial encounter for closed fracture: Secondary | ICD-10-CM | POA: Diagnosis not present

## 2016-05-16 DIAGNOSIS — E7800 Pure hypercholesterolemia, unspecified: Secondary | ICD-10-CM | POA: Diagnosis not present

## 2016-05-16 DIAGNOSIS — G4700 Insomnia, unspecified: Secondary | ICD-10-CM | POA: Diagnosis not present

## 2016-05-16 DIAGNOSIS — Z23 Encounter for immunization: Secondary | ICD-10-CM | POA: Diagnosis not present

## 2016-05-16 DIAGNOSIS — I69359 Hemiplegia and hemiparesis following cerebral infarction affecting unspecified side: Secondary | ICD-10-CM | POA: Diagnosis not present

## 2016-05-16 DIAGNOSIS — G2581 Restless legs syndrome: Secondary | ICD-10-CM | POA: Diagnosis not present

## 2016-05-16 DIAGNOSIS — Z79899 Other long term (current) drug therapy: Secondary | ICD-10-CM | POA: Diagnosis not present

## 2016-05-16 DIAGNOSIS — M80051D Age-related osteoporosis with current pathological fracture, right femur, subsequent encounter for fracture with routine healing: Secondary | ICD-10-CM | POA: Diagnosis not present

## 2016-05-16 DIAGNOSIS — E119 Type 2 diabetes mellitus without complications: Secondary | ICD-10-CM | POA: Diagnosis not present

## 2016-05-16 DIAGNOSIS — G250 Essential tremor: Secondary | ICD-10-CM | POA: Diagnosis not present

## 2016-05-16 DIAGNOSIS — R413 Other amnesia: Secondary | ICD-10-CM | POA: Diagnosis not present

## 2016-05-16 DIAGNOSIS — E1142 Type 2 diabetes mellitus with diabetic polyneuropathy: Secondary | ICD-10-CM | POA: Diagnosis not present

## 2016-05-16 DIAGNOSIS — I10 Essential (primary) hypertension: Secondary | ICD-10-CM | POA: Diagnosis not present

## 2016-05-16 DIAGNOSIS — E1122 Type 2 diabetes mellitus with diabetic chronic kidney disease: Secondary | ICD-10-CM | POA: Diagnosis not present

## 2016-05-16 DIAGNOSIS — N189 Chronic kidney disease, unspecified: Secondary | ICD-10-CM | POA: Diagnosis not present

## 2016-05-17 DIAGNOSIS — G4700 Insomnia, unspecified: Secondary | ICD-10-CM | POA: Diagnosis not present

## 2016-05-17 DIAGNOSIS — E1142 Type 2 diabetes mellitus with diabetic polyneuropathy: Secondary | ICD-10-CM | POA: Diagnosis not present

## 2016-05-17 DIAGNOSIS — I129 Hypertensive chronic kidney disease with stage 1 through stage 4 chronic kidney disease, or unspecified chronic kidney disease: Secondary | ICD-10-CM | POA: Diagnosis not present

## 2016-05-17 DIAGNOSIS — M80051D Age-related osteoporosis with current pathological fracture, right femur, subsequent encounter for fracture with routine healing: Secondary | ICD-10-CM | POA: Diagnosis not present

## 2016-05-17 DIAGNOSIS — N189 Chronic kidney disease, unspecified: Secondary | ICD-10-CM | POA: Diagnosis not present

## 2016-05-17 DIAGNOSIS — E1122 Type 2 diabetes mellitus with diabetic chronic kidney disease: Secondary | ICD-10-CM | POA: Diagnosis not present

## 2016-05-17 DIAGNOSIS — G2581 Restless legs syndrome: Secondary | ICD-10-CM | POA: Diagnosis not present

## 2016-05-17 DIAGNOSIS — G250 Essential tremor: Secondary | ICD-10-CM | POA: Diagnosis not present

## 2016-05-17 DIAGNOSIS — F329 Major depressive disorder, single episode, unspecified: Secondary | ICD-10-CM | POA: Diagnosis not present

## 2016-05-18 DIAGNOSIS — N189 Chronic kidney disease, unspecified: Secondary | ICD-10-CM | POA: Diagnosis not present

## 2016-05-18 DIAGNOSIS — E1122 Type 2 diabetes mellitus with diabetic chronic kidney disease: Secondary | ICD-10-CM | POA: Diagnosis not present

## 2016-05-18 DIAGNOSIS — G2581 Restless legs syndrome: Secondary | ICD-10-CM | POA: Diagnosis not present

## 2016-05-18 DIAGNOSIS — I129 Hypertensive chronic kidney disease with stage 1 through stage 4 chronic kidney disease, or unspecified chronic kidney disease: Secondary | ICD-10-CM | POA: Diagnosis not present

## 2016-05-18 DIAGNOSIS — M80051D Age-related osteoporosis with current pathological fracture, right femur, subsequent encounter for fracture with routine healing: Secondary | ICD-10-CM | POA: Diagnosis not present

## 2016-05-18 DIAGNOSIS — G250 Essential tremor: Secondary | ICD-10-CM | POA: Diagnosis not present

## 2016-05-18 DIAGNOSIS — F329 Major depressive disorder, single episode, unspecified: Secondary | ICD-10-CM | POA: Diagnosis not present

## 2016-05-18 DIAGNOSIS — G4700 Insomnia, unspecified: Secondary | ICD-10-CM | POA: Diagnosis not present

## 2016-05-18 DIAGNOSIS — E1142 Type 2 diabetes mellitus with diabetic polyneuropathy: Secondary | ICD-10-CM | POA: Diagnosis not present

## 2016-05-21 DIAGNOSIS — E1142 Type 2 diabetes mellitus with diabetic polyneuropathy: Secondary | ICD-10-CM | POA: Diagnosis not present

## 2016-05-21 DIAGNOSIS — F329 Major depressive disorder, single episode, unspecified: Secondary | ICD-10-CM | POA: Diagnosis not present

## 2016-05-21 DIAGNOSIS — N189 Chronic kidney disease, unspecified: Secondary | ICD-10-CM | POA: Diagnosis not present

## 2016-05-21 DIAGNOSIS — G2581 Restless legs syndrome: Secondary | ICD-10-CM | POA: Diagnosis not present

## 2016-05-21 DIAGNOSIS — E1122 Type 2 diabetes mellitus with diabetic chronic kidney disease: Secondary | ICD-10-CM | POA: Diagnosis not present

## 2016-05-21 DIAGNOSIS — M80051D Age-related osteoporosis with current pathological fracture, right femur, subsequent encounter for fracture with routine healing: Secondary | ICD-10-CM | POA: Diagnosis not present

## 2016-05-21 DIAGNOSIS — G4700 Insomnia, unspecified: Secondary | ICD-10-CM | POA: Diagnosis not present

## 2016-05-21 DIAGNOSIS — G250 Essential tremor: Secondary | ICD-10-CM | POA: Diagnosis not present

## 2016-05-21 DIAGNOSIS — I129 Hypertensive chronic kidney disease with stage 1 through stage 4 chronic kidney disease, or unspecified chronic kidney disease: Secondary | ICD-10-CM | POA: Diagnosis not present

## 2016-05-22 DIAGNOSIS — S72141D Displaced intertrochanteric fracture of right femur, subsequent encounter for closed fracture with routine healing: Secondary | ICD-10-CM | POA: Diagnosis not present

## 2016-05-23 DIAGNOSIS — I129 Hypertensive chronic kidney disease with stage 1 through stage 4 chronic kidney disease, or unspecified chronic kidney disease: Secondary | ICD-10-CM | POA: Diagnosis not present

## 2016-05-23 DIAGNOSIS — E1122 Type 2 diabetes mellitus with diabetic chronic kidney disease: Secondary | ICD-10-CM | POA: Diagnosis not present

## 2016-05-23 DIAGNOSIS — G309 Alzheimer's disease, unspecified: Secondary | ICD-10-CM

## 2016-05-23 DIAGNOSIS — F329 Major depressive disorder, single episode, unspecified: Secondary | ICD-10-CM | POA: Diagnosis not present

## 2016-05-23 DIAGNOSIS — M80051D Age-related osteoporosis with current pathological fracture, right femur, subsequent encounter for fracture with routine healing: Secondary | ICD-10-CM | POA: Diagnosis not present

## 2016-05-23 DIAGNOSIS — F0280 Dementia in other diseases classified elsewhere without behavioral disturbance: Secondary | ICD-10-CM

## 2016-05-23 DIAGNOSIS — E538 Deficiency of other specified B group vitamins: Secondary | ICD-10-CM | POA: Diagnosis not present

## 2016-05-23 DIAGNOSIS — N189 Chronic kidney disease, unspecified: Secondary | ICD-10-CM | POA: Diagnosis not present

## 2016-05-23 DIAGNOSIS — G250 Essential tremor: Secondary | ICD-10-CM | POA: Diagnosis not present

## 2016-05-23 DIAGNOSIS — G4700 Insomnia, unspecified: Secondary | ICD-10-CM | POA: Diagnosis not present

## 2016-05-23 DIAGNOSIS — E1142 Type 2 diabetes mellitus with diabetic polyneuropathy: Secondary | ICD-10-CM | POA: Diagnosis not present

## 2016-05-23 DIAGNOSIS — F0150 Vascular dementia without behavioral disturbance: Secondary | ICD-10-CM | POA: Diagnosis not present

## 2016-05-23 DIAGNOSIS — G2581 Restless legs syndrome: Secondary | ICD-10-CM | POA: Diagnosis not present

## 2016-05-24 DIAGNOSIS — M80051D Age-related osteoporosis with current pathological fracture, right femur, subsequent encounter for fracture with routine healing: Secondary | ICD-10-CM | POA: Diagnosis not present

## 2016-05-24 DIAGNOSIS — F329 Major depressive disorder, single episode, unspecified: Secondary | ICD-10-CM | POA: Diagnosis not present

## 2016-05-24 DIAGNOSIS — E1142 Type 2 diabetes mellitus with diabetic polyneuropathy: Secondary | ICD-10-CM | POA: Diagnosis not present

## 2016-05-24 DIAGNOSIS — G4700 Insomnia, unspecified: Secondary | ICD-10-CM | POA: Diagnosis not present

## 2016-05-24 DIAGNOSIS — G250 Essential tremor: Secondary | ICD-10-CM | POA: Diagnosis not present

## 2016-05-24 DIAGNOSIS — N189 Chronic kidney disease, unspecified: Secondary | ICD-10-CM | POA: Diagnosis not present

## 2016-05-24 DIAGNOSIS — E1122 Type 2 diabetes mellitus with diabetic chronic kidney disease: Secondary | ICD-10-CM | POA: Diagnosis not present

## 2016-05-24 DIAGNOSIS — I129 Hypertensive chronic kidney disease with stage 1 through stage 4 chronic kidney disease, or unspecified chronic kidney disease: Secondary | ICD-10-CM | POA: Diagnosis not present

## 2016-05-24 DIAGNOSIS — G2581 Restless legs syndrome: Secondary | ICD-10-CM | POA: Diagnosis not present

## 2016-05-25 DIAGNOSIS — G2581 Restless legs syndrome: Secondary | ICD-10-CM | POA: Diagnosis not present

## 2016-05-25 DIAGNOSIS — F329 Major depressive disorder, single episode, unspecified: Secondary | ICD-10-CM | POA: Diagnosis not present

## 2016-05-25 DIAGNOSIS — G4700 Insomnia, unspecified: Secondary | ICD-10-CM | POA: Diagnosis not present

## 2016-05-25 DIAGNOSIS — M80051D Age-related osteoporosis with current pathological fracture, right femur, subsequent encounter for fracture with routine healing: Secondary | ICD-10-CM | POA: Diagnosis not present

## 2016-05-25 DIAGNOSIS — I129 Hypertensive chronic kidney disease with stage 1 through stage 4 chronic kidney disease, or unspecified chronic kidney disease: Secondary | ICD-10-CM | POA: Diagnosis not present

## 2016-05-25 DIAGNOSIS — E1142 Type 2 diabetes mellitus with diabetic polyneuropathy: Secondary | ICD-10-CM | POA: Diagnosis not present

## 2016-05-25 DIAGNOSIS — G250 Essential tremor: Secondary | ICD-10-CM | POA: Diagnosis not present

## 2016-05-25 DIAGNOSIS — N189 Chronic kidney disease, unspecified: Secondary | ICD-10-CM | POA: Diagnosis not present

## 2016-05-25 DIAGNOSIS — E1122 Type 2 diabetes mellitus with diabetic chronic kidney disease: Secondary | ICD-10-CM | POA: Diagnosis not present

## 2016-05-28 DIAGNOSIS — F329 Major depressive disorder, single episode, unspecified: Secondary | ICD-10-CM | POA: Diagnosis not present

## 2016-05-28 DIAGNOSIS — G2581 Restless legs syndrome: Secondary | ICD-10-CM | POA: Diagnosis not present

## 2016-05-28 DIAGNOSIS — M80051D Age-related osteoporosis with current pathological fracture, right femur, subsequent encounter for fracture with routine healing: Secondary | ICD-10-CM | POA: Diagnosis not present

## 2016-05-28 DIAGNOSIS — E1122 Type 2 diabetes mellitus with diabetic chronic kidney disease: Secondary | ICD-10-CM | POA: Diagnosis not present

## 2016-05-28 DIAGNOSIS — G4700 Insomnia, unspecified: Secondary | ICD-10-CM | POA: Diagnosis not present

## 2016-05-28 DIAGNOSIS — G250 Essential tremor: Secondary | ICD-10-CM | POA: Diagnosis not present

## 2016-05-28 DIAGNOSIS — E1142 Type 2 diabetes mellitus with diabetic polyneuropathy: Secondary | ICD-10-CM | POA: Diagnosis not present

## 2016-05-28 DIAGNOSIS — I129 Hypertensive chronic kidney disease with stage 1 through stage 4 chronic kidney disease, or unspecified chronic kidney disease: Secondary | ICD-10-CM | POA: Diagnosis not present

## 2016-05-28 DIAGNOSIS — N189 Chronic kidney disease, unspecified: Secondary | ICD-10-CM | POA: Diagnosis not present

## 2016-05-29 DIAGNOSIS — I129 Hypertensive chronic kidney disease with stage 1 through stage 4 chronic kidney disease, or unspecified chronic kidney disease: Secondary | ICD-10-CM | POA: Diagnosis not present

## 2016-05-29 DIAGNOSIS — F329 Major depressive disorder, single episode, unspecified: Secondary | ICD-10-CM | POA: Diagnosis not present

## 2016-05-29 DIAGNOSIS — E1122 Type 2 diabetes mellitus with diabetic chronic kidney disease: Secondary | ICD-10-CM | POA: Diagnosis not present

## 2016-05-29 DIAGNOSIS — E1142 Type 2 diabetes mellitus with diabetic polyneuropathy: Secondary | ICD-10-CM | POA: Diagnosis not present

## 2016-05-29 DIAGNOSIS — G250 Essential tremor: Secondary | ICD-10-CM | POA: Diagnosis not present

## 2016-05-29 DIAGNOSIS — N189 Chronic kidney disease, unspecified: Secondary | ICD-10-CM | POA: Diagnosis not present

## 2016-05-29 DIAGNOSIS — G4700 Insomnia, unspecified: Secondary | ICD-10-CM | POA: Diagnosis not present

## 2016-05-29 DIAGNOSIS — G2581 Restless legs syndrome: Secondary | ICD-10-CM | POA: Diagnosis not present

## 2016-05-29 DIAGNOSIS — M80051D Age-related osteoporosis with current pathological fracture, right femur, subsequent encounter for fracture with routine healing: Secondary | ICD-10-CM | POA: Diagnosis not present

## 2016-05-30 DIAGNOSIS — G250 Essential tremor: Secondary | ICD-10-CM | POA: Diagnosis not present

## 2016-05-30 DIAGNOSIS — M80051D Age-related osteoporosis with current pathological fracture, right femur, subsequent encounter for fracture with routine healing: Secondary | ICD-10-CM | POA: Diagnosis not present

## 2016-05-30 DIAGNOSIS — I129 Hypertensive chronic kidney disease with stage 1 through stage 4 chronic kidney disease, or unspecified chronic kidney disease: Secondary | ICD-10-CM | POA: Diagnosis not present

## 2016-05-30 DIAGNOSIS — E1142 Type 2 diabetes mellitus with diabetic polyneuropathy: Secondary | ICD-10-CM | POA: Diagnosis not present

## 2016-05-30 DIAGNOSIS — N189 Chronic kidney disease, unspecified: Secondary | ICD-10-CM | POA: Diagnosis not present

## 2016-05-30 DIAGNOSIS — E1122 Type 2 diabetes mellitus with diabetic chronic kidney disease: Secondary | ICD-10-CM | POA: Diagnosis not present

## 2016-05-30 DIAGNOSIS — G4700 Insomnia, unspecified: Secondary | ICD-10-CM | POA: Diagnosis not present

## 2016-05-30 DIAGNOSIS — F329 Major depressive disorder, single episode, unspecified: Secondary | ICD-10-CM | POA: Diagnosis not present

## 2016-05-30 DIAGNOSIS — G2581 Restless legs syndrome: Secondary | ICD-10-CM | POA: Diagnosis not present

## 2016-05-31 DIAGNOSIS — N189 Chronic kidney disease, unspecified: Secondary | ICD-10-CM | POA: Diagnosis not present

## 2016-05-31 DIAGNOSIS — F329 Major depressive disorder, single episode, unspecified: Secondary | ICD-10-CM | POA: Diagnosis not present

## 2016-05-31 DIAGNOSIS — G2581 Restless legs syndrome: Secondary | ICD-10-CM | POA: Diagnosis not present

## 2016-05-31 DIAGNOSIS — I129 Hypertensive chronic kidney disease with stage 1 through stage 4 chronic kidney disease, or unspecified chronic kidney disease: Secondary | ICD-10-CM | POA: Diagnosis not present

## 2016-05-31 DIAGNOSIS — E1142 Type 2 diabetes mellitus with diabetic polyneuropathy: Secondary | ICD-10-CM | POA: Diagnosis not present

## 2016-05-31 DIAGNOSIS — G250 Essential tremor: Secondary | ICD-10-CM | POA: Diagnosis not present

## 2016-05-31 DIAGNOSIS — M80051D Age-related osteoporosis with current pathological fracture, right femur, subsequent encounter for fracture with routine healing: Secondary | ICD-10-CM | POA: Diagnosis not present

## 2016-05-31 DIAGNOSIS — G4700 Insomnia, unspecified: Secondary | ICD-10-CM | POA: Diagnosis not present

## 2016-05-31 DIAGNOSIS — E1122 Type 2 diabetes mellitus with diabetic chronic kidney disease: Secondary | ICD-10-CM | POA: Diagnosis not present

## 2016-06-05 DIAGNOSIS — E1122 Type 2 diabetes mellitus with diabetic chronic kidney disease: Secondary | ICD-10-CM | POA: Diagnosis not present

## 2016-06-05 DIAGNOSIS — G4700 Insomnia, unspecified: Secondary | ICD-10-CM | POA: Diagnosis not present

## 2016-06-05 DIAGNOSIS — M80051D Age-related osteoporosis with current pathological fracture, right femur, subsequent encounter for fracture with routine healing: Secondary | ICD-10-CM | POA: Diagnosis not present

## 2016-06-05 DIAGNOSIS — G250 Essential tremor: Secondary | ICD-10-CM | POA: Diagnosis not present

## 2016-06-05 DIAGNOSIS — G2581 Restless legs syndrome: Secondary | ICD-10-CM | POA: Diagnosis not present

## 2016-06-05 DIAGNOSIS — E1142 Type 2 diabetes mellitus with diabetic polyneuropathy: Secondary | ICD-10-CM | POA: Diagnosis not present

## 2016-06-05 DIAGNOSIS — N189 Chronic kidney disease, unspecified: Secondary | ICD-10-CM | POA: Diagnosis not present

## 2016-06-05 DIAGNOSIS — I129 Hypertensive chronic kidney disease with stage 1 through stage 4 chronic kidney disease, or unspecified chronic kidney disease: Secondary | ICD-10-CM | POA: Diagnosis not present

## 2016-06-05 DIAGNOSIS — F329 Major depressive disorder, single episode, unspecified: Secondary | ICD-10-CM | POA: Diagnosis not present

## 2016-06-06 DIAGNOSIS — I129 Hypertensive chronic kidney disease with stage 1 through stage 4 chronic kidney disease, or unspecified chronic kidney disease: Secondary | ICD-10-CM | POA: Diagnosis not present

## 2016-06-06 DIAGNOSIS — E1122 Type 2 diabetes mellitus with diabetic chronic kidney disease: Secondary | ICD-10-CM | POA: Diagnosis not present

## 2016-06-06 DIAGNOSIS — G250 Essential tremor: Secondary | ICD-10-CM | POA: Diagnosis not present

## 2016-06-06 DIAGNOSIS — G2581 Restless legs syndrome: Secondary | ICD-10-CM | POA: Diagnosis not present

## 2016-06-06 DIAGNOSIS — N189 Chronic kidney disease, unspecified: Secondary | ICD-10-CM | POA: Diagnosis not present

## 2016-06-06 DIAGNOSIS — E1142 Type 2 diabetes mellitus with diabetic polyneuropathy: Secondary | ICD-10-CM | POA: Diagnosis not present

## 2016-06-06 DIAGNOSIS — G4700 Insomnia, unspecified: Secondary | ICD-10-CM | POA: Diagnosis not present

## 2016-06-06 DIAGNOSIS — F329 Major depressive disorder, single episode, unspecified: Secondary | ICD-10-CM | POA: Diagnosis not present

## 2016-06-06 DIAGNOSIS — M80051D Age-related osteoporosis with current pathological fracture, right femur, subsequent encounter for fracture with routine healing: Secondary | ICD-10-CM | POA: Diagnosis not present

## 2016-06-17 DIAGNOSIS — H25012 Cortical age-related cataract, left eye: Secondary | ICD-10-CM | POA: Diagnosis not present

## 2016-06-18 DIAGNOSIS — H2512 Age-related nuclear cataract, left eye: Secondary | ICD-10-CM | POA: Diagnosis not present

## 2016-06-19 DIAGNOSIS — H2511 Age-related nuclear cataract, right eye: Secondary | ICD-10-CM | POA: Diagnosis not present

## 2016-07-01 DIAGNOSIS — H25011 Cortical age-related cataract, right eye: Secondary | ICD-10-CM | POA: Diagnosis not present

## 2016-07-02 DIAGNOSIS — H2511 Age-related nuclear cataract, right eye: Secondary | ICD-10-CM | POA: Diagnosis not present

## 2016-07-12 DIAGNOSIS — S72141D Displaced intertrochanteric fracture of right femur, subsequent encounter for closed fracture with routine healing: Secondary | ICD-10-CM | POA: Diagnosis not present

## 2016-08-22 DIAGNOSIS — F0390 Unspecified dementia without behavioral disturbance: Secondary | ICD-10-CM | POA: Diagnosis not present

## 2016-08-22 DIAGNOSIS — G250 Essential tremor: Secondary | ICD-10-CM | POA: Diagnosis not present

## 2016-09-05 DIAGNOSIS — J019 Acute sinusitis, unspecified: Secondary | ICD-10-CM | POA: Diagnosis not present

## 2016-09-05 DIAGNOSIS — M79604 Pain in right leg: Secondary | ICD-10-CM | POA: Diagnosis not present

## 2016-09-05 DIAGNOSIS — I10 Essential (primary) hypertension: Secondary | ICD-10-CM | POA: Diagnosis not present

## 2016-09-05 DIAGNOSIS — E119 Type 2 diabetes mellitus without complications: Secondary | ICD-10-CM | POA: Diagnosis not present

## 2016-09-12 DIAGNOSIS — Z1389 Encounter for screening for other disorder: Secondary | ICD-10-CM | POA: Diagnosis not present

## 2016-09-12 DIAGNOSIS — H9120 Sudden idiopathic hearing loss, unspecified ear: Secondary | ICD-10-CM | POA: Diagnosis not present

## 2016-09-12 DIAGNOSIS — H6120 Impacted cerumen, unspecified ear: Secondary | ICD-10-CM | POA: Diagnosis not present

## 2016-09-12 DIAGNOSIS — E119 Type 2 diabetes mellitus without complications: Secondary | ICD-10-CM | POA: Diagnosis not present

## 2016-09-17 DIAGNOSIS — H6503 Acute serous otitis media, bilateral: Secondary | ICD-10-CM | POA: Diagnosis not present

## 2016-09-17 DIAGNOSIS — H906 Mixed conductive and sensorineural hearing loss, bilateral: Secondary | ICD-10-CM | POA: Diagnosis not present

## 2016-09-19 DIAGNOSIS — G250 Essential tremor: Secondary | ICD-10-CM | POA: Diagnosis not present

## 2016-09-19 DIAGNOSIS — F0390 Unspecified dementia without behavioral disturbance: Secondary | ICD-10-CM | POA: Diagnosis not present

## 2016-10-08 DIAGNOSIS — H6503 Acute serous otitis media, bilateral: Secondary | ICD-10-CM | POA: Diagnosis not present

## 2016-10-08 DIAGNOSIS — H906 Mixed conductive and sensorineural hearing loss, bilateral: Secondary | ICD-10-CM | POA: Diagnosis not present

## 2016-10-17 DIAGNOSIS — R634 Abnormal weight loss: Secondary | ICD-10-CM | POA: Diagnosis not present

## 2016-10-17 DIAGNOSIS — I69359 Hemiplegia and hemiparesis following cerebral infarction affecting unspecified side: Secondary | ICD-10-CM | POA: Diagnosis not present

## 2016-10-17 DIAGNOSIS — Z23 Encounter for immunization: Secondary | ICD-10-CM | POA: Diagnosis not present

## 2016-10-17 DIAGNOSIS — R296 Repeated falls: Secondary | ICD-10-CM | POA: Diagnosis not present

## 2016-10-17 DIAGNOSIS — G250 Essential tremor: Secondary | ICD-10-CM | POA: Diagnosis not present

## 2016-10-17 DIAGNOSIS — F329 Major depressive disorder, single episode, unspecified: Secondary | ICD-10-CM | POA: Diagnosis not present

## 2016-10-17 DIAGNOSIS — Z9181 History of falling: Secondary | ICD-10-CM | POA: Diagnosis not present

## 2016-10-17 DIAGNOSIS — E119 Type 2 diabetes mellitus without complications: Secondary | ICD-10-CM | POA: Diagnosis not present

## 2016-10-17 DIAGNOSIS — I10 Essential (primary) hypertension: Secondary | ICD-10-CM | POA: Diagnosis not present

## 2016-10-17 DIAGNOSIS — G2581 Restless legs syndrome: Secondary | ICD-10-CM | POA: Diagnosis not present

## 2016-10-19 DIAGNOSIS — Z79899 Other long term (current) drug therapy: Secondary | ICD-10-CM | POA: Diagnosis not present

## 2016-10-26 DIAGNOSIS — Z7984 Long term (current) use of oral hypoglycemic drugs: Secondary | ICD-10-CM | POA: Diagnosis not present

## 2016-10-26 DIAGNOSIS — E119 Type 2 diabetes mellitus without complications: Secondary | ICD-10-CM | POA: Diagnosis not present

## 2016-10-26 DIAGNOSIS — R296 Repeated falls: Secondary | ICD-10-CM | POA: Diagnosis not present

## 2016-10-26 DIAGNOSIS — I10 Essential (primary) hypertension: Secondary | ICD-10-CM | POA: Diagnosis not present

## 2016-10-31 DIAGNOSIS — Z7984 Long term (current) use of oral hypoglycemic drugs: Secondary | ICD-10-CM | POA: Diagnosis not present

## 2016-10-31 DIAGNOSIS — E119 Type 2 diabetes mellitus without complications: Secondary | ICD-10-CM | POA: Diagnosis not present

## 2016-10-31 DIAGNOSIS — I10 Essential (primary) hypertension: Secondary | ICD-10-CM | POA: Diagnosis not present

## 2016-10-31 DIAGNOSIS — R296 Repeated falls: Secondary | ICD-10-CM | POA: Diagnosis not present

## 2016-11-02 DIAGNOSIS — I10 Essential (primary) hypertension: Secondary | ICD-10-CM | POA: Diagnosis not present

## 2016-11-02 DIAGNOSIS — Z7984 Long term (current) use of oral hypoglycemic drugs: Secondary | ICD-10-CM | POA: Diagnosis not present

## 2016-11-02 DIAGNOSIS — E119 Type 2 diabetes mellitus without complications: Secondary | ICD-10-CM | POA: Diagnosis not present

## 2016-11-02 DIAGNOSIS — R296 Repeated falls: Secondary | ICD-10-CM | POA: Diagnosis not present

## 2016-11-07 DIAGNOSIS — R296 Repeated falls: Secondary | ICD-10-CM | POA: Diagnosis not present

## 2016-11-07 DIAGNOSIS — I10 Essential (primary) hypertension: Secondary | ICD-10-CM | POA: Diagnosis not present

## 2016-11-07 DIAGNOSIS — E119 Type 2 diabetes mellitus without complications: Secondary | ICD-10-CM | POA: Diagnosis not present

## 2016-11-07 DIAGNOSIS — Z7984 Long term (current) use of oral hypoglycemic drugs: Secondary | ICD-10-CM | POA: Diagnosis not present

## 2016-11-08 DIAGNOSIS — R296 Repeated falls: Secondary | ICD-10-CM | POA: Diagnosis not present

## 2016-11-08 DIAGNOSIS — E119 Type 2 diabetes mellitus without complications: Secondary | ICD-10-CM | POA: Diagnosis not present

## 2016-11-08 DIAGNOSIS — Z7984 Long term (current) use of oral hypoglycemic drugs: Secondary | ICD-10-CM | POA: Diagnosis not present

## 2016-11-08 DIAGNOSIS — I10 Essential (primary) hypertension: Secondary | ICD-10-CM | POA: Diagnosis not present

## 2016-11-13 DIAGNOSIS — R296 Repeated falls: Secondary | ICD-10-CM | POA: Diagnosis not present

## 2016-11-13 DIAGNOSIS — Z7984 Long term (current) use of oral hypoglycemic drugs: Secondary | ICD-10-CM | POA: Diagnosis not present

## 2016-11-13 DIAGNOSIS — E119 Type 2 diabetes mellitus without complications: Secondary | ICD-10-CM | POA: Diagnosis not present

## 2016-11-13 DIAGNOSIS — I10 Essential (primary) hypertension: Secondary | ICD-10-CM | POA: Diagnosis not present

## 2016-11-16 DIAGNOSIS — Z7984 Long term (current) use of oral hypoglycemic drugs: Secondary | ICD-10-CM | POA: Diagnosis not present

## 2016-11-16 DIAGNOSIS — I10 Essential (primary) hypertension: Secondary | ICD-10-CM | POA: Diagnosis not present

## 2016-11-16 DIAGNOSIS — R296 Repeated falls: Secondary | ICD-10-CM | POA: Diagnosis not present

## 2016-11-16 DIAGNOSIS — E119 Type 2 diabetes mellitus without complications: Secondary | ICD-10-CM | POA: Diagnosis not present

## 2016-11-20 DIAGNOSIS — R296 Repeated falls: Secondary | ICD-10-CM | POA: Diagnosis not present

## 2016-11-20 DIAGNOSIS — G250 Essential tremor: Secondary | ICD-10-CM | POA: Diagnosis not present

## 2016-11-20 DIAGNOSIS — R269 Unspecified abnormalities of gait and mobility: Secondary | ICD-10-CM | POA: Diagnosis not present

## 2016-11-20 DIAGNOSIS — Z7984 Long term (current) use of oral hypoglycemic drugs: Secondary | ICD-10-CM | POA: Diagnosis not present

## 2016-11-20 DIAGNOSIS — I10 Essential (primary) hypertension: Secondary | ICD-10-CM | POA: Diagnosis not present

## 2016-11-20 DIAGNOSIS — F0390 Unspecified dementia without behavioral disturbance: Secondary | ICD-10-CM | POA: Diagnosis not present

## 2016-11-20 DIAGNOSIS — E119 Type 2 diabetes mellitus without complications: Secondary | ICD-10-CM | POA: Diagnosis not present

## 2016-11-26 ENCOUNTER — Other Ambulatory Visit: Payer: Self-pay | Admitting: Nurse Practitioner

## 2016-11-26 DIAGNOSIS — S0990XA Unspecified injury of head, initial encounter: Secondary | ICD-10-CM | POA: Diagnosis not present

## 2016-11-26 DIAGNOSIS — R269 Unspecified abnormalities of gait and mobility: Secondary | ICD-10-CM | POA: Diagnosis not present

## 2016-11-26 DIAGNOSIS — G250 Essential tremor: Secondary | ICD-10-CM | POA: Diagnosis not present

## 2016-11-26 DIAGNOSIS — F0390 Unspecified dementia without behavioral disturbance: Secondary | ICD-10-CM | POA: Diagnosis not present

## 2016-11-27 ENCOUNTER — Ambulatory Visit
Admission: RE | Admit: 2016-11-27 | Discharge: 2016-11-27 | Disposition: A | Discharge status: Home / Self Care | Payer: Medicare HMO | Source: Ambulatory Visit | Attending: Nurse Practitioner | Admitting: Nurse Practitioner

## 2016-11-27 DIAGNOSIS — H748X3 Other specified disorders of middle ear and mastoid, bilateral: Secondary | ICD-10-CM | POA: Diagnosis not present

## 2016-11-27 DIAGNOSIS — S0990XA Unspecified injury of head, initial encounter: Secondary | ICD-10-CM

## 2016-11-27 DIAGNOSIS — I6782 Cerebral ischemia: Secondary | ICD-10-CM | POA: Diagnosis not present

## 2016-11-27 MED ORDER — GADOBENATE DIMEGLUMINE 529 MG/ML IV SOLN
10.0000 mL | Freq: Once | INTRAVENOUS | Status: AC | PRN
Start: 2016-11-27 — End: 2016-11-27
  Administered 2016-11-27: 10 mL via INTRAVENOUS

## 2017-01-17 DIAGNOSIS — G250 Essential tremor: Secondary | ICD-10-CM | POA: Diagnosis not present

## 2017-01-22 ENCOUNTER — Encounter: Payer: Self-pay | Admitting: Emergency Medicine

## 2017-01-22 ENCOUNTER — Emergency Department
Admission: EM | Admit: 2017-01-22 | Discharge: 2017-01-22 | Disposition: A | Discharge status: Home / Self Care | Payer: Medicare HMO | Attending: Student in an Organized Health Care Education/Training Program | Admitting: Student in an Organized Health Care Education/Training Program

## 2017-01-22 ENCOUNTER — Emergency Department: Payer: Medicare HMO

## 2017-01-22 DIAGNOSIS — S52501A Unspecified fracture of the lower end of right radius, initial encounter for closed fracture: Secondary | ICD-10-CM | POA: Diagnosis not present

## 2017-01-22 DIAGNOSIS — Z7983 Long term (current) use of bisphosphonates: Secondary | ICD-10-CM | POA: Diagnosis not present

## 2017-01-22 DIAGNOSIS — Y929 Unspecified place or not applicable: Secondary | ICD-10-CM | POA: Diagnosis not present

## 2017-01-22 DIAGNOSIS — Y939 Activity, unspecified: Secondary | ICD-10-CM | POA: Diagnosis not present

## 2017-01-22 DIAGNOSIS — S6992XA Unspecified injury of left wrist, hand and finger(s), initial encounter: Secondary | ICD-10-CM | POA: Diagnosis not present

## 2017-01-22 DIAGNOSIS — Z8673 Personal history of transient ischemic attack (TIA), and cerebral infarction without residual deficits: Secondary | ICD-10-CM | POA: Diagnosis not present

## 2017-01-22 DIAGNOSIS — Y999 Unspecified external cause status: Secondary | ICD-10-CM | POA: Diagnosis not present

## 2017-01-22 DIAGNOSIS — E119 Type 2 diabetes mellitus without complications: Secondary | ICD-10-CM | POA: Insufficient documentation

## 2017-01-22 DIAGNOSIS — W010XXA Fall on same level from slipping, tripping and stumbling without subsequent striking against object, initial encounter: Secondary | ICD-10-CM | POA: Diagnosis not present

## 2017-01-22 DIAGNOSIS — I10 Essential (primary) hypertension: Secondary | ICD-10-CM | POA: Insufficient documentation

## 2017-01-22 DIAGNOSIS — M25532 Pain in left wrist: Secondary | ICD-10-CM | POA: Diagnosis not present

## 2017-01-22 DIAGNOSIS — Z7901 Long term (current) use of anticoagulants: Secondary | ICD-10-CM | POA: Insufficient documentation

## 2017-01-22 DIAGNOSIS — S6991XA Unspecified injury of right wrist, hand and finger(s), initial encounter: Secondary | ICD-10-CM | POA: Diagnosis present

## 2017-01-22 NOTE — ED Provider Notes (Signed)
[facility] Emergency Department Provider Note   ____________________________________________   First MD Initiated Contact with Patient 01/22/17 1330     (approximate)  I have reviewed the triage vital signs and the nursing notes.   HISTORY  Chief Complaint Wrist Pain    HPI Carrie Bishop is a 83 y.o. female patient complaining of left wrist pain secondary to a trip and fall last night. Patient states she tried to break her fall for left hand causing a hyperextension injury. Patient denies loss of function or loss of sensation. Patient is right-hand dominant.Patient rates pain as 8/10. Patient describes pain as "achy".   Past Medical History:  Diagnosis Date  . Depression   . Diabetes mellitus without complication ([place])   . Hip fracture [facility])    left March 2017  . Hypertension   . Neuropathy   . Osteoporosis   . Restless leg syndrome   . Stroke ([place])   . Tremor     Patient Active Problem List   Diagnosis Date Noted  . Closed intratrochanteric fracture of right femur 04/03/2016  . S/P ORIF (open reduction internal fixation) fracture 04/03/2016  . Diabetes ([place]) 04/03/2016  . Essential hypertension 04/03/2016  . Essential tremor 04/03/2016  . Acute posthemorrhagic anemia 04/03/2016  . Hyponatremia 04/03/2016  . Closed intertrochanteric fracture of femur ([place]) 03/30/2016  . Osteoporosis 11/07/2015  . Insomnia 11/07/2015  . Neuropathic pain 11/07/2015  . Depression 11/07/2015  . DM type 2 (diabetes mellitus, type 2) ([place]) 11/01/2015  . Intertrochanteric fracture of left hip ([place]) 10/20/2015  . Chronic renal insufficiency 10/20/2015  . Anemia 10/20/2015  . Closed left hip fracture ([place]) 10/20/2015  . Benign essential tremor 09/20/2015    Past Surgical History:  Procedure Laterality Date  . ABDOMINAL HYSTERECTOMY    . arm surgery    . INTRAMEDULLARY (IM) NAIL INTERTROCHANTERIC Left 10/21/2015   Procedure: INTRAMEDULLARY (IM) NAIL  INTERTROCHANTRIC LEFT LONG AFFIXUS;  Surgeon: Hessie Knows, MD;  Location: [facility] ORS;  Service: Orthopedics;  Laterality: Left;  . INTRAMEDULLARY (IM) NAIL INTERTROCHANTERIC Right 03/31/2016   Procedure: INTRAMEDULLARY (IM) NAIL INTERTROCHANTRIC;  Surgeon: Dereck Leep, MD;  Location: [facility] ORS;  Service: Orthopedics;  Laterality: Right;  . LEG SURGERY      Prior to Admission medications   Medication Sig Start Date End Date Taking? Authorizing Provider  alendronate (FOSAMAX) 70 MG tablet Take 70 mg by mouth once a week. Take with a full glass of water on an empty stomach. On Fridays.    [provider]  atorvastatin (LIPITOR) 10 MG tablet Take 1 tablet (10 mg total) by mouth at bedtime. 04/03/16   Theodoro Grist, MD  baclofen (LIORESAL) 10 MG tablet Take 1 tablet (10 mg total) by mouth every 8 (eight) hours as needed for muscle spasms. 04/03/16   Theodoro Grist, MD  cholecalciferol (VITAMIN D) 1000 units tablet Take 1,000 Units by mouth daily.    [provider]  docusate sodium (COLACE) 100 MG capsule Take 1 capsule (100 mg total) by mouth 2 (two) times daily. 04/03/16   Theodoro Grist, MD  DULoxetine (CYMBALTA) 60 MG capsule Take 60 mg by mouth daily.     [provider]  enoxaparin (LOVENOX) 40 MG/0.4ML injection Inject 0.4 mLs (40 mg total) into the skin daily. 04/02/16 04/17/16  Duanne Guess, PA-C  ferrous sulfate 325 (65 FE) MG tablet Take 1 tablet (325 mg total) by mouth 2 (two) times daily with a meal. 04/03/16  Theodoro Grist, MD  gabapentin (NEURONTIN) 600 MG tablet Take 600 mg by mouth 3 (three) times daily.    [provider]  lisinopril (PRINIVIL,ZESTRIL) 40 MG tablet Take 1 tablet by mouth at bedtime.  09/26/15   [provider]  oxyCODONE (OXY IR/ROXICODONE) 5 MG immediate release tablet Take 1-2 tablets (5-10 mg total) by mouth every 4 (four) hours as needed for breakthrough pain ((for MODERATE breakthrough pain)). 04/02/16   Duanne Guess, PA-C    pantoprazole (PROTONIX) 40 MG tablet Take 1 tablet (40 mg total) by mouth 2 (two) times daily. 04/03/16   Theodoro Grist, MD  primidone (MYSOLINE) 50 MG tablet Take 100 mg by mouth at bedtime.     [provider]  propranolol ER (INDERAL LA) 80 MG 24 hr capsule Take 1 capsule (80 mg total) by mouth daily. 04/03/16   Theodoro Grist, MD  senna-docusate (SENOKOT-S) 8.6-50 MG tablet Take 1 tablet by mouth 2 (two) times daily. 04/03/16   Theodoro Grist, MD  traMADol (ULTRAM) 50 MG tablet Take 1-2 tablets (50-100 mg total) by mouth every 4 (four) hours as needed for moderate pain. 04/02/16   Duanne Guess, PA-C  traZODone (DESYREL) 100 MG tablet Take 100-200 mg by mouth at bedtime as needed for sleep.    [provider]    Allergies Patient has no known allergies.  Family History  Problem Relation Age of Onset  . Hypertension Other     Social History Social History  Substance Use Topics  . Smoking status: Never Smoker  . Smokeless tobacco: Never Used  . Alcohol use No    Review of Systems  Constitutional: No fever/chills Eyes: No visual changes. ENT: No sore throat. Cardiovascular: Denies chest pain. Respiratory: Denies shortness of breath. Gastrointestinal: No abdominal pain.  No nausea, no vomiting.  No diarrhea.  No constipation. Genitourinary: Negative for dysuria. Musculoskeletal: Negative for back pain. Skin: Negative for rash. Neurological: Negative for headaches, focal weakness or numbness. Tremors and restless leg syndrome Psychiatric:Depression Endocrine:Diabetes and hyperlipidemia. ____________________________________________   PHYSICAL EXAM:  VITAL SIGNS: ED Triage Vitals  Enc Vitals Group     BP 01/22/17 1244 (!) 152/84     Pulse Rate 01/22/17 1244 98     Resp 01/22/17 1244 16     Temp 01/22/17 1244 98 F (36.7 C)     Temp Source 01/22/17 1244 Oral     SpO2 01/22/17 1244 98 %     Weight 01/22/17 1245 104 lb (47.2 kg)     Height 01/22/17  1245 5\' 6"  (1.676 m)     Head Circumference --      Peak Flow --      Pain Score 01/22/17 1244 8     Pain Loc --      Pain Edu? --      Excl. in [place]? --     Constitutional: Alert and oriented. Well appearing and in no acute distress. Head: Atraumatic. Nose: No congestion/rhinnorhea. Mouth/Throat: Mucous membranes are moist.  Oropharynx non-erythematous. Neck: No stridor.  No cervical spine tenderness to palpation.* Hematological/Lymphatic/Immunilogical: No cervical lymphadenopathy. Cardiovascular: Normal rate, regular rhythm. Grossly normal heart sounds.  Good peripheral circulation. Respiratory: Normal respiratory effort.  No retractions. Lungs CTAB. Musculoskeletal: No obvious deformity to the left wrist. There is mild edema to the distal radius. Patient has moderate guarding palpation to the distal radius. Decreased range of motion of by complaining of pain.  Neurologic:  Normal speech and language. No gross focal neurologic  deficits are appreciated. No gait instability. Skin:  Skin is warm, dry and intact. No rash noted. Psychiatric: Mood and affect are normal. Speech and behavior are normal.  ____________________________________________   LABS (all labs ordered are listed, but only abnormal results are displayed)  Labs Reviewed - No data to display ____________________________________________  EKG   ____________________________________________  RADIOLOGY  Dg Wrist Complete Left  Result Date: 01/22/2017 CLINICAL DATA:  Fall with wrist pain EXAM: LEFT WRIST - COMPLETE 3+ VIEW COMPARISON:  None. FINDINGS: There is a nondisplaced, oblique, at intra-articular fracture of the lateral aspect of the left radius. There is severe osteoarthrosis of the first carpometacarpal joint. There are arterial vascular calcifications. There is chondrocalcinosis at the ulnocarpal joint. The scaphoid is normal. Soft tissue swelling is severe. IMPRESSION: 1. Nondisplaced, oblique, intra-articular  fracture of the lateral aspect of the distal left radius with associated soft tissue swelling. 2. Severe left first carpometacarpal joint osteoarthrosis. Electronically Signed   By: Ulyses Jarred M.D.   On: 01/22/2017 13:27    ____________________________________________   PROCEDURES  Procedure(s) performed: None  Procedures  Critical Care performed: No  ____________________________________________   INITIAL IMPRESSION / ASSESSMENT AND PLAN / ED COURSE  Pertinent labs & imaging results that were available during my care of the patient were reviewed by me and considered in my medical decision making (see chart for details).  Nondisplaced distal left radial fracture. Discussed x-ray finding with patient and family members. Patient placed in a Velcro wrist splint and advised follow-up with orthopedics for definitive evaluation and treatment.      ____________________________________________   FINAL CLINICAL IMPRESSION(S) / ED DIAGNOSES  Final diagnoses:  Closed fracture of distal end of right radius, unspecified fracture morphology, initial encounter      NEW MEDICATIONS STARTED DURING THIS VISIT:  New Prescriptions   No medications on file     Note:  This document was prepared using Dragon voice recognition software and may include unintentional dictation errors.    Sable Feil, PA-C 01/22/17 1341    Merlyn Lot, MD 01/22/17 [phone]

## 2017-01-22 NOTE — ED Triage Notes (Signed)
Patient presents to ED via POV from home. Patient was walking last night and tripped and fell. Patient denies LOC or hitting head. Patient attempted to catch herself. Patient with obvious deformity noted to left wrist. Positive pulses and sensation.

## 2017-01-22 NOTE — Discharge Instructions (Signed)
Wear splint until evaluation by Ortho. Take Tylenol for pain.

## 2017-01-25 DIAGNOSIS — S52513A Displaced fracture of unspecified radial styloid process, initial encounter for closed fracture: Secondary | ICD-10-CM | POA: Insufficient documentation

## 2017-01-25 DIAGNOSIS — S52515A Nondisplaced fracture of left radial styloid process, initial encounter for closed fracture: Secondary | ICD-10-CM | POA: Diagnosis not present

## 2017-02-04 DIAGNOSIS — S52515D Nondisplaced fracture of left radial styloid process, subsequent encounter for closed fracture with routine healing: Secondary | ICD-10-CM | POA: Diagnosis not present

## 2017-02-04 DIAGNOSIS — M810 Age-related osteoporosis without current pathological fracture: Secondary | ICD-10-CM | POA: Diagnosis not present

## 2017-02-04 DIAGNOSIS — G250 Essential tremor: Secondary | ICD-10-CM | POA: Diagnosis not present

## 2017-02-04 DIAGNOSIS — F329 Major depressive disorder, single episode, unspecified: Secondary | ICD-10-CM | POA: Diagnosis not present

## 2017-02-04 DIAGNOSIS — I10 Essential (primary) hypertension: Secondary | ICD-10-CM | POA: Diagnosis not present

## 2017-02-04 DIAGNOSIS — I69359 Hemiplegia and hemiparesis following cerebral infarction affecting unspecified side: Secondary | ICD-10-CM | POA: Diagnosis not present

## 2017-02-04 DIAGNOSIS — R296 Repeated falls: Secondary | ICD-10-CM | POA: Diagnosis not present

## 2017-02-04 DIAGNOSIS — M79604 Pain in right leg: Secondary | ICD-10-CM | POA: Diagnosis not present

## 2017-02-04 DIAGNOSIS — R634 Abnormal weight loss: Secondary | ICD-10-CM | POA: Diagnosis not present

## 2017-02-04 DIAGNOSIS — E119 Type 2 diabetes mellitus without complications: Secondary | ICD-10-CM | POA: Diagnosis not present

## 2017-02-05 DIAGNOSIS — E119 Type 2 diabetes mellitus without complications: Secondary | ICD-10-CM | POA: Diagnosis not present

## 2017-03-05 DIAGNOSIS — S52515D Nondisplaced fracture of left radial styloid process, subsequent encounter for closed fracture with routine healing: Secondary | ICD-10-CM | POA: Diagnosis not present

## 2017-03-13 DIAGNOSIS — S00522A Blister (nonthermal) of oral cavity, initial encounter: Secondary | ICD-10-CM | POA: Diagnosis not present

## 2017-03-13 DIAGNOSIS — Z9181 History of falling: Secondary | ICD-10-CM | POA: Diagnosis not present

## 2017-03-13 DIAGNOSIS — Z1231 Encounter for screening mammogram for malignant neoplasm of breast: Secondary | ICD-10-CM | POA: Diagnosis not present

## 2017-03-13 DIAGNOSIS — Z1389 Encounter for screening for other disorder: Secondary | ICD-10-CM | POA: Diagnosis not present

## 2017-03-13 DIAGNOSIS — E785 Hyperlipidemia, unspecified: Secondary | ICD-10-CM | POA: Diagnosis not present

## 2017-03-13 DIAGNOSIS — N959 Unspecified menopausal and perimenopausal disorder: Secondary | ICD-10-CM | POA: Diagnosis not present

## 2017-03-13 DIAGNOSIS — E7800 Pure hypercholesterolemia, unspecified: Secondary | ICD-10-CM | POA: Diagnosis not present

## 2017-03-13 DIAGNOSIS — Z136 Encounter for screening for cardiovascular disorders: Secondary | ICD-10-CM | POA: Diagnosis not present

## 2017-03-13 DIAGNOSIS — Z0000 Encounter for general adult medical examination without abnormal findings: Secondary | ICD-10-CM | POA: Diagnosis not present

## 2017-03-20 DIAGNOSIS — Z681 Body mass index (BMI) 19 or less, adult: Secondary | ICD-10-CM | POA: Diagnosis not present

## 2017-03-20 DIAGNOSIS — K1370 Unspecified lesions of oral mucosa: Secondary | ICD-10-CM | POA: Diagnosis not present

## 2017-03-29 DIAGNOSIS — H6121 Impacted cerumen, right ear: Secondary | ICD-10-CM | POA: Diagnosis not present

## 2017-03-29 DIAGNOSIS — D102 Benign neoplasm of floor of mouth: Secondary | ICD-10-CM | POA: Diagnosis not present

## 2017-04-08 ENCOUNTER — Encounter: Payer: Self-pay | Admitting: *Deleted

## 2017-04-08 ENCOUNTER — Emergency Department
Admission: EM | Admit: 2017-04-08 | Discharge: 2017-04-08 | Disposition: A | Discharge status: Home / Self Care | Payer: Medicare HMO | Attending: Emergency Medicine | Admitting: Emergency Medicine

## 2017-04-08 ENCOUNTER — Emergency Department: Payer: Medicare HMO

## 2017-04-08 DIAGNOSIS — I129 Hypertensive chronic kidney disease with stage 1 through stage 4 chronic kidney disease, or unspecified chronic kidney disease: Secondary | ICD-10-CM | POA: Diagnosis not present

## 2017-04-08 DIAGNOSIS — Z7901 Long term (current) use of anticoagulants: Secondary | ICD-10-CM | POA: Diagnosis not present

## 2017-04-08 DIAGNOSIS — G629 Polyneuropathy, unspecified: Secondary | ICD-10-CM

## 2017-04-08 DIAGNOSIS — N189 Chronic kidney disease, unspecified: Secondary | ICD-10-CM | POA: Diagnosis not present

## 2017-04-08 DIAGNOSIS — E1140 Type 2 diabetes mellitus with diabetic neuropathy, unspecified: Secondary | ICD-10-CM | POA: Diagnosis not present

## 2017-04-08 DIAGNOSIS — Z79899 Other long term (current) drug therapy: Secondary | ICD-10-CM | POA: Diagnosis not present

## 2017-04-08 DIAGNOSIS — M79672 Pain in left foot: Secondary | ICD-10-CM | POA: Insufficient documentation

## 2017-04-08 DIAGNOSIS — E1122 Type 2 diabetes mellitus with diabetic chronic kidney disease: Secondary | ICD-10-CM | POA: Diagnosis not present

## 2017-04-08 NOTE — ED Notes (Signed)
See triage note  Presents with increased pain to left foot  Hx of diabetic neuropathy but states pain is worse  No injury

## 2017-04-08 NOTE — ED Triage Notes (Signed)
States hx of neuropathy on neurotin, states increased left foot burning that began this AM, denies any injury

## 2017-04-08 NOTE — ED Provider Notes (Signed)
[facility] Emergency Department Provider Note  ___________________________________________   First MD Initiated Contact with Patient 04/08/17 1103     (approximate)  I have reviewed the triage vital signs and the nursing notes.   HISTORY  Chief Complaint Foot Pain  HPI Carrie Bishop is a 83 y.o. female is here complaining of left foot pain. Patient states that she woke at 4 AM with foot pain. Patient started states that she has tramadol for pain but has not taken any because "I don't want to be addicted". Patient has neuropathy and decreased elevation in her feet. She denies any injury and does not walk in her home barefooted. She does however where slipper socks. Currently she rates her pain as 9/10.   Past Medical History:  Diagnosis Date  . Depression   . Diabetes mellitus without complication ([place])   . Hip fracture [facility])    left March 2017  . Hypertension   . Neuropathy   . Osteoporosis   . Restless leg syndrome   . Stroke ([place])   . Tremor     Patient Active Problem List   Diagnosis Date Noted  . Closed intratrochanteric fracture of right femur 04/03/2016  . S/P ORIF (open reduction internal fixation) fracture 04/03/2016  . Diabetes ([place]) 04/03/2016  . Essential hypertension 04/03/2016  . Essential tremor 04/03/2016  . Acute posthemorrhagic anemia 04/03/2016  . Hyponatremia 04/03/2016  . Closed intertrochanteric fracture of femur ([place]) 03/30/2016  . Osteoporosis 11/07/2015  . Insomnia 11/07/2015  . Neuropathic pain 11/07/2015  . Depression 11/07/2015  . DM type 2 (diabetes mellitus, type 2) ([place]) 11/01/2015  . Intertrochanteric fracture of left hip ([place]) 10/20/2015  . Chronic renal insufficiency 10/20/2015  . Anemia 10/20/2015  . Closed left hip fracture ([place]) 10/20/2015  . Benign essential tremor 09/20/2015    Past Surgical History:  Procedure Laterality Date  . ABDOMINAL HYSTERECTOMY    . arm surgery    . INTRAMEDULLARY  (IM) NAIL INTERTROCHANTERIC Left 10/21/2015   Procedure: INTRAMEDULLARY (IM) NAIL INTERTROCHANTRIC LEFT LONG AFFIXUS;  Surgeon: Hessie Knows, MD;  Location: [facility] ORS;  Service: Orthopedics;  Laterality: Left;  . INTRAMEDULLARY (IM) NAIL INTERTROCHANTERIC Right 03/31/2016   Procedure: INTRAMEDULLARY (IM) NAIL INTERTROCHANTRIC;  Surgeon: Dereck Leep, MD;  Location: [facility] ORS;  Service: Orthopedics;  Laterality: Right;  . LEG SURGERY      Prior to Admission medications   Medication Sig Start Date End Date Taking? Authorizing Provider  alendronate (FOSAMAX) 70 MG tablet Take 70 mg by mouth once a week. Take with a full glass of water on an empty stomach. On Fridays.    [provider]  atorvastatin (LIPITOR) 10 MG tablet Take 1 tablet (10 mg total) by mouth at bedtime. 04/03/16   Theodoro Grist, MD  baclofen (LIORESAL) 10 MG tablet Take 1 tablet (10 mg total) by mouth every 8 (eight) hours as needed for muscle spasms. 04/03/16   Theodoro Grist, MD  cholecalciferol (VITAMIN D) 1000 units tablet Take 1,000 Units by mouth daily.    [provider]  docusate sodium (COLACE) 100 MG capsule Take 1 capsule (100 mg total) by mouth 2 (two) times daily. 04/03/16   Theodoro Grist, MD  DULoxetine (CYMBALTA) 60 MG capsule Take 60 mg by mouth daily.     [provider]  enoxaparin (LOVENOX) 40 MG/0.4ML injection Inject 0.4 mLs (40 mg total) into the skin daily. 04/02/16 04/17/16  Duanne Guess, PA-C  ferrous sulfate 325 (65 FE)  MG tablet Take 1 tablet (325 mg total) by mouth 2 (two) times daily with a meal. 04/03/16   Theodoro Grist, MD  gabapentin (NEURONTIN) 600 MG tablet Take 600 mg by mouth 3 (three) times daily.    [provider]  lisinopril (PRINIVIL,ZESTRIL) 40 MG tablet Take 1 tablet by mouth at bedtime.  09/26/15   [provider]  oxyCODONE (OXY IR/ROXICODONE) 5 MG immediate release tablet Take 1-2 tablets (5-10 mg total) by mouth every 4 (four) hours as needed for  breakthrough pain ((for MODERATE breakthrough pain)). 04/02/16   Duanne Guess, PA-C  pantoprazole (PROTONIX) 40 MG tablet Take 1 tablet (40 mg total) by mouth 2 (two) times daily. 04/03/16   Theodoro Grist, MD  primidone (MYSOLINE) 50 MG tablet Take 100 mg by mouth at bedtime.     [provider]  propranolol ER (INDERAL LA) 80 MG 24 hr capsule Take 1 capsule (80 mg total) by mouth daily. 04/03/16   Theodoro Grist, MD  senna-docusate (SENOKOT-S) 8.6-50 MG tablet Take 1 tablet by mouth 2 (two) times daily. 04/03/16   Theodoro Grist, MD  traMADol (ULTRAM) 50 MG tablet Take 1-2 tablets (50-100 mg total) by mouth every 4 (four) hours as needed for moderate pain. 04/02/16   Duanne Guess, PA-C  traZODone (DESYREL) 100 MG tablet Take 100-200 mg by mouth at bedtime as needed for sleep.    [provider]    Allergies Patient has no known allergies.  Family History  Problem Relation Age of Onset  . Hypertension Other     Social History Social History  Substance Use Topics  . Smoking status: Never Smoker  . Smokeless tobacco: Never Used  . Alcohol use No    Review of Systems Constitutional: No fever/chills Cardiovascular: Denies chest pain. Respiratory: Denies shortness of breath. Musculoskeletal: Positive for left foot pain. Skin: Negative for rash. Neurological: Negative for focal weakness.  Positive for neuropathy.   ____________________________________________   PHYSICAL EXAM:  VITAL SIGNS: ED Triage Vitals  Enc Vitals Group     BP 04/08/17 1049 (!) 146/68     Pulse Rate 04/08/17 1049 87     Resp 04/08/17 1049 18     Temp 04/08/17 1049 98 F (36.7 C)     Temp Source 04/08/17 1049 Oral     SpO2 04/08/17 1049 97 %     Weight 04/08/17 1050 105 lb (47.6 kg)     Height 04/08/17 1050 5\' 6"  (1.676 m)     Head Circumference --      Peak Flow --      Pain Score 04/08/17 1049 9     Pain Loc --      Pain Edu? --      Excl. in [place]? --    Constitutional: Alert  and oriented. Well appearing and in no acute distress. Eyes: Conjunctivae are normal.  Head: Atraumatic. Neck: No stridor.   Cardiovascular: Normal rate, regular rhythm. Grossly normal heart sounds.  Good peripheral circulation. Respiratory: Normal respiratory effort.  No retractions. Lungs CTAB. Musculoskeletal: Examination of the left foot there is no gross deformity. There is some minimal tenderness on palpation generalized to the dorsal aspect. There is no point tenderness or puncture wounds noted to the plantar aspect. No warmth or redness. Neurologic:  Normal speech and language. Skin:  Skin is warm, dry and intact. As noted above. Psychiatric: Mood and affect are normal. Speech and behavior are normal.  ____________________________________________   LABS (all labs  ordered are listed, but only abnormal results are displayed)  Labs Reviewed - No data to display   RADIOLOGY  Dg Foot Complete Left  Result Date: 04/08/2017 CLINICAL DATA:  Acute left foot pain without known injury. EXAM: LEFT FOOT - COMPLETE 3+ VIEW COMPARISON:  None. FINDINGS: There is no evidence of fracture or dislocation. There is no evidence of arthropathy or other focal bone abnormality. Vascular calcifications are noted. IMPRESSION: No acute abnormality seen in the left foot. Electronically Signed   By: Marijo Conception, M.D.   On: 04/08/2017 11:52    ____________________________________________   PROCEDURES  Procedure(s) performed: None  Procedures  Critical Care performed: No  ____________________________________________   INITIAL IMPRESSION / ASSESSMENT AND PLAN / ED COURSE  Pertinent labs & imaging results that were available during my care of the patient were reviewed by me and considered in my medical decision making (see chart for details).  Patient was made aware that there was no fracture or foreign body in her foot. When asked if she would like to have something for pain daughter states  that she has her tramadol with her. We discussed her fear for getting addicted to tramadol. Daughter gave her one of her pills out of patient's bottle that was prescribed by Dr. Tobie Lords. Patient will follow-up with him if any continued problems with her foot.   ___________________________________________   FINAL CLINICAL IMPRESSION(S) / ED DIAGNOSES  Final diagnoses:  Left foot pain  Neuropathy      NEW MEDICATIONS STARTED DURING THIS VISIT:  Discharge Medication List as of 04/08/2017 12:22 PM       Note:  This document was prepared using Dragon voice recognition software and may include unintentional dictation errors.    Johnn Hai, PA-C 04/08/17 1318    Earleen Newport, MD 04/08/17 1340

## 2017-04-08 NOTE — Discharge Instructions (Signed)
Follow-up with your primary care doctor if any continued problems. Take tramadol as directed for pain.

## 2017-05-08 DIAGNOSIS — I69359 Hemiplegia and hemiparesis following cerebral infarction affecting unspecified side: Secondary | ICD-10-CM | POA: Diagnosis not present

## 2017-05-08 DIAGNOSIS — Z23 Encounter for immunization: Secondary | ICD-10-CM | POA: Diagnosis not present

## 2017-05-08 DIAGNOSIS — I10 Essential (primary) hypertension: Secondary | ICD-10-CM | POA: Diagnosis not present

## 2017-05-08 DIAGNOSIS — Z79899 Other long term (current) drug therapy: Secondary | ICD-10-CM | POA: Diagnosis not present

## 2017-05-08 DIAGNOSIS — M79604 Pain in right leg: Secondary | ICD-10-CM | POA: Diagnosis not present

## 2017-05-08 DIAGNOSIS — G250 Essential tremor: Secondary | ICD-10-CM | POA: Diagnosis not present

## 2017-05-08 DIAGNOSIS — E119 Type 2 diabetes mellitus without complications: Secondary | ICD-10-CM | POA: Diagnosis not present

## 2017-05-08 DIAGNOSIS — F329 Major depressive disorder, single episode, unspecified: Secondary | ICD-10-CM | POA: Diagnosis not present

## 2017-05-10 DIAGNOSIS — Z79899 Other long term (current) drug therapy: Secondary | ICD-10-CM | POA: Diagnosis not present

## 2017-05-22 DIAGNOSIS — M792 Neuralgia and neuritis, unspecified: Secondary | ICD-10-CM | POA: Diagnosis not present

## 2017-05-22 DIAGNOSIS — G308 Other Alzheimer's disease: Secondary | ICD-10-CM | POA: Diagnosis not present

## 2017-05-22 DIAGNOSIS — F0280 Dementia in other diseases classified elsewhere without behavioral disturbance: Secondary | ICD-10-CM | POA: Diagnosis not present

## 2017-05-22 DIAGNOSIS — R269 Unspecified abnormalities of gait and mobility: Secondary | ICD-10-CM | POA: Diagnosis not present

## 2017-05-22 DIAGNOSIS — Z8659 Personal history of other mental and behavioral disorders: Secondary | ICD-10-CM | POA: Diagnosis not present

## 2017-05-22 DIAGNOSIS — G250 Essential tremor: Secondary | ICD-10-CM | POA: Diagnosis not present

## 2017-05-28 DIAGNOSIS — H524 Presbyopia: Secondary | ICD-10-CM | POA: Diagnosis not present

## 2017-06-03 ENCOUNTER — Encounter (INDEPENDENT_AMBULATORY_CARE_PROVIDER_SITE_OTHER): Payer: Medicare HMO | Admitting: Ophthalmology

## 2017-06-03 DIAGNOSIS — H34812 Central retinal vein occlusion, left eye, with macular edema: Secondary | ICD-10-CM | POA: Diagnosis not present

## 2017-06-03 DIAGNOSIS — H43813 Vitreous degeneration, bilateral: Secondary | ICD-10-CM

## 2017-06-03 DIAGNOSIS — H353132 Nonexudative age-related macular degeneration, bilateral, intermediate dry stage: Secondary | ICD-10-CM

## 2017-06-03 DIAGNOSIS — H35033 Hypertensive retinopathy, bilateral: Secondary | ICD-10-CM

## 2017-06-03 DIAGNOSIS — I10 Essential (primary) hypertension: Secondary | ICD-10-CM

## 2017-06-17 ENCOUNTER — Observation Stay: Payer: Medicare HMO

## 2017-06-17 ENCOUNTER — Emergency Department: Payer: Medicare HMO

## 2017-06-17 ENCOUNTER — Other Ambulatory Visit: Payer: Self-pay

## 2017-06-17 ENCOUNTER — Observation Stay
Admission: EM | Admit: 2017-06-17 | Discharge: 2017-06-18 | Disposition: A | Discharge status: Home / Self Care | Payer: Medicare HMO | Attending: Internal Medicine | Admitting: Internal Medicine

## 2017-06-17 ENCOUNTER — Observation Stay (HOSPITAL_BASED_OUTPATIENT_CLINIC_OR_DEPARTMENT_OTHER)
Admit: 2017-06-17 | Discharge: 2017-06-17 | Disposition: A | Discharge status: Home / Self Care | Payer: Medicare HMO | Attending: Physician Assistant | Admitting: Physician Assistant

## 2017-06-17 DIAGNOSIS — D62 Acute posthemorrhagic anemia: Secondary | ICD-10-CM | POA: Diagnosis not present

## 2017-06-17 DIAGNOSIS — E1140 Type 2 diabetes mellitus with diabetic neuropathy, unspecified: Secondary | ICD-10-CM | POA: Diagnosis not present

## 2017-06-17 DIAGNOSIS — G250 Essential tremor: Secondary | ICD-10-CM | POA: Insufficient documentation

## 2017-06-17 DIAGNOSIS — R0789 Other chest pain: Secondary | ICD-10-CM | POA: Diagnosis not present

## 2017-06-17 DIAGNOSIS — I10 Essential (primary) hypertension: Secondary | ICD-10-CM | POA: Diagnosis not present

## 2017-06-17 DIAGNOSIS — E1122 Type 2 diabetes mellitus with diabetic chronic kidney disease: Secondary | ICD-10-CM | POA: Insufficient documentation

## 2017-06-17 DIAGNOSIS — N189 Chronic kidney disease, unspecified: Secondary | ICD-10-CM | POA: Insufficient documentation

## 2017-06-17 DIAGNOSIS — I129 Hypertensive chronic kidney disease with stage 1 through stage 4 chronic kidney disease, or unspecified chronic kidney disease: Secondary | ICD-10-CM | POA: Diagnosis not present

## 2017-06-17 DIAGNOSIS — I639 Cerebral infarction, unspecified: Secondary | ICD-10-CM | POA: Diagnosis not present

## 2017-06-17 DIAGNOSIS — M810 Age-related osteoporosis without current pathological fracture: Secondary | ICD-10-CM | POA: Diagnosis not present

## 2017-06-17 DIAGNOSIS — I700 Atherosclerosis of aorta: Secondary | ICD-10-CM | POA: Insufficient documentation

## 2017-06-17 DIAGNOSIS — F0390 Unspecified dementia without behavioral disturbance: Secondary | ICD-10-CM | POA: Diagnosis not present

## 2017-06-17 DIAGNOSIS — R29818 Other symptoms and signs involving the nervous system: Secondary | ICD-10-CM | POA: Diagnosis not present

## 2017-06-17 DIAGNOSIS — E785 Hyperlipidemia, unspecified: Secondary | ICD-10-CM | POA: Diagnosis not present

## 2017-06-17 DIAGNOSIS — G2581 Restless legs syndrome: Secondary | ICD-10-CM | POA: Diagnosis not present

## 2017-06-17 DIAGNOSIS — Z8249 Family history of ischemic heart disease and other diseases of the circulatory system: Secondary | ICD-10-CM | POA: Diagnosis not present

## 2017-06-17 DIAGNOSIS — I340 Nonrheumatic mitral (valve) insufficiency: Secondary | ICD-10-CM | POA: Insufficient documentation

## 2017-06-17 DIAGNOSIS — R079 Chest pain, unspecified: Secondary | ICD-10-CM | POA: Diagnosis not present

## 2017-06-17 DIAGNOSIS — G459 Transient cerebral ischemic attack, unspecified: Secondary | ICD-10-CM

## 2017-06-17 DIAGNOSIS — I6523 Occlusion and stenosis of bilateral carotid arteries: Secondary | ICD-10-CM | POA: Insufficient documentation

## 2017-06-17 DIAGNOSIS — E119 Type 2 diabetes mellitus without complications: Secondary | ICD-10-CM | POA: Diagnosis not present

## 2017-06-17 DIAGNOSIS — Z794 Long term (current) use of insulin: Secondary | ICD-10-CM | POA: Insufficient documentation

## 2017-06-17 DIAGNOSIS — Z7982 Long term (current) use of aspirin: Secondary | ICD-10-CM | POA: Diagnosis not present

## 2017-06-17 DIAGNOSIS — E871 Hypo-osmolality and hyponatremia: Secondary | ICD-10-CM | POA: Insufficient documentation

## 2017-06-17 DIAGNOSIS — I69351 Hemiplegia and hemiparesis following cerebral infarction affecting right dominant side: Secondary | ICD-10-CM | POA: Diagnosis not present

## 2017-06-17 DIAGNOSIS — R200 Anesthesia of skin: Secondary | ICD-10-CM

## 2017-06-17 DIAGNOSIS — F329 Major depressive disorder, single episode, unspecified: Secondary | ICD-10-CM | POA: Insufficient documentation

## 2017-06-17 DIAGNOSIS — Z90710 Acquired absence of both cervix and uterus: Secondary | ICD-10-CM | POA: Diagnosis not present

## 2017-06-17 DIAGNOSIS — Z79899 Other long term (current) drug therapy: Secondary | ICD-10-CM | POA: Diagnosis not present

## 2017-06-17 HISTORY — DX: Chest pain, unspecified: R07.9

## 2017-06-17 LAB — TSH: TSH: 1.93 u[IU]/mL (ref 0.350–4.500)

## 2017-06-17 LAB — BASIC METABOLIC PANEL
Anion gap: 13 (ref 5–15)
BUN: 29 mg/dL — AB (ref 6–20)
CALCIUM: 9.9 mg/dL (ref 8.9–10.3)
CO2: 27 mmol/L (ref 22–32)
Chloride: 93 mmol/L — ABNORMAL LOW (ref 101–111)
Creatinine, Ser: 0.74 mg/dL (ref 0.44–1.00)
GFR calc Af Amer: >60 mL/min (ref >60)
GLUCOSE: 160 mg/dL — AB (ref 65–99)
Potassium: 3.9 mmol/L (ref 3.5–5.1)
Sodium: 133 mmol/L — ABNORMAL LOW (ref 135–145)

## 2017-06-17 LAB — LIPID PANEL
CHOL/HDL RATIO: 2.6 ratio
CHOLESTEROL: 162 mg/dL (ref 0–200)
HDL: 62 mg/dL (ref >40)
LDL Cholesterol: 80 mg/dL (ref 0–99)
Triglycerides: 99 mg/dL (ref <150)
VLDL: 20 mg/dL (ref 0–40)

## 2017-06-17 LAB — GLUCOSE, CAPILLARY
Glucose-Capillary: 223 mg/dL — ABNORMAL HIGH (ref 65–99)
Glucose-Capillary: 227 mg/dL — ABNORMAL HIGH (ref 65–99)
Glucose-Capillary: 215 mg/dL — ABNORMAL HIGH (ref 65–99)

## 2017-06-17 LAB — CBC
HEMATOCRIT: 40.2 % (ref 35.0–47.0)
Hemoglobin: 13.8 g/dL (ref 12.0–16.0)
MCH: 31.4 pg (ref 26.0–34.0)
MCHC: 34.3 g/dL (ref 32.0–36.0)
MCV: 91.5 fL (ref 80.0–100.0)
Platelets: 234 10*3/uL (ref 150–440)
RBC: 4.39 MIL/uL (ref 3.80–5.20)
RDW: 13.8 % (ref 11.5–14.5)
WBC: 7.7 10*3/uL (ref 3.6–11.0)

## 2017-06-17 LAB — ECHOCARDIOGRAM COMPLETE
Height: 66 in
Weight: 1664 oz

## 2017-06-17 LAB — TROPONIN I
Troponin I: <0.03 ng/mL (ref <0.03)
Troponin I: <0.03 ng/mL (ref <0.03)
Troponin I: <0.03 ng/mL (ref <0.03)

## 2017-06-17 LAB — HEMOGLOBIN A1C
Hgb A1c MFr Bld: 7.3 % — ABNORMAL HIGH (ref 4.8–5.6)
Mean Plasma Glucose: 162.81 mg/dL

## 2017-06-17 LAB — MAGNESIUM: MAGNESIUM: 1.7 mg/dL (ref 1.7–2.4)

## 2017-06-17 MED ORDER — PRAVASTATIN SODIUM 40 MG PO TABS
40.0000 mg | ORAL_TABLET | Freq: Every day | ORAL | Status: DC
  Administered 2017-06-17 – 2017-06-18 (×2): 40 mg via ORAL
  Filled 2017-06-17 (×2): qty 1

## 2017-06-17 MED ORDER — PRIMIDONE 50 MG PO TABS
150.0000 mg | ORAL_TABLET | Freq: Every day | ORAL | Status: DC
  Administered 2017-06-17: 150 mg via ORAL
  Filled 2017-06-17 (×2): qty 3

## 2017-06-17 MED ORDER — ADULT MULTIVITAMIN W/MINERALS CH
1.0000 | ORAL_TABLET | Freq: Every day | ORAL | Status: DC
  Administered 2017-06-17 – 2017-06-18 (×2): 1 via ORAL
  Filled 2017-06-17 (×2): qty 1

## 2017-06-17 MED ORDER — INSULIN ASPART 100 UNIT/ML ~~LOC~~ SOLN
0.0000 [IU] | Freq: Three times a day (TID) | SUBCUTANEOUS | Status: DC
  Administered 2017-06-17 (×2): 3 [IU] via SUBCUTANEOUS
  Administered 2017-06-18: 5 [IU] via SUBCUTANEOUS
  Administered 2017-06-18: 2 [IU] via SUBCUTANEOUS
  Filled 2017-06-17 (×4): qty 1

## 2017-06-17 MED ORDER — ONDANSETRON HCL 4 MG/2ML IJ SOLN: 4.0000 mg | Freq: Four times a day (QID) | INTRAMUSCULAR | Status: DC | PRN

## 2017-06-17 MED ORDER — LISINOPRIL 20 MG PO TABS
40.0000 mg | ORAL_TABLET | Freq: Every day | ORAL | Status: DC
  Administered 2017-06-17: 40 mg via ORAL
  Filled 2017-06-17: qty 2

## 2017-06-17 MED ORDER — VITAMIN D 1000 UNITS PO TABS
1000.0000 [IU] | ORAL_TABLET | Freq: Every day | ORAL | Status: DC
  Administered 2017-06-17 – 2017-06-18 (×2): 1000 [IU] via ORAL
  Filled 2017-06-17 (×2): qty 1

## 2017-06-17 MED ORDER — NITROGLYCERIN 2 % TD OINT
0.5000 [in_us] | TOPICAL_OINTMENT | Freq: Four times a day (QID) | TRANSDERMAL | Status: DC
  Administered 2017-06-17: 0.5 [in_us] via TOPICAL
  Filled 2017-06-17 (×2): qty 1

## 2017-06-17 MED ORDER — ASPIRIN 81 MG PO CHEW
324.0000 mg | CHEWABLE_TABLET | Freq: Once | ORAL | Status: AC
  Administered 2017-06-17: 324 mg via ORAL
  Filled 2017-06-17: qty 4

## 2017-06-17 MED ORDER — GABAPENTIN 600 MG PO TABS
600.0000 mg | ORAL_TABLET | Freq: Three times a day (TID) | ORAL | Status: DC
  Administered 2017-06-17 – 2017-06-18 (×4): 600 mg via ORAL
  Filled 2017-06-17 (×4): qty 1

## 2017-06-17 MED ORDER — METOPROLOL TARTRATE 25 MG PO TABS
12.5000 mg | ORAL_TABLET | Freq: Two times a day (BID) | ORAL | Status: DC
  Administered 2017-06-17 – 2017-06-18 (×2): 12.5 mg via ORAL
  Filled 2017-06-17 (×2): qty 1

## 2017-06-17 MED ORDER — ASPIRIN EC 325 MG PO TBEC
325.0000 mg | DELAYED_RELEASE_TABLET | Freq: Every day | ORAL | Status: DC
  Administered 2017-06-17 – 2017-06-18 (×2): 325 mg via ORAL
  Filled 2017-06-17 (×2): qty 1

## 2017-06-17 MED ORDER — ENSURE ENLIVE PO LIQD
237.0000 mL | Freq: Three times a day (TID) | ORAL | Status: DC
  Administered 2017-06-17 – 2017-06-18 (×3): 237 mL via ORAL

## 2017-06-17 MED ORDER — CARBIDOPA-LEVODOPA 25-100 MG PO TABS
1.0000 | ORAL_TABLET | Freq: Three times a day (TID) | ORAL | Status: DC
  Administered 2017-06-17 – 2017-06-18 (×4): 1 via ORAL
  Filled 2017-06-17 (×7): qty 1

## 2017-06-17 MED ORDER — ACETAMINOPHEN 325 MG PO TABS
650.0000 mg | ORAL_TABLET | Freq: Four times a day (QID) | ORAL | Status: DC | PRN
  Administered 2017-06-17: 650 mg via ORAL

## 2017-06-17 MED ORDER — ENOXAPARIN SODIUM 40 MG/0.4ML ~~LOC~~ SOLN
40.0000 mg | SUBCUTANEOUS | Status: DC
  Administered 2017-06-17: 40 mg via SUBCUTANEOUS
  Filled 2017-06-17: qty 0.4

## 2017-06-17 MED ORDER — TRAMADOL HCL 50 MG PO TABS
50.0000 mg | ORAL_TABLET | ORAL | Status: DC | PRN
  Administered 2017-06-17 (×2): 100 mg via ORAL
  Administered 2017-06-17: 50 mg via ORAL
  Filled 2017-06-17: qty 1
  Filled 2017-06-17 (×2): qty 2

## 2017-06-17 MED ORDER — DULOXETINE HCL 30 MG PO CPEP
60.0000 mg | ORAL_CAPSULE | Freq: Every day | ORAL | Status: DC
  Administered 2017-06-17 – 2017-06-18 (×2): 60 mg via ORAL
  Filled 2017-06-17 (×2): qty 2

## 2017-06-17 MED ORDER — INSULIN ASPART 100 UNIT/ML ~~LOC~~ SOLN
0.0000 [IU] | Freq: Every day | SUBCUTANEOUS | Status: DC
  Administered 2017-06-17: 2 [IU] via SUBCUTANEOUS
  Filled 2017-06-17: qty 1

## 2017-06-17 MED ORDER — DONEPEZIL HCL 5 MG PO TABS
5.0000 mg | ORAL_TABLET | Freq: Every day | ORAL | Status: DC
  Administered 2017-06-17: 5 mg via ORAL
  Filled 2017-06-17 (×2): qty 1

## 2017-06-17 MED ORDER — DOCUSATE SODIUM 100 MG PO CAPS
100.0000 mg | ORAL_CAPSULE | Freq: Two times a day (BID) | ORAL | Status: DC
  Administered 2017-06-17 – 2017-06-18 (×3): 100 mg via ORAL
  Filled 2017-06-17 (×3): qty 1

## 2017-06-17 MED ORDER — ONDANSETRON HCL 4 MG PO TABS: 4.0000 mg | ORAL_TABLET | Freq: Four times a day (QID) | ORAL | Status: DC | PRN

## 2017-06-17 MED ORDER — ACETAMINOPHEN 650 MG RE SUPP
650.0000 mg | Freq: Four times a day (QID) | RECTAL | Status: DC | PRN
Start: 2017-06-17 — End: 2017-06-18

## 2017-06-17 NOTE — Consult Note (Signed)
[facility] TeleNeurology Consult Services  Asked to see this patient in telemedicine consultation. Consultation was performed with assistance of ancillary/medical staff at bedside.  Comments: Last Known normal 2100 Door Time: 048 [facility] Contacted: 120 [facility] first log in: 126 NIHSS assessment time: 136 Needle Time: no iv tpa Call back time: 136  HPI: 75 yof with hx of stroke in 1991, essential tremors, presents with day long chest pain that progressed to RUE weakness and numbness. She has baseline weakness in her right leg d/t multiple surgeries.  She has resolution of her RUE weakness but has numbness still. Ct scan head with no acute finding per my review.  She has headache as well this evening and still with chest discomfort.    VSS  Gen Wn/Wd in Nad  TeleStroke Assessment: LOC:   0 LOC questions:  0 LOC Commands :   0 Gaze : 0 Visual fields :  0  Facial movements : 0 Upper limb Motor  0 Lower limb Motor  0 (R leg baseline weakness) Limb Coordination  - 0 Sensory -  - 1 Language -  0 Speech -   0 Neglect / extinction -  0  NIHSS Score: 1    IMPRESSION  Small L Hemispheric Stroke  Medical Decision Making:   Patient is not candidate for alteplase due to nondisabling sxs and onset of sxs greater than 4.5hrs  Not an IR candidate as low clinical suspicion for LVO by neurologic assessment.   Recommendations: 1- Daily antithrombotics to initiate now if no contraindication.  2- Further work up with Stroke labs, MRI brain, ECHO, NIVS Carotid will be deferred to inpt neurology service 3-  Needs Inpatient Neurology consultation and follow up  4- Thank you for allowing [place] to participate in the care of your patient, if there are any questions please don't hesitate to contact us  Discussed plan of care with patient/hospital staff   Physician: Sylvan Cheese, DO   [facility]

## 2017-06-17 NOTE — H&P (Signed)
Carrie Bishop is an 83 y.o. female.   Chief Complaint: Numbness HPI: The patient with past medical history of hypertension, diabetes and history of stroke presents to the emergency department complaining of numbness.  The patient not completely clear on the onset of her perceived numbness in her right arm.  Upon arrival to the emergency department code stroke was initiated and the patient underwent a CT scan of the head which did not demonstrate acute problems.  Telemetry neurology evaluated the patient and could not confirm if her symptoms were indicative of stroke but did recommend workup of potential cardiac etiology to the arm pain.  Indeed the patient endorses some chest pain although onset is again hard to pinpoint.  She denies radiation of the pain but admits that it began around the same time as her arm numbness.  She also admits to nausea but denies vomiting or diaphoresis.  She was found to be stable in the emergency department called the hospitalist service for further evaluation.  Past Medical History:  Diagnosis Date  . Depression   . Diabetes mellitus without complication ([place])   . Hip fracture [facility])    left March 2017  . Hypertension   . Neuropathy   . Osteoporosis   . Restless leg syndrome   . Stroke ([place])   . Tremor     Past Surgical History:  Procedure Laterality Date  . ABDOMINAL HYSTERECTOMY    . arm surgery    . INTRAMEDULLARY (IM) NAIL INTERTROCHANTRIC Right 03/31/2016   Performed by Dereck Leep, MD at [facility] ORS  . INTRAMEDULLARY (IM) NAIL INTERTROCHANTRIC LEFT LONG AFFIXUS Left 10/21/2015   Performed by Hessie Knows, MD at [facility] ORS  . LEG SURGERY      Family History  Problem Relation Age of Onset  . Hypertension Other    Social History:  reports that  has never smoked. she has never used smokeless tobacco. She reports that she does not drink alcohol or use drugs.  Allergies: No Known Allergies  Medications Prior to Admission  Medication Sig Dispense  Refill  . acetaminophen (TYLENOL) 325 MG tablet Take 650 mg every 4 (four) hours as needed by mouth for mild pain.    Marland Kitchen alendronate (FOSAMAX) 70 MG tablet Take 70 mg by mouth once a week. Take with a full glass of water on an empty stomach. On Fridays.    . carbidopa-levodopa (SINEMET IR) 25-100 MG tablet Take 1 tablet 3 (three) times daily by mouth.    . cholecalciferol (VITAMIN D) 1000 units tablet Take 1,000 Units by mouth daily.    Marland Kitchen donepezil (ARICEPT) 5 MG tablet Take 5 mg at bedtime by mouth.    . DULoxetine (CYMBALTA) 60 MG capsule Take 60 mg by mouth daily.     Marland Kitchen gabapentin (NEURONTIN) 600 MG tablet Take 600 mg by mouth 3 (three) times daily.    Marland Kitchen lisinopril (PRINIVIL,ZESTRIL) 40 MG tablet Take 1 tablet by mouth at bedtime.     . metFORMIN (GLUCOPHAGE-XR) 500 MG 24 hr tablet Take 1,500 mg daily by mouth.    . pravastatin (PRAVACHOL) 40 MG tablet Take 40 mg daily by mouth.    . primidone (MYSOLINE) 50 MG tablet Take 150 mg at bedtime by mouth.     . traMADol (ULTRAM) 50 MG tablet Take 1-2 tablets (50-100 mg total) by mouth every 4 (four) hours as needed for moderate pain. 30 tablet 0    Results for orders placed or performed during the hospital  encounter of 06/17/17 (from the past 48 hour(s))  Basic metabolic panel     Status: Abnormal   Collection Time: 06/17/17  1:11 AM  Result Value Ref Range   Sodium 133 (L) 135 - 145 mmol/L   Potassium 3.9 3.5 - 5.1 mmol/L   Chloride 93 (L) 101 - 111 mmol/L   CO2 27 22 - 32 mmol/L   Glucose, Bld 160 (H) 65 - 99 mg/dL   BUN 29 (H) 6 - 20 mg/dL   Creatinine, Ser 0.74 0.44 - 1.00 mg/dL   Calcium 9.9 8.9 - 10.3 mg/dL   GFR calc non Af Amer >60 >60 mL/min   GFR calc Af Amer >60 >60 mL/min    Comment: (NOTE) The eGFR has been calculated using the CKD EPI equation. This calculation has not been validated in all clinical situations. eGFR's persistently <60 mL/min signify possible Chronic Kidney Disease.    Anion gap 13 5 - 15  CBC     Status:  None   Collection Time: 06/17/17  1:11 AM  Result Value Ref Range   WBC 7.7 3.6 - 11.0 K/uL   RBC 4.39 3.80 - 5.20 MIL/uL   Hemoglobin 13.8 12.0 - 16.0 g/dL   HCT 40.2 35.0 - 47.0 %   MCV 91.5 80.0 - 100.0 fL   MCH 31.4 26.0 - 34.0 pg   MCHC 34.3 32.0 - 36.0 g/dL   RDW 13.8 11.5 - 14.5 %   Platelets 234 150 - 440 K/uL  Troponin I     Status: None   Collection Time: 06/17/17  1:11 AM  Result Value Ref Range   Troponin I <0.03 <0.03 ng/mL  TSH     Status: None   Collection Time: 06/17/17  4:54 AM  Result Value Ref Range   TSH 1.930 0.350 - 4.500 uIU/mL    Comment: Performed by a 3rd Generation assay with a functional sensitivity of <=0.01 uIU/mL.  Troponin I     Status: None   Collection Time: 06/17/17  4:54 AM  Result Value Ref Range   Troponin I <0.03 <0.03 ng/mL   Dg Chest 2 View  Result Date: 06/17/2017 CLINICAL DATA:  Initial evaluation for acute chest pain, right arm numbness. EXAM: CHEST  2 VIEW COMPARISON:  Prior radiograph from 03/30/2016. FINDINGS: Patient is rotated to the left. Transverse heart size within normal limits. Mediastinal silhouette within normal limits. Aortic atherosclerosis. Lungs normally inflated. No focal infiltrates. No pulmonary edema or pleural effusion. No pneumothorax. Mild height loss at the superior endplate of T5, somewhat age indeterminate, but favored to be chronic. No other acute osseous abnormality. IMPRESSION: 1. No active cardiopulmonary disease. 2. Aortic atherosclerosis. 3. Mild height loss at the superior endplate of T5, somewhat age indeterminate, but favored to be chronic. Correlation with physical exam recommended. Electronically Signed   By: Jeannine Boga M.D.   On: 06/17/2017 02:34   Ct Head Wo Contrast  Result Date: 06/17/2017 CLINICAL DATA:  Initial evaluation for acute right arm numbness. EXAM: CT HEAD WITHOUT CONTRAST TECHNIQUE: Contiguous axial images were obtained from the base of the skull through the vertex without  intravenous contrast. COMPARISON:  Prior MRI from 11/27/2016. FINDINGS: Brain: Moderate cerebral atrophy with chronic small vessel ischemic disease. Scatter remote lacunar infarct present within the bilateral basal ganglia and thalami. Small remote left cerebellar infarct noted. No acute intracranial hemorrhage. No acute large vessel territory infarct. No mass lesion, midline shift or mass effect. No hydrocephalus. No extra-axial fluid collection. Vascular:  No asymmetric hyperdense vessel. Prominent calcified atherosclerosis at the skullbase. Skull: Scalp soft tissues and calvarium within normal limits. Sinuses/Orbits: Globes oral soft tissues within normal limits. Patient status post lens extraction bilaterally. Paranasal sinuses are clear. Right mastoid effusion noted. Other: None. IMPRESSION: 1. No acute intracranial abnormality. 2. Moderate atrophy with chronic small vessel ischemic disease. 3. Right mastoid effusion. Electronically Signed   By: Jeannine Boga M.D.   On: 06/17/2017 01:29    Review of Systems  Constitutional: Negative for chills and fever.  HENT: Negative for sore throat and tinnitus.   Eyes: Negative for blurred vision and redness.  Respiratory: Negative for cough and shortness of breath.   Cardiovascular: Positive for chest pain. Negative for palpitations, orthopnea and PND.  Gastrointestinal: Positive for nausea. Negative for abdominal pain, diarrhea and vomiting.  Genitourinary: Negative for dysuria, frequency and urgency.  Musculoskeletal: Negative for joint pain and myalgias.  Skin: Negative for rash.       No lesions  Neurological: Positive for sensory change. Negative for speech change, focal weakness and weakness.  Endo/Heme/Allergies: Does not bruise/bleed easily.       No temperature intolerance  Psychiatric/Behavioral: Negative for depression and suicidal ideas.    Blood pressure (!) 187/81, pulse 100, temperature 98.1 F (36.7 C), temperature source Oral,  resp. rate 18, height '5\' 6"'  (1.676 m), weight 47.2 kg (104 lb), SpO2 99 %. Physical Exam  Vitals reviewed. Constitutional: She is oriented to person, place, and time. She appears well-developed and well-nourished. No distress.  HENT:  Head: Normocephalic and atraumatic.  Mouth/Throat: Oropharynx is clear and moist.  Eyes: Conjunctivae and EOM are normal. Pupils are equal, round, and reactive to light. No scleral icterus.  Neck: Normal range of motion. Neck supple. No JVD present. No tracheal deviation present. No thyromegaly present.  Cardiovascular: Normal rate, regular rhythm and normal heart sounds. Exam reveals no gallop and no friction rub.  No murmur heard. Respiratory: Effort normal and breath sounds normal.  GI: Soft. Bowel sounds are normal. She exhibits no distension. There is no tenderness.  Genitourinary:  Genitourinary Comments: Deferred  Musculoskeletal: Normal range of motion. She exhibits deformity (right arm s/p remote repair). She exhibits no edema.  Lymphadenopathy:    She has no cervical adenopathy.  Neurological: She is alert and oriented to person, place, and time. No cranial nerve deficit. She exhibits normal muscle tone.  Skin: Skin is warm and dry. No rash noted. No erythema.  Psychiatric: She has a normal mood and affect. Her behavior is normal. Judgment and thought content normal.     Assessment/Plan This is an 83 year old female admitted for chest pain. 1.  Chest pain: Unclear if related to TIA symptoms.  Patient has difficulty pinpointing her describing pain but states that her chest hurts less than it did prior to arrival to the emergency department.  No signs of ischemia on EKG.  Continue to follow cardiac biomarkers.  Monitor telemetry.  Consult cardiology. 2.  Numbness: Tele-neurology has not recommended TPA.  It is unclear if her symptoms represent stroke.  The patient already has a deformity of her right arm from trauma many years ago and also has  neuropathy.  Further workup at the discretion of the primary team. 3.  Essential hypertension: Uncontrolled; I have applied Nitropaste to the patient's chest for diastolic pressures greater than 100.  Given concern for TIA we will allow permissive hypertension for now. 4.  Diabetes mellitus type 2: Hold metformin in case further imaging studies  require contrast.  Sliding scale insulin while hospitalized.  Cymbalta and gabapentin for neuropathy.  5.  Hyperlipidemia: Continue statin therapy 6.  Tremor: Parkinsonian; continue Sinemet and primidone 7.  Dementia: Continue Aricept 8.  DVT prophylaxis: Lovenox 9.  GI prophylaxis: None The patient is a full code.  Time spent on admission orders and patient care approximately 45 minutes  Harrie Foreman, MD 06/17/2017, 6:32 AM

## 2017-06-17 NOTE — Care Management Obs Status (Signed)
[place] NOTIFICATION   Patient Details  Name: Carrie Bishop MRN: [id number] Date of Birth: 06/14/1929   Medicare Observation Status Notification Given:  Yes Patient marked with "x"- due to tremors, original given to patient and a copy of same HIM for scanning  Katrina Stack, RN 06/17/2017, 5:39 PM

## 2017-06-17 NOTE — ED Provider Notes (Signed)
[facility] Emergency Department Provider Note   ____________________________________________   First MD Initiated Contact with Patient 06/17/17 0130     (approximate)  I have reviewed the triage vital signs and the nursing notes.   HISTORY  Chief Complaint Chest Pain and Numbness    HPI AMIAYA Bishop is a 83 y.o. female brought to the ED from home by her daughter with a chief complaint of chest pain and right arm numbness.  Patient has dementia, and as far as the daughter knows, she has been experiencing chest pain since the afternoon.  She called her daughter at 11:30 PM stating she was having right arm numbness and weakness in that hand.  Upon further interview, patient is uncertain of time of onset and it could have been as early as 9 PM.  Denies associated fever, chills, diaphoresis, shortness of breath, abdominal pain, nausea, vomiting.  Denies recent travel or trauma.   Past Medical History:  Diagnosis Date  . Depression   . Diabetes mellitus without complication ([place])   . Hip fracture [facility])    left March 2017  . Hypertension   . Neuropathy   . Osteoporosis   . Restless leg syndrome   . Stroke ([place])   . Tremor     Patient Active Problem List   Diagnosis Date Noted  . Closed intratrochanteric fracture of right femur 04/03/2016  . S/P ORIF (open reduction internal fixation) fracture 04/03/2016  . Diabetes ([place]) 04/03/2016  . Essential hypertension 04/03/2016  . Essential tremor 04/03/2016  . Acute posthemorrhagic anemia 04/03/2016  . Hyponatremia 04/03/2016  . Closed intertrochanteric fracture of femur ([place]) 03/30/2016  . Osteoporosis 11/07/2015  . Insomnia 11/07/2015  . Neuropathic pain 11/07/2015  . Depression 11/07/2015  . DM type 2 (diabetes mellitus, type 2) ([place]) 11/01/2015  . Intertrochanteric fracture of left hip ([place]) 10/20/2015  . Chronic renal insufficiency 10/20/2015  . Anemia 10/20/2015  . Closed left hip fracture  ([place]) 10/20/2015  . Benign essential tremor 09/20/2015    Past Surgical History:  Procedure Laterality Date  . ABDOMINAL HYSTERECTOMY    . arm surgery    . INTRAMEDULLARY (IM) NAIL INTERTROCHANTRIC Right 03/31/2016   Performed by Dereck Leep, MD at [facility] ORS  . INTRAMEDULLARY (IM) NAIL INTERTROCHANTRIC LEFT LONG AFFIXUS Left 10/21/2015   Performed by Hessie Knows, MD at [facility] ORS  . LEG SURGERY      Prior to Admission medications   Medication Sig Start Date End Date Taking? Authorizing Provider  acetaminophen (TYLENOL) 325 MG tablet Take 650 mg every 4 (four) hours as needed by mouth for mild pain.   Yes [provider]  alendronate (FOSAMAX) 70 MG tablet Take 70 mg by mouth once a week. Take with a full glass of water on an empty stomach. On Fridays.   Yes [provider]  carbidopa-levodopa (SINEMET IR) 25-100 MG tablet Take 1 tablet 3 (three) times daily by mouth. 05/27/17  Yes [provider]  cholecalciferol (VITAMIN D) 1000 units tablet Take 1,000 Units by mouth daily.   Yes [provider]  donepezil (ARICEPT) 5 MG tablet Take 5 mg at bedtime by mouth. 06/03/17  Yes [provider]  DULoxetine (CYMBALTA) 60 MG capsule Take 60 mg by mouth daily.    Yes [provider]  gabapentin (NEURONTIN) 600 MG tablet Take 600 mg by mouth 3 (three) times daily.   Yes [provider]  lisinopril (PRINIVIL,ZESTRIL) 40 MG tablet Take 1 tablet  by mouth at bedtime.  09/26/15  Yes [provider]  metFORMIN (GLUCOPHAGE-XR) 500 MG 24 hr tablet Take 1,500 mg daily by mouth. 05/27/17  Yes [provider]  pravastatin (PRAVACHOL) 40 MG tablet Take 40 mg daily by mouth. 06/05/17  Yes [provider]  primidone (MYSOLINE) 50 MG tablet Take 150 mg at bedtime by mouth.    Yes [provider]  traMADol (ULTRAM) 50 MG tablet Take 1-2 tablets (50-100 mg total) by mouth every 4 (four) hours as needed for moderate pain.  04/02/16  Yes Duanne Guess, PA-C    Allergies Patient has no known allergies.  Family History  Problem Relation Age of Onset  . Hypertension Other     Social History Social History   Tobacco Use  . Smoking status: Never Smoker  . Smokeless tobacco: Never Used  Substance Use Topics  . Alcohol use: No  . Drug use: No    Review of Systems  Constitutional: No fever/chills. Eyes: No visual changes. ENT: No sore throat. Cardiovascular: Positive for chest pain. Respiratory: Denies shortness of breath. Gastrointestinal: No abdominal pain.  No nausea, no vomiting.  No diarrhea.  No constipation. Genitourinary: Negative for dysuria. Musculoskeletal: Negative for back pain. Skin: Negative for rash. Neurological: Negative for headaches, focal weakness or numbness.   ____________________________________________   PHYSICAL EXAM:  VITAL SIGNS: ED Triage Vitals  Enc Vitals Group     BP 06/17/17 0055 (!) 187/109     Pulse Rate 06/17/17 0055 (!) 114     Resp 06/17/17 0055 18     Temp 06/17/17 0055 97.7 F (36.5 C)     Temp Source 06/17/17 0055 Oral     SpO2 06/17/17 0055 98 %     Weight 06/17/17 0057 104 lb (47.2 kg)     Height 06/17/17 0057 5\' 6"  (1.676 m)     Head Circumference --      Peak Flow --      Pain Score 06/17/17 0054 3     Pain Loc --      Pain Edu? --      Excl. in [place]? --     Constitutional: Alert and oriented.  Frail appearing and in mild acute distress.  Upset. Eyes: Conjunctivae are normal. PERRL. EOMI. Head: Atraumatic. Nose: No congestion/rhinnorhea. Mouth/Throat: Mucous membranes are moist.  Oropharynx non-erythematous. Neck: No stridor.  No carotid bruits. Cardiovascular: Tachycardic rate, regular rhythm. Grossly normal heart sounds.  Good peripheral circulation. Respiratory: Normal respiratory effort.  No retractions. Lungs CTAB. Gastrointestinal: Soft and nontender. No distention. No abdominal bruits. No CVA tenderness. Musculoskeletal: No  lower extremity tenderness nor edema.  No joint effusions. Neurologic: Essential tremors left greater than right which is baseline for patient.  Normal speech and language.  Subjective right arm numbness.  Slightly weaker grip on the right.  Chronic and baseline right leg weakness secondary to prior surgeries.   Skin:  Skin is warm, dry and intact. No rash noted. Psychiatric: Mood and affect are normal. Speech and behavior are normal.  ____________________________________________   LABS (all labs ordered are listed, but only abnormal results are displayed)  Labs Reviewed  BASIC METABOLIC PANEL - Abnormal; Notable for the following components:      Result Value   Sodium 133 (*)    Chloride 93 (*)    Glucose, Bld 160 (*)    BUN 29 (*)    All other components within normal limits  CBC  TROPONIN I   ____________________________________________  EKG  ED ECG REPORT I, Seith Aikey J, the attending physician, personally viewed and interpreted this ECG.   Date: 06/17/2017  EKG Time: 0054  Rate: 117  Rhythm: sinus tachycardia  Axis: RAD   Intervals:none  ST&T Change: Nonspecific  ____________________________________________  RADIOLOGY  Dg Chest 2 View  Result Date: 06/17/2017 CLINICAL DATA:  Initial evaluation for acute chest pain, right arm numbness. EXAM: CHEST  2 VIEW COMPARISON:  Prior radiograph from 03/30/2016. FINDINGS: Patient is rotated to the left. Transverse heart size within normal limits. Mediastinal silhouette within normal limits. Aortic atherosclerosis. Lungs normally inflated. No focal infiltrates. No pulmonary edema or pleural effusion. No pneumothorax. Mild height loss at the superior endplate of T5, somewhat age indeterminate, but favored to be chronic. No other acute osseous abnormality. IMPRESSION: 1. No active cardiopulmonary disease. 2. Aortic atherosclerosis. 3. Mild height loss at the superior endplate of T5, somewhat age indeterminate, but favored to be  chronic. Correlation with physical exam recommended. Electronically Signed   By: Jeannine Boga M.D.   On: 06/17/2017 02:34   Ct Head Wo Contrast  Result Date: 06/17/2017 CLINICAL DATA:  Initial evaluation for acute right arm numbness. EXAM: CT HEAD WITHOUT CONTRAST TECHNIQUE: Contiguous axial images were obtained from the base of the skull through the vertex without intravenous contrast. COMPARISON:  Prior MRI from 11/27/2016. FINDINGS: Brain: Moderate cerebral atrophy with chronic small vessel ischemic disease. Scatter remote lacunar infarct present within the bilateral basal ganglia and thalami. Small remote left cerebellar infarct noted. No acute intracranial hemorrhage. No acute large vessel territory infarct. No mass lesion, midline shift or mass effect. No hydrocephalus. No extra-axial fluid collection. Vascular: No asymmetric hyperdense vessel. Prominent calcified atherosclerosis at the skullbase. Skull: Scalp soft tissues and calvarium within normal limits. Sinuses/Orbits: Globes oral soft tissues within normal limits. Patient status post lens extraction bilaterally. Paranasal sinuses are clear. Right mastoid effusion noted. Other: None. IMPRESSION: 1. No acute intracranial abnormality. 2. Moderate atrophy with chronic small vessel ischemic disease. 3. Right mastoid effusion. Electronically Signed   By: Jeannine Boga M.D.   On: 06/17/2017 01:29    ____________________________________________   PROCEDURES  Procedure(s) performed: None   Level of Consciousness (1a.)   : Alert, keenly responsive (11/19 0128) LOC Questions (1b. )   +: Answers both questions correctly (11/19 0128) LOC Commands (1c. )   + : Performs both tasks correctly (11/19 0128) Best Gaze (2. )  +: Normal (11/19 0128) Visual (3. )  +: No visual loss (11/19 0128) Facial Palsy (4. )    : Normal symmetrical movements (11/19 0128) Motor Arm, Left (5a. )   +: No drift (11/19 0128) Motor Arm, Right (5b. )   +: No  drift (11/19 0128) Motor Leg, Left (6a. )   +: No drift (11/19 0128) Motor Leg, Right (6b. )   +: No effort against gravity (11/19 0128) Limb Ataxia (7. ): Present in one limb (11/19 0128) Sensory (8. )   +: Mild-to-moderate sensory loss, patient feels pinprick is less sharp or is dull on the affected side, or there is a loss of superficial pain with pinprick, but patient is aware of being touched (11/19 0128) Best Language (9. )   +: No aphasia (11/19 0128) Dysarthria (10. ): Normal (11/19 0128) Extinction/Inattention (11.)   +: No Abnormality (11/19 0128) Total: 5 (11/19 0128)  Procedures  Critical Care performed: Yes, see critical care note(s)  CRITICAL CARE Performed by: Paulette Blanch   Total critical care time:  45 minutes  Critical care time was exclusive of separately billable procedures and treating other patients.  Critical care was necessary to treat or prevent imminent or life-threatening deterioration.  Critical care was time spent personally by me on the following activities: development of treatment plan with patient and/or surrogate as well as nursing, discussions with consultants, evaluation of patient's response to treatment, examination of patient, obtaining history from patient or surrogate, ordering and performing treatments and interventions, ordering and review of laboratory studies, ordering and review of radiographic studies, pulse oximetry and re-evaluation of patient's condition. ____________________________________________   INITIAL IMPRESSION / ASSESSMENT AND PLAN / ED COURSE  As part of my medical decision making, I reviewed the following data within the [place] History obtained from family, Nursing notes reviewed and incorporated, Labs reviewed, EKG interpreted, Radiograph reviewed, Discussed with admitting physician Dr. Marcille Blanco, A consult was requested and obtained from this/these consultant(s) Neurology and Notes from prior ED  visits.   83 year old female who presents with chest pain and right arm numbness with weaker grip strength on the right.  Differential diagnosis includes but is not limited to acute CVA, ICH, meningitis, encephalopathyACS, aortic dissection, pulmonary embolism, cardiac tamponade, pneumothorax, pneumonia, pericarditis, myocarditis, GI-related causes including esophagitis/gastritis, and musculoskeletal chest wall pain.    Code stroke was initiated in triage.  In addition to low NIH stroke scale, time of onset cannot be accurately verified.  Do not feel patient is candidate for TPA.  Tele-neurology to consult.  Clinical Course as of Jun 17 301  Mon Jun 17, 2017  0140 Spoke with tele-neurologist who agrees patient is not a candidate for TPA.  NIH stroke scale is 1.  Recommends admission for medical workup.  [JS]    Clinical Course User Index [JS] Paulette Blanch, MD     ____________________________________________   FINAL CLINICAL IMPRESSION(S) / ED DIAGNOSES  Final diagnoses:  Numbness  Chest pain, unspecified type  Cerebrovascular accident (CVA), unspecified mechanism [facility])     ED Discharge Orders    None       Note:  This document was prepared using Dragon voice recognition software and may include unintentional dictation errors.    Paulette Blanch, MD 06/17/17 [phone]

## 2017-06-17 NOTE — ED Triage Notes (Signed)
Pt reports having chest pain all day since she woke up this am, pt reports the chest pain is all the way across her chest and is throbbing, pt states that at 2330 pt started having rt arm numbness

## 2017-06-17 NOTE — Progress Notes (Signed)
Initial Nutrition Assessment  DOCUMENTATION CODES:   Severe malnutrition in context of chronic illness  INTERVENTION:   Recommend check phosphorus, B12, folate rbc, copper, vitamin B6, and vitamin D levels   Ensure Enlive po TID, each supplement provides 350 kcal and 20 grams of protein  MVI daily  Magic cup TID with meals, each supplement provides 290 kcal and 9 grams of protein  Dysphagia 3 diet as pt reports difficulty chewing  Recommend bowel regimen as needed  NUTRITION DIAGNOSIS:   Severe Malnutrition related to chronic illness(dementia, advanced age ) as evidenced by severe fat depletion, severe muscle depletion  GOAL:   Patient will meet greater than or equal to 90% of their needs  MONITOR:   PO intake, Supplement acceptance, Labs, Weight trends, I & O's  REASON FOR ASSESSMENT:   Consult Assessment of nutrition requirement/status  ASSESSMENT:   83 y.o. female with a hx of prior stroke in 1999 with residual right upper and lower extremity deficits, resting tremor, DM, HTN, HLD, frequent falls with prior hip fracture x 2 in 2017, and RLS who is being seen today for the evaluation of chest pain    Met with pt in room today. Pt reports good appetite and oral intake pta. Pt ate 95% of her lunch today. Pt does not drink supplements at home but is willing to try strawberry Ensure. Pt with severe muscle and fat depletions and reports blisters in her mouth. Pt also noted to have tremors, neuropathy, numbness, and muscle pain. Nutrients deficiencies that can lead to these symptoms include phosphorus, B12, folate, copper, vitamin B6, and vitamin D. Per chart, pt is weight stable since June. Pt reports difficulty chewing; RD will order dysphagia 3 diet. Pt reports no BM in a week; recommend bowel regimen as needed. RD will add Ensure, MVI, and Magic Cups.   Medications reviewed and include: aspirin, Vitamin D, colace, lovenox, insulin, tramadol   Labs reviewed: Na 133(L), Cl  93(L), BUN 29(H), Mg 1.7 wnl  Nutrition-Focused physical exam completed. Findings are severe fat and muscle depletions over entire body, and no edema.   Diet Order:  Diet NPO time specified Except for: Sips with Meds DIET DYS 3 Room service appropriate? Yes; Fluid consistency: Thin  EDUCATION NEEDS:   Education needs have been addressed  Skin: Reviewed RN Assessment  Last BM:  constipation   Height:   Ht Readings from Last 1 Encounters:  06/17/17 '5\' 6"'  (1.676 m)    Weight:   Wt Readings from Last 1 Encounters:  06/17/17 107 lb 1.6 oz (48.6 kg)    Ideal Body Weight:  59 kg  BMI:  Body mass index is 17.29 kg/m.  Estimated Nutritional Needs:   Kcal:  1400-1600kcal/day   Protein:  70-80g/day   Fluid:  >1.4L/day   Koleen Distance MS, RD, LDN Pager #[phone] After Hours Pager: [phone]

## 2017-06-17 NOTE — Plan of Care (Signed)
Patient was admitted about 0500. Patient is able to answer all profile questions. Patient has a NIH score of 7. Patient is very weak and needs assistance. No complaints of pain. Will continue to monitor and assess.

## 2017-06-17 NOTE — Consult Note (Signed)
Cardiology Consultation:   Patient ID: BONA HUBBARD; [id number]; [birth year]   Admit date: 06/17/2017 Date of Consult: 06/17/2017  Primary Care Provider: Cyndi Bender, PA-C Primary Cardiologist: New to [facility] - consult by Fletcher Anon   Patient Profile:   Carrie Bishop is a 83 y.o. female with a hx of prior stroke in 1999 with residual right upper and lower extremity deficits, resting tremor, DM, HTN, HLD, frequent falls with prior hip fracture x 2 in 2017, and RLS who is being seen today for the evaluation of chest pain at the request of Dr. Jannifer Franklin.  History of Present Illness:   Carrie Bishop has no previously known cardiac history. She was in her usual state of health until the morning of 11/18 when she developed right upper extremity numbness and chest pain at rest. Both the chest pain and right upper extremity numbness lasted "all day." The chest pain resolved during the night on it's own, however, she continues to note right upper extremity numbness. She has never had pain like this before. No exertional symptoms. She was never a smoker. She has known DM, HTN, and HLD.   Upon the patient's arrival to [facility] they were found to have BP 187/109, HR 114 bpm, temp 97.7, oxygen saturation 98% on room air, weight 104 pounds. EKG as below, CXR showed no acute process. CT head not acute Labs showed troponin negative x 2, TSH normal, unremarkable cbc, SCr 0.74, K+ 3.9, Na 133. She was tele-consulted on by neurology overnight who did not recommend tPA. Further neuro work up is pending at this time. She is currently eating breakfast. She denies any chest pain, SOB, palpitations, nausea, vomiting, diaphoresis, dizziness, presyncope, or syncope. She continues to note right upper extremity numbness.   Past Medical History:  Diagnosis Date  . Depression   . Diabetes mellitus without complication ([place])   . Hip fracture [facility])    left March 2017  . Hypertension   . Neuropathy   . Osteoporosis   .  Restless leg syndrome   . Stroke ([place])   . Tremor     Past Surgical History:  Procedure Laterality Date  . ABDOMINAL HYSTERECTOMY    . arm surgery    . INTRAMEDULLARY (IM) NAIL INTERTROCHANTRIC Right 03/31/2016   Performed by Dereck Leep, MD at [facility] ORS  . INTRAMEDULLARY (IM) NAIL INTERTROCHANTRIC LEFT LONG AFFIXUS Left 10/21/2015   Performed by Hessie Knows, MD at [facility] ORS  . LEG SURGERY       Home Meds: Prior to Admission medications   Medication Sig Start Date End Date Taking? Authorizing Provider  acetaminophen (TYLENOL) 325 MG tablet Take 650 mg every 4 (four) hours as needed by mouth for mild pain.   Yes [provider]  alendronate (FOSAMAX) 70 MG tablet Take 70 mg by mouth once a week. Take with a full glass of water on an empty stomach. On Fridays.   Yes [provider]  carbidopa-levodopa (SINEMET IR) 25-100 MG tablet Take 1 tablet 3 (three) times daily by mouth. 05/27/17  Yes [provider]  cholecalciferol (VITAMIN D) 1000 units tablet Take 1,000 Units by mouth daily.   Yes [provider]  donepezil (ARICEPT) 5 MG tablet Take 5 mg at bedtime by mouth. 06/03/17  Yes [provider]  DULoxetine (CYMBALTA) 60 MG capsule Take 60 mg at bedtime by mouth.    Yes [provider]  gabapentin (NEURONTIN) 600 MG tablet Take 1,200 mg 2 (two)  times daily by mouth.    Yes [provider]  lisinopril (PRINIVIL,ZESTRIL) 40 MG tablet Take 1 tablet by mouth at bedtime.  09/26/15  Yes [provider]  metFORMIN (GLUCOPHAGE-XR) 500 MG 24 hr tablet Take 500 mg 2 (two) times daily by mouth.  05/27/17  Yes [provider]  pravastatin (PRAVACHOL) 40 MG tablet Take 40 mg daily by mouth. 06/05/17  Yes [provider]  primidone (MYSOLINE) 50 MG tablet Take 50-100 mg 2 (two) times daily by mouth. 100 mg in the morning and 50 mg at bedtime   Yes [provider]  traMADol (ULTRAM) 50 MG tablet Take 1-2  tablets (50-100 mg total) by mouth every 4 (four) hours as needed for moderate pain. 04/02/16  Yes Duanne Guess, PA-C    Inpatient Medications: Scheduled Meds: . carbidopa-levodopa  1 tablet Oral TID  . cholecalciferol  1,000 Units Oral Daily  . docusate sodium  100 mg Oral BID  . donepezil  5 mg Oral QHS  . DULoxetine  60 mg Oral Daily  . enoxaparin (LOVENOX) injection  40 mg Subcutaneous Q24H  . gabapentin  600 mg Oral TID  . insulin aspart  0-5 Units Subcutaneous QHS  . insulin aspart  0-9 Units Subcutaneous TID WC  . lisinopril  40 mg Oral QHS  . nitroGLYCERIN  0.5 inch Topical Q6H  . pravastatin  40 mg Oral Daily  . primidone  150 mg Oral QHS   Continuous Infusions:  PRN Meds: acetaminophen **OR** acetaminophen, ondansetron **OR** ondansetron (ZOFRAN) IV, traMADol  Allergies:  No Known Allergies  Social History:   Social History   Socioeconomic History  . Marital status: Widowed    Spouse name: Not on file  . Number of children: Not on file  . Years of education: Not on file  . Highest education level: Not on file  Social Needs  . Financial resource strain: Not on file  . Food insecurity - worry: Not on file  . Food insecurity - inability: Not on file  . Transportation needs - medical: Not on file  . Transportation needs - non-medical: Not on file  Occupational History  . Not on file  Tobacco Use  . Smoking status: Never Smoker  . Smokeless tobacco: Never Used  Substance and Sexual Activity  . Alcohol use: No  . Drug use: No  . Sexual activity: Not on file  Other Topics Concern  . Not on file  Social History Narrative  . Not on file     Family History:  Family History  Problem Relation Age of Onset  . Hypertension Other     ROS:  Review of Systems  Constitutional: Positive for malaise/fatigue. Negative for chills, diaphoresis, fever and weight loss.  HENT: Negative for congestion.   Eyes: Negative for discharge and redness.  Respiratory:  Negative for cough, hemoptysis, sputum production, shortness of breath and wheezing.   Cardiovascular: Positive for chest pain. Negative for palpitations, orthopnea, claudication, leg swelling and PND.  Gastrointestinal: Negative for abdominal pain, blood in stool, heartburn, melena, nausea and vomiting.  Genitourinary: Negative for hematuria.  Musculoskeletal: Negative for falls and myalgias.  Skin: Negative for rash.  Neurological: Positive for tingling, sensory change, focal weakness and weakness. Negative for dizziness, tremors, speech change, seizures, loss of consciousness and headaches.  Endo/Heme/Allergies: Does not bruise/bleed easily.  Psychiatric/Behavioral: Negative for substance abuse. The patient is not nervous/anxious.   All other systems reviewed and are negative.     Physical  Exam/Data:   Vitals:   06/17/17 0200 06/17/17 0230 06/17/17 0401 06/17/17 0449  BP: (!) 161/103 (!) 192/102 (!) 187/102 (!) 187/81  Pulse: (!) 102  95 100  Resp: 19 (!) 26 20 18   Temp:    98.1 F (36.7 C)  TempSrc:    Oral  SpO2: 97%  97% 99%  Weight:      Height:        Intake/Output Summary (Last 24 hours) at 06/17/2017 0927 Last data filed at 06/17/2017 2683 Gross per 24 hour  Intake -  Output 400 ml  Net -400 ml   Filed Weights   06/17/17 0057  Weight: 104 lb (47.2 kg)   Body mass index is 16.79 kg/m.   Physical Exam: General: Frail appearing, in no acute distress. Head: Normocephalic, atraumatic, sclera non-icteric, no xanthomas, nares without discharge.  Neck: Negative for carotid bruits. JVD not elevated. Lungs: Clear bilaterally to auscultation without wheezes, rales, or rhonchi. Breathing is unlabored. Heart: RRR with S1 S2. No murmurs, rubs, or gallops appreciated. Abdomen: Soft, non-tender, non-distended with normoactive bowel sounds. No hepatomegaly. No rebound/guarding. No obvious abdominal masses. Msk:  Strength and tone appear normal for age. Extremities: No  clubbing or cyanosis. No edema. Distal pedal pulses are 2+ and equal bilaterally. Neuro: Alert and oriented X 3. No facial asymmetry. No focal deficit. Moves all extremities spontaneously. Tremor noted. RUE/RLE weakness noted.  Psych:  Responds to questions appropriately with a normal affect.   EKG:  The EKG was personally reviewed and demonstrates: sinus tachycardia, 117 bpm, right axis deviation, nonspecific st/t changes Telemetry:  Telemetry was personally reviewed and demonstrates: NSR with sinus tachycardia, 80s to 120s bpm, first degree AV block  Weights: Filed Weights   06/17/17 0057  Weight: 104 lb (47.2 kg)    Relevant CV Studies: TTE pending  Laboratory Data:  Chemistry Recent Labs  Lab 06/17/17 0111  NA 133*  K 3.9  CL 93*  CO2 27  GLUCOSE 160*  BUN 29*  CREATININE 0.74  CALCIUM 9.9  GFRNONAA >60  GFRAA >60  ANIONGAP 13    No results for input(s): PROT, ALBUMIN, AST, ALT, ALKPHOS, BILITOT in the last 168 hours. Hematology Recent Labs  Lab 06/17/17 0111  WBC 7.7  RBC 4.39  HGB 13.8  HCT 40.2  MCV 91.5  MCH 31.4  MCHC 34.3  RDW 13.8  PLT 234   Cardiac Enzymes Recent Labs  Lab 06/17/17 0111 06/17/17 0454  TROPONINI <0.03 <0.03   No results for input(s): TROPIPOC in the last 168 hours.  BNPNo results for input(s): BNP, PROBNP in the last 168 hours.  DDimer No results for input(s): DDIMER in the last 168 hours.  Radiology/Studies:  Dg Chest 2 View  Result Date: 06/17/2017 IMPRESSION: 1. No active cardiopulmonary disease. 2. Aortic atherosclerosis. 3. Mild height loss at the superior endplate of T5, somewhat age indeterminate, but favored to be chronic. Correlation with physical exam recommended. Electronically Signed   By: Jeannine Boga M.D.   On: 06/17/2017 02:34   Ct Head Wo Contrast  Result Date: 06/17/2017 IMPRESSION: 1. No acute intracranial abnormality. 2. Moderate atrophy with chronic small vessel ischemic disease. 3. Right  mastoid effusion. Electronically Signed   By: Jeannine Boga M.D.   On: 06/17/2017 01:29    Assessment and Plan:   1. Chest pain with moderate risk of cardiac etiology: -Troponin negative x 2, cycle third troponin to rule out -Chest pain free -She would like a stress test  prior to going home, she was eating breakfast upon me entering the room this morning at 9:15 AM -Will schedule for Lexiscan Myoview tomorrow morning -Echo pending -ASA  2. RUE paresthesias: -Prior stroke affecting the right side with residual deficits per patient -Baseline unknown  -Neurology on board -MRI brain pending -Consider carotid ultrasound  3. Accelerated HTN: -Permissive BP currently -Titrate medications as needed  4. HLD: -Check lipid panel -Statin  5. DM2: -Per IM   For questions or updates, please contact [place] Please consult [email or web address] for contact info under Cardiology/STEMI.   Signed, Christell Faith, PA-C [place] Pager: [phone] 06/17/2017, 9:27 AM

## 2017-06-17 NOTE — ED Notes (Signed)
Report to noel, rn.  

## 2017-06-17 NOTE — Consult Note (Signed)
Referring Physician: Sudini    Chief Complaint: Right arm and chest pain  HPI: Carrie Bishop is an 83 y.o. female with a history of dementia and stroke with resultant right hemiparesis who reports that on yesterday she awakened with chest pain and right arm pain and weakness.  Patient reports that she felt it was worse than her baseline deficits.  She called her daughter later in the evening and she prompted medical evaluation.  There are multiple accounts in the chart concerning LKW due to patient's dementia and difficulty providing history.  Initial NIHSS of 5.    Date last known well: Date: 06/15/2017 Time last known well: Unable to determine tPA Given: No: Unable to determine LKW  Past Medical History:  Diagnosis Date  . Depression   . Diabetes mellitus without complication ([place])   . Hip fracture [facility])    left March 2017  . Hypertension   . Neuropathy   . Osteoporosis   . Restless leg syndrome   . Stroke ([place])   . Tremor     Past Surgical History:  Procedure Laterality Date  . ABDOMINAL HYSTERECTOMY    . arm surgery    . INTRAMEDULLARY (IM) NAIL INTERTROCHANTRIC Right 03/31/2016   Performed by Dereck Leep, MD at [facility] ORS  . INTRAMEDULLARY (IM) NAIL INTERTROCHANTRIC LEFT LONG AFFIXUS Left 10/21/2015   Performed by Hessie Knows, MD at [facility] ORS  . LEG SURGERY      Family History  Problem Relation Age of Onset  . Hypertension Other    Social History:  reports that  has never smoked. she has never used smokeless tobacco. She reports that she does not drink alcohol or use drugs.  Allergies: No Known Allergies  Medications:  I have reviewed the patient's current medications. Prior to Admission:  Medications Prior to Admission  Medication Sig Dispense Refill Last Dose  . acetaminophen (TYLENOL) 325 MG tablet Take 650 mg every 4 (four) hours as needed by mouth for mild pain.   prn at prn  . alendronate (FOSAMAX) 70 MG tablet Take 70 mg by mouth once a week. Take with a  full glass of water on an empty stomach. On Fridays.   unknown at unknown  . carbidopa-levodopa (SINEMET IR) 25-100 MG tablet Take 1 tablet 3 (three) times daily by mouth.   unknown at unknown  . cholecalciferol (VITAMIN D) 1000 units tablet Take 1,000 Units by mouth daily.   unknown at unknown  . donepezil (ARICEPT) 5 MG tablet Take 5 mg at bedtime by mouth.   unknown at unknown  . DULoxetine (CYMBALTA) 60 MG capsule Take 60 mg at bedtime by mouth.    unknown at unknown  . gabapentin (NEURONTIN) 600 MG tablet Take 1,200 mg 2 (two) times daily by mouth.    unknown at unknown  . lisinopril (PRINIVIL,ZESTRIL) 40 MG tablet Take 1 tablet by mouth at bedtime.    unknown at unknown  . metFORMIN (GLUCOPHAGE-XR) 500 MG 24 hr tablet Take 500 mg 2 (two) times daily by mouth.    unknown at unknown  . pravastatin (PRAVACHOL) 40 MG tablet Take 40 mg daily by mouth.   unknown at unknown  . primidone (MYSOLINE) 50 MG tablet Take 50-100 mg 2 (two) times daily by mouth. 100 mg in the morning and 50 mg at bedtime   unknown at unknown  . traMADol (ULTRAM) 50 MG tablet Take 1-2 tablets (50-100 mg total) by mouth every 4 (four) hours as needed for moderate  pain. 30 tablet 0 prn at prn   Scheduled: . carbidopa-levodopa  1 tablet Oral TID  . cholecalciferol  1,000 Units Oral Daily  . docusate sodium  100 mg Oral BID  . donepezil  5 mg Oral QHS  . DULoxetine  60 mg Oral Daily  . enoxaparin (LOVENOX) injection  40 mg Subcutaneous Q24H  . gabapentin  600 mg Oral TID  . insulin aspart  0-5 Units Subcutaneous QHS  . insulin aspart  0-9 Units Subcutaneous TID WC  . lisinopril  40 mg Oral QHS  . nitroGLYCERIN  0.5 inch Topical Q6H  . pravastatin  40 mg Oral Daily  . primidone  150 mg Oral QHS    ROS: History obtained from the patient  General ROS: negative for - chills, fatigue, fever, night sweats, weight gain or weight loss Psychological ROS: memory difficulties Ophthalmic ROS: negative for - blurry vision,  double vision, eye pain or loss of vision ENT ROS: negative for - epistaxis, nasal discharge, oral lesions, sore throat, tinnitus or vertigo Allergy and Immunology ROS: negative for - hives or itchy/watery eyes Hematological and Lymphatic ROS: negative for - bleeding problems, bruising or swollen lymph nodes Endocrine ROS: negative for - galactorrhea, hair pattern changes, polydipsia/polyuria or temperature intolerance Respiratory ROS: negative for - cough, hemoptysis, shortness of breath or wheezing Cardiovascular ROS: chest pain Gastrointestinal ROS: negative for - abdominal pain, diarrhea, hematemesis, nausea/vomiting or stool incontinence Genito-Urinary ROS: negative for - dysuria, hematuria, incontinence or urinary frequency/urgency Musculoskeletal ROS: right arm pain Neurological ROS: as noted in HPI Dermatological ROS: negative for rash and skin lesion changes  Physical Examination: Blood pressure (!) 187/81, pulse 100, temperature 98.1 F (36.7 C), temperature source Oral, resp. rate 18, height 5\' 6"  (1.676 m), weight 47.2 kg (104 lb), SpO2 99 %.  HEENT-  Normocephalic, no lesions, without obvious abnormality.  Normal external eye and conjunctiva.  Normal TM's bilaterally.  Normal auditory canals and external ears. Normal external nose, mucus membranes and septum.  Normal pharynx. Cardiovascular- S1, S2 normal, pulses palpable throughout   Lungs- chest clear, no wheezing, rales, normal symmetric air entry Abdomen- soft, non-tender; bowel sounds normal; no masses,  no organomegaly Extremities- no edema Lymph-no adenopathy palpable Musculoskeletal-RUE bony deformity Skin-warm and dry, no hyperpigmentation, vitiligo, or suspicious lesions  Neurological Examination   Mental Status: Alert.  Speech fluent without some word finding difficulties at times.  Able to follow 3 step commands without difficulty. Cranial Nerves: II: Discs flat bilaterally; Visual fields grossly normal, pupils  equal, round, reactive to light and accommodation III,IV, VI: ptosis not present, extra-ocular motions intact bilaterally V,VII: smile symmetric, facial light touch sensation normal bilaterally VIII: hearing normal bilaterally IX,X: gag reflex present XI: bilateral shoulder shrug XII: midline tongue extension Motor: Right : Upper extremity   4/5    Left:     Upper extremity   5-/5  Lower extremity   2/5     Lower extremity   5-/5 Tone and bulk:normal tone throughout; no atrophy noted Sensory: Pinprick and light touch decreased in the right upper and lower extremities Deep Tendon Reflexes: 2+ in the upper extremities and the RLE, 1+ in the LLE.  Absent at the AJ's bilaterally Plantars: Right: upgoing   Left: upgoing Cerebellar: Normal finger-to-nose with extensive LUE tremor.  Head tremor noted as well.   Gait: not tested due to safety concerns    Laboratory Studies:  Basic Metabolic Panel: Recent Labs  Lab 06/17/17 0111  NA 133*  K 3.9  CL 93*  CO2 27  GLUCOSE 160*  BUN 29*  CREATININE 0.74  CALCIUM 9.9    Liver Function Tests: No results for input(s): AST, ALT, ALKPHOS, BILITOT, PROT, ALBUMIN in the last 168 hours. No results for input(s): LIPASE, AMYLASE in the last 168 hours. No results for input(s): AMMONIA in the last 168 hours.  CBC: Recent Labs  Lab 06/17/17 0111  WBC 7.7  HGB 13.8  HCT 40.2  MCV 91.5  PLT 234    Cardiac Enzymes: Recent Labs  Lab 06/17/17 0111 06/17/17 0454  TROPONINI <0.03 <0.03    BNP: Invalid input(s): POCBNP  CBG: Recent Labs  Lab 06/17/17 0745  GLUCAP 14*    Microbiology: Results for orders placed or performed during the hospital encounter of 03/30/16  Surgical PCR screen     Status: Abnormal   Collection Time: 03/30/16  6:12 PM  Result Value Ref Range Status   MRSA, PCR NEGATIVE NEGATIVE Final   Staphylococcus aureus POSITIVE (A) NEGATIVE Final    Comment:        The Xpert SA Assay (FDA approved for NASAL  specimens in patients over 60 years of age), is one component of a comprehensive surveillance program.  Test performance has been validated by [facility] for patients greater than or equal to 34 year old. It is not intended to diagnose infection nor to guide or monitor treatment.     Coagulation Studies: No results for input(s): LABPROT, INR in the last 72 hours.  Urinalysis: No results for input(s): COLORURINE, LABSPEC, PHURINE, GLUCOSEU, HGBUR, BILIRUBINUR, KETONESUR, PROTEINUR, UROBILINOGEN, NITRITE, LEUKOCYTESUR in the last 168 hours.  Invalid input(s): APPERANCEUR  Lipid Panel: No results found for: CHOL, TRIG, HDL, CHOLHDL, VLDL, LDLCALC  HgbA1C:  Lab Results  Component Value Date   HGBA1C 5.9 03/30/2016    Urine Drug Screen:  No results found for: LABOPIA, COCAINSCRNUR, LABBENZ, AMPHETMU, THCU, LABBARB  Alcohol Level: No results for input(s): ETH in the last 168 hours.  Other results: EKG: sinus tachycardia at 117 bpm.  Imaging: Dg Chest 2 View  Result Date: 06/17/2017 CLINICAL DATA:  Initial evaluation for acute chest pain, right arm numbness. EXAM: CHEST  2 VIEW COMPARISON:  Prior radiograph from 03/30/2016. FINDINGS: Patient is rotated to the left. Transverse heart size within normal limits. Mediastinal silhouette within normal limits. Aortic atherosclerosis. Lungs normally inflated. No focal infiltrates. No pulmonary edema or pleural effusion. No pneumothorax. Mild height loss at the superior endplate of T5, somewhat age indeterminate, but favored to be chronic. No other acute osseous abnormality. IMPRESSION: 1. No active cardiopulmonary disease. 2. Aortic atherosclerosis. 3. Mild height loss at the superior endplate of T5, somewhat age indeterminate, but favored to be chronic. Correlation with physical exam recommended. Electronically Signed   By: Jeannine Boga M.D.   On: 06/17/2017 02:34   Ct Head Wo Contrast  Result Date: 06/17/2017 CLINICAL DATA:   Initial evaluation for acute right arm numbness. EXAM: CT HEAD WITHOUT CONTRAST TECHNIQUE: Contiguous axial images were obtained from the base of the skull through the vertex without intravenous contrast. COMPARISON:  Prior MRI from 11/27/2016. FINDINGS: Brain: Moderate cerebral atrophy with chronic small vessel ischemic disease. Scatter remote lacunar infarct present within the bilateral basal ganglia and thalami. Small remote left cerebellar infarct noted. No acute intracranial hemorrhage. No acute large vessel territory infarct. No mass lesion, midline shift or mass effect. No hydrocephalus. No extra-axial fluid collection. Vascular: No asymmetric hyperdense vessel. Prominent calcified atherosclerosis at the  skullbase. Skull: Scalp soft tissues and calvarium within normal limits. Sinuses/Orbits: Globes oral soft tissues within normal limits. Patient status post lens extraction bilaterally. Paranasal sinuses are clear. Right mastoid effusion noted. Other: None. IMPRESSION: 1. No acute intracranial abnormality. 2. Moderate atrophy with chronic small vessel ischemic disease. 3. Right mastoid effusion. Electronically Signed   By: Jeannine Boga M.D.   On: 06/17/2017 01:29    Assessment: 83 y.o. female with a history of dementia and stroke with residual right hemiparesis who presents with complaints of chest pain and right upper extremity weakness and pain.  Symptoms improved today.  Unclear LKW.  Head CT reviewed and shows no acute changes.  Patient on no antiplatelet therapy at home. A1c pending.  Stroke Risk Factors - diabetes mellitus and hypertension  Plan: 1. Fasting lipid panel 2. MRI of the brain without contrast pending 3. PT consult, OT consult, Speech consult 4. Prophylactic therapy-Antiplatelet med: Aspirin - dose 325mg  daily 5. Telemetry monitoring 6. Frequent neuro checks 7. Carotid dopplers 8. Echocardiogram pending   Alexis Goodell, MD Neurology [phone] 06/17/2017,  10:17 AM

## 2017-06-17 NOTE — Progress Notes (Signed)
*  PRELIMINARY RESULTS* Echocardiogram 2D Echocardiogram has been performed.  Carrie Bishop 06/17/2017, 3:18 PM

## 2017-06-17 NOTE — ED Notes (Signed)
Patient transported to X-ray 

## 2017-06-17 NOTE — ED Notes (Signed)
Family back at bedside.

## 2017-06-18 ENCOUNTER — Encounter: Payer: Self-pay | Admitting: *Deleted

## 2017-06-18 ENCOUNTER — Other Ambulatory Visit: Payer: Self-pay

## 2017-06-18 DIAGNOSIS — R079 Chest pain, unspecified: Secondary | ICD-10-CM | POA: Diagnosis not present

## 2017-06-18 DIAGNOSIS — E119 Type 2 diabetes mellitus without complications: Secondary | ICD-10-CM | POA: Diagnosis not present

## 2017-06-18 DIAGNOSIS — I10 Essential (primary) hypertension: Secondary | ICD-10-CM | POA: Diagnosis not present

## 2017-06-18 DIAGNOSIS — R0789 Other chest pain: Secondary | ICD-10-CM | POA: Diagnosis not present

## 2017-06-18 DIAGNOSIS — G459 Transient cerebral ischemic attack, unspecified: Secondary | ICD-10-CM | POA: Diagnosis not present

## 2017-06-18 DIAGNOSIS — R200 Anesthesia of skin: Secondary | ICD-10-CM | POA: Diagnosis not present

## 2017-06-18 LAB — VITAMIN B12: Vitamin B-12: 281 pg/mL (ref 180–914)

## 2017-06-18 LAB — GLUCOSE, CAPILLARY
Glucose-Capillary: 191 mg/dL — ABNORMAL HIGH (ref 65–99)
Glucose-Capillary: 253 mg/dL — ABNORMAL HIGH (ref 65–99)

## 2017-06-18 LAB — FOLATE: Folate: 39 ng/mL (ref >5.9)

## 2017-06-18 LAB — PHOSPHORUS: PHOSPHORUS: 4 mg/dL (ref 2.5–4.6)

## 2017-06-18 MED ORDER — PANTOPRAZOLE SODIUM 40 MG PO TBEC: 40.0000 mg | DELAYED_RELEASE_TABLET | Freq: Every day | ORAL | 0 refills | Status: DC

## 2017-06-18 NOTE — Progress Notes (Signed)
Subjective: No new neurological complaints  Objective: Current vital signs: BP 126/72 (BP Location: Left Arm)   Pulse 73   Temp (!) 97.3 F (36.3 C) (Oral)   Resp 18   Ht 5\' 6"  (1.676 m)   Wt 47.4 kg (104 lb 8 oz)   SpO2 95%   BMI 16.87 kg/m  Vital signs in last 24 hours: Temp:  [97.3 F (36.3 C)-98.2 F (36.8 C)] 97.3 F (36.3 C) (11/20 0748) Pulse Rate:  [67-92] 73 (11/20 0748) Resp:  [16-18] 18 (11/20 0748) BP: (117-131)/(56-72) 126/72 (11/20 0748) SpO2:  [94 %-99 %] 95 % (11/20 0748) Weight:  [47.4 kg (104 lb 8 oz)-48.6 kg (107 lb 1.6 oz)] 47.4 kg (104 lb 8 oz) (11/20 0346)  Intake/Output from previous day: 11/19 0701 - 11/20 0700 In: 717 [P.O.:717] Out: 800 [Urine:800] Intake/Output this shift: Total I/O In: -  Out: 150 [Urine:150] Nutritional status: DIET DYS 3 Room service appropriate? Yes; Fluid consistency: Thin  Neurologic Exam: Mental Status: Alert.  Speech fluent without some word finding difficulties at times.  Able to follow 3 step commands without difficulty. Cranial Nerves: II: Discs flat bilaterally; Visual fields grossly normal, pupils equal, round, reactive to light and accommodation III,IV, VI: ptosis not present, extra-ocular motions intact bilaterally V,VII: smile symmetric, facial light touch sensation normal bilaterally VIII: hearing normal bilaterally IX,X: gag reflex present XI: bilateral shoulder shrug XII: midline tongue extension Motor: Right :  Upper extremity   4/5                                      Left:     Upper extremity   5-/5             Lower extremity   2/5                                                  Lower extremity   5-/5 Tone and bulk:normal tone throughout; no atrophy noted   Lab Results: Basic Metabolic Panel: Recent Labs  Lab 06/17/17 0111 06/17/17 1016 06/18/17 0411  NA 133*  --   --   K 3.9  --   --   CL 93*  --   --   CO2 27  --   --   GLUCOSE 160*  --   --   BUN 29*  --   --   CREATININE 0.74  --    --   CALCIUM 9.9  --   --   MG  --  1.7  --   PHOS  --   --  4.0    Liver Function Tests: No results for input(s): AST, ALT, ALKPHOS, BILITOT, PROT, ALBUMIN in the last 168 hours. No results for input(s): LIPASE, AMYLASE in the last 168 hours. No results for input(s): AMMONIA in the last 168 hours.  CBC: Recent Labs  Lab 06/17/17 0111  WBC 7.7  HGB 13.8  HCT 40.2  MCV 91.5  PLT 234    Cardiac Enzymes: Recent Labs  Lab 06/17/17 0111 06/17/17 0454 06/17/17 1016 06/17/17 1630  TROPONINI <0.03 <0.03 <0.03 <0.03    Lipid Panel: Recent Labs  Lab 06/17/17 0454  CHOL 162  TRIG 99  HDL 62  CHOLHDL 2.6  VLDL  20  LDLCALC 80    CBG: Recent Labs  Lab 06/17/17 0745 06/17/17 1619 06/17/17 2058 06/18/17 0819 06/18/17 1254  GLUCAP 223* 227* 215* 191* 253*    Microbiology: Results for orders placed or performed during the hospital encounter of 03/30/16  Surgical PCR screen     Status: Abnormal   Collection Time: 03/30/16  6:12 PM  Result Value Ref Range Status   MRSA, PCR NEGATIVE NEGATIVE Final   Staphylococcus aureus POSITIVE (A) NEGATIVE Final    Comment:        The Xpert SA Assay (FDA approved for NASAL specimens in patients over 69 years of age), is one component of a comprehensive surveillance program.  Test performance has been validated by [facility] for patients greater than or equal to 10 year old. It is not intended to diagnose infection nor to guide or monitor treatment.     Coagulation Studies: No results for input(s): LABPROT, INR in the last 72 hours.  Imaging: Dg Chest 2 View  Result Date: 06/17/2017 CLINICAL DATA:  Initial evaluation for acute chest pain, right arm numbness. EXAM: CHEST  2 VIEW COMPARISON:  Prior radiograph from 03/30/2016. FINDINGS: Patient is rotated to the left. Transverse heart size within normal limits. Mediastinal silhouette within normal limits. Aortic atherosclerosis. Lungs normally inflated. No focal  infiltrates. No pulmonary edema or pleural effusion. No pneumothorax. Mild height loss at the superior endplate of T5, somewhat age indeterminate, but favored to be chronic. No other acute osseous abnormality. IMPRESSION: 1. No active cardiopulmonary disease. 2. Aortic atherosclerosis. 3. Mild height loss at the superior endplate of T5, somewhat age indeterminate, but favored to be chronic. Correlation with physical exam recommended. Electronically Signed   By: Jeannine Boga M.D.   On: 06/17/2017 02:34   Ct Head Wo Contrast  Result Date: 06/17/2017 CLINICAL DATA:  Initial evaluation for acute right arm numbness. EXAM: CT HEAD WITHOUT CONTRAST TECHNIQUE: Contiguous axial images were obtained from the base of the skull through the vertex without intravenous contrast. COMPARISON:  Prior MRI from 11/27/2016. FINDINGS: Brain: Moderate cerebral atrophy with chronic small vessel ischemic disease. Scatter remote lacunar infarct present within the bilateral basal ganglia and thalami. Small remote left cerebellar infarct noted. No acute intracranial hemorrhage. No acute large vessel territory infarct. No mass lesion, midline shift or mass effect. No hydrocephalus. No extra-axial fluid collection. Vascular: No asymmetric hyperdense vessel. Prominent calcified atherosclerosis at the skullbase. Skull: Scalp soft tissues and calvarium within normal limits. Sinuses/Orbits: Globes oral soft tissues within normal limits. Patient status post lens extraction bilaterally. Paranasal sinuses are clear. Right mastoid effusion noted. Other: None. IMPRESSION: 1. No acute intracranial abnormality. 2. Moderate atrophy with chronic small vessel ischemic disease. 3. Right mastoid effusion. Electronically Signed   By: Jeannine Boga M.D.   On: 06/17/2017 01:29   Mr Brain Wo Contrast  Result Date: 06/17/2017 CLINICAL DATA:  Subacute neuro deficits.  Hypertension diabetes EXAM: MRI HEAD WITHOUT CONTRAST TECHNIQUE:  Multiplanar, multiecho pulse sequences of the brain and surrounding structures were obtained without intravenous contrast. COMPARISON:  CT head 06/17/2017 FINDINGS: Brain: Negative for acute infarct. Moderate chronic microvascular ischemic change in the white matter bilaterally. Chronic lacunar infarcts in the thalamus and pons bilaterally. Chronic infarct left cerebellum. Moderate atrophy. Negative for mass lesion. Chronic microhemorrhage left thalamus and left cerebellar tonsil. Vascular: Normal arterial flow voids. Skull and upper cervical spine: C1-2 arthropathy with prominent pannus formation unchanged from MRI 11/27/2016. No focal bone lesion. Sinuses/Orbits: Mild mucosal edema  paranasal sinuses. Bilateral lens replacement Other: None IMPRESSION: Atrophy and moderate chronic ischemic change.  No acute abnormality. Electronically Signed   By: Franchot Gallo M.D.   On: 06/17/2017 12:46   US Carotid Bilateral  Result Date: 06/17/2017 CLINICAL DATA:  83 year old female with a history of TIA. Cardiovascular risk factors include hypertension, known prior stroke/TIA, hyperlipidemia, diabetes EXAM: BILATERAL CAROTID DUPLEX ULTRASOUND TECHNIQUE: Pearline Cables scale imaging, color Doppler and duplex ultrasound were performed of bilateral carotid and vertebral arteries in the neck. COMPARISON:  No prior duplex FINDINGS: Criteria: Quantification of carotid stenosis is based on velocity parameters that correlate the residual internal carotid diameter with NASCET-based stenosis levels, using the diameter of the distal internal carotid lumen as the denominator for stenosis measurement. The following velocity measurements were obtained: RIGHT ICA:  Systolic 416 cm/sec, Diastolic 17 cm/sec CCA:  79 cm/sec SYSTOLIC ICA/CCA RATIO:  1.5 ECA:  163 cm/sec LEFT ICA:  Systolic 93 cm/sec, Diastolic 16 cm/sec CCA:  63 cm/sec SYSTOLIC ICA/CCA RATIO:  1.5 ECA:  137 cm/sec Right Brachial SBP: Not acquired Left Brachial SBP: Not acquired RIGHT  CAROTID ARTERY: No significant calcifications of the right common carotid artery. Intermediate waveform maintained. Moderate heterogeneous and partially calcified plaque at the right carotid bifurcation. No significant lumen shadowing. Low resistance waveform of the right ICA. Tortuosity RIGHT VERTEBRAL ARTERY: Antegrade flow with low resistance waveform. LEFT CAROTID ARTERY: No significant calcifications of the left common carotid artery. Intermediate waveform maintained. Moderate heterogeneous and partially calcified plaque at the left carotid bifurcation. No significant lumen shadowing. Low resistance waveform of the left ICA. Tortuosity LEFT VERTEBRAL ARTERY:  Antegrade flow with low resistance waveform. IMPRESSION: Color duplex indicates moderate heterogeneous and calcified plaque, with no hemodynamically significant stenosis by duplex criteria in the extracranial cerebrovascular circulation. Signed, Dulcy Fanny. Earleen Newport, DO [facility] Electronically Signed   By: Corrie Mckusick D.O.   On: 06/17/2017 11:48    Medications:  I have reviewed the patient's current medications. Scheduled: . aspirin EC  325 mg Oral Daily  . carbidopa-levodopa  1 tablet Oral TID  . cholecalciferol  1,000 Units Oral Daily  . docusate sodium  100 mg Oral BID  . donepezil  5 mg Oral QHS  . DULoxetine  60 mg Oral Daily  . enoxaparin (LOVENOX) injection  40 mg Subcutaneous Q24H  . feeding supplement (ENSURE ENLIVE)  237 mL Oral TID BM  . gabapentin  600 mg Oral TID  . insulin aspart  0-5 Units Subcutaneous QHS  . insulin aspart  0-9 Units Subcutaneous TID WC  . lisinopril  40 mg Oral QHS  . metoprolol tartrate  12.5 mg Oral BID  . multivitamin with minerals  1 tablet Oral Daily  . nitroGLYCERIN  0.5 inch Topical Q6H  . pravastatin  40 mg Oral Daily  . primidone  150 mg Oral QHS    Assessment/Plan: Patient without new complaints.  MRI of the brain reviewed and  shows no acute changes.  Carotid dopplers show no evidence of hemodynamically significant stenosis.  Echocardiogram shows no cardiac source of emboli with an EF of 60-65%.  A1c 7.3, LDL 80.  Recommendations: 1.  Continue ASA  No further neurologic intervention is recommended at this time.  If further questions arise, please call or page at that time.  Thank you for allowing neurology to participate in the care of this patient.  Alexis Goodell, MD Neurology [phone] 06/18/2017  2:00 PM   LOS: 0 days

## 2017-06-18 NOTE — Progress Notes (Signed)
Inpatient Diabetes Program Recommendations  AACE/ADA: New Consensus Statement on Inpatient Glycemic Control (2015)  Target Ranges:  Prepandial:   less than 140 mg/dL      Peak postprandial:   less than 180 mg/dL (1-2 hours)      Critically ill patients:  140 - 180 mg/dL   Lab Results  Component Value Date   GLUCAP 253 (H) 06/18/2017   HGBA1C 7.3 (H) 06/17/2017    Review of Glycemic ControlResults for AIRANNA, PARTIN (MRN [id number]) as of 06/18/2017 13:36  Ref. Range 06/17/2017 07:45 06/17/2017 16:19 06/17/2017 20:58 06/18/2017 08:19 06/18/2017 12:54  Glucose-Capillary Latest Ref Range: 65 - 99 mg/dL 223 (H) 227 (H) 215 (H) 191 (H) 253 (H)   Diabetes history: Type 2 DM Outpatient Diabetes medications: Metformin 500 mg bid Current orders for Inpatient glycemic control:  Novolog sensitive tid with meals and HS  Inpatient Diabetes Program Recommendations:   If appropriate, while patient is in the hospital, consider adding Lantus 5 units daily.   Thanks, Adah Perl, RN, BC-ADM Inpatient Diabetes Coordinator Pager [phone] (8a-5p)

## 2017-06-18 NOTE — Progress Notes (Signed)
Pt. Discharged home today with daughters, VSS, and no complaints at this time. Nurse tech to escort patient via wheelchair. Family and patient educated on new medications , and follow-up appointments, with understanding.

## 2017-06-19 LAB — VITAMIN D 25 HYDROXY (VIT D DEFICIENCY, FRACTURES): VIT D 25 HYDROXY: 19.6 ng/mL — AB (ref 30.0–100.0)

## 2017-06-19 NOTE — Discharge Summary (Signed)
[place] [facility] [place] NAME: Carrie Bishop    MR#:  [id number]  DATE OF BIRTH:  [birth year]  DATE OF ADMISSION:  06/17/2017 ADMITTING PHYSICIAN: Harrie Foreman, MD  DATE OF DISCHARGE: 06/18/2017  3:37 PM  PRIMARY CARE PHYSICIAN: Cyndi Bender, PA-C   ADMISSION DIAGNOSIS:  Numbness [R20.0] Cerebrovascular accident (CVA), unspecified mechanism ([place]) [I63.9] Chest pain, unspecified type [R07.9]  DISCHARGE DIAGNOSIS:  Active Problems:   Chest pain   SECONDARY DIAGNOSIS:   Past Medical History:  Diagnosis Date  . Depression   . Diabetes mellitus without complication ([place])   . Hip fracture [facility])    left March 2017  . Hypertension   . Neuropathy   . Osteoporosis   . Restless leg syndrome   . Stroke ([place])   . Tremor      ADMITTING HISTORY  Chief Complaint: Numbness HPI: The patient with past medical history of hypertension, diabetes and history of stroke presents to the emergency department complaining of numbness.  The patient not completely clear on the onset of her perceived numbness in her right arm.  Upon arrival to the emergency department code stroke was initiated and the patient underwent a CT scan of the head which did not demonstrate acute problems.  Telemetry neurology evaluated the patient and could not confirm if her symptoms were indicative of stroke but did recommend workup of potential cardiac etiology to the arm pain.  Indeed the patient endorses some chest pain although onset is again hard to pinpoint.  She denies radiation of the pain but admits that it began around the same time as her arm numbness.  She also admits to nausea but denies vomiting or diaphoresis.  She was found to be stable in the emergency department called the hospitalist service for further evaluation.    HOSPITAL COURSE:   *Atypical chest pain.  Patient admitted to telemetry floor.  3 troponins were checked which were normal.  No arrhythmias on  telemetry.  Echocardiogram checked with no wall motion abnormalities.  Patient's chest pain had resolved.  Symptoms were more suggestive of GERD and started on Protonix orally daily.  No further chest pain.  *Chronic right-sided weakness from prior CVA.  Patient had MRI of the brain checked which showed nothing acute or new.  Seen by neurology and suggested continuing aspirin.  Patient is doing well by the day of discharge with no chest pain or shortness of breath.  Discussed care with patient and family.  Discharged home in stable condition to follow-up with primary care physician.  CONSULTS OBTAINED:  Treatment Team:  Alexis Goodell, MD  DRUG ALLERGIES:  No Known Allergies  DISCHARGE MEDICATIONS:   Discharge Medication List as of 06/18/2017  1:34 PM    START taking these medications   Details  pantoprazole (PROTONIX) 40 MG tablet Take 1 tablet (40 mg total) by mouth daily., Starting Tue 06/18/2017, Normal      CONTINUE these medications which have NOT CHANGED   Details  acetaminophen (TYLENOL) 325 MG tablet Take 650 mg every 4 (four) hours as needed by mouth for mild pain., Historical Med    alendronate (FOSAMAX) 70 MG tablet Take 70 mg by mouth once a week. Take with a full glass of water on an empty stomach. On Fridays., Historical Med    carbidopa-levodopa (SINEMET IR) 25-100 MG tablet Take 1 tablet 3 (three) times daily by mouth., Starting Mon 05/27/2017, Historical Med    cholecalciferol (VITAMIN D) 1000 units  tablet Take 1,000 Units by mouth daily., Historical Med    donepezil (ARICEPT) 5 MG tablet Take 5 mg at bedtime by mouth., Starting Mon 06/03/2017, Historical Med    DULoxetine (CYMBALTA) 60 MG capsule Take 60 mg at bedtime by mouth. , Historical Med    gabapentin (NEURONTIN) 600 MG tablet Take 1,200 mg 2 (two) times daily by mouth. , Historical Med    lisinopril (PRINIVIL,ZESTRIL) 40 MG tablet Take 1 tablet by mouth at bedtime. , Starting Mon 09/26/2015, Historical  Med    metFORMIN (GLUCOPHAGE-XR) 500 MG 24 hr tablet Take 500 mg 2 (two) times daily by mouth. , Starting Mon 05/27/2017, Historical Med    pravastatin (PRAVACHOL) 40 MG tablet Take 40 mg daily by mouth., Starting Wed 06/05/2017, Historical Med    primidone (MYSOLINE) 50 MG tablet Take 50-100 mg 2 (two) times daily by mouth. 100 mg in the morning and 50 mg at bedtime, Historical Med    traMADol (ULTRAM) 50 MG tablet Take 1-2 tablets (50-100 mg total) by mouth every 4 (four) hours as needed for moderate pain., Starting Mon 04/02/2016, Print        Today   VITAL SIGNS:  Blood pressure 126/72, pulse 73, temperature (!) 97.3 F (36.3 C), temperature source Oral, resp. rate 18, height 5\' 6"  (1.676 m), weight 47.4 kg (104 lb 8 oz), SpO2 95 %.  I/O:  No intake or output data in the 24 hours ending 06/19/17 1309  PHYSICAL EXAMINATION:  Physical Exam  GENERAL:  83 y.o.-year-old patient lying in the bed with no acute distress.  LUNGS: Normal breath sounds bilaterally, no wheezing, rales,rhonchi or crepitation. No use of accessory muscles of respiration.  CARDIOVASCULAR: S1, S2 normal. No murmurs, rubs, or gallops.  ABDOMEN: Soft, non-tender, non-distended. Bowel sounds present. No organomegaly or mass.  NEUROLOGIC: Moves all 4 extremities. PSYCHIATRIC: The patient is alert and oriented x 3.  SKIN: No obvious rash, lesion, or ulcer.   DATA REVIEW:   CBC Recent Labs  Lab 06/17/17 0111  WBC 7.7  HGB 13.8  HCT 40.2  PLT 234    Chemistries  Recent Labs  Lab 06/17/17 0111 06/17/17 1016  NA 133*  --   K 3.9  --   CL 93*  --   CO2 27  --   GLUCOSE 160*  --   BUN 29*  --   CREATININE 0.74  --   CALCIUM 9.9  --   MG  --  1.7    Cardiac Enzymes Recent Labs  Lab 06/17/17 1630  TROPONINI <0.03    Microbiology Results  Results for orders placed or performed during the hospital encounter of 03/30/16  Surgical PCR screen     Status: Abnormal   Collection Time: 03/30/16  6:12  PM  Result Value Ref Range Status   MRSA, PCR NEGATIVE NEGATIVE Final   Staphylococcus aureus POSITIVE (A) NEGATIVE Final    Comment:        The Xpert SA Assay (FDA approved for NASAL specimens in patients over 81 years of age), is one component of a comprehensive surveillance program.  Test performance has been validated by [facility] for patients greater than or equal to 10 year old. It is not intended to diagnose infection nor to guide or monitor treatment.     RADIOLOGY:  No results found.  Follow up with PCP in 1 week.  Management plans discussed with the patient, family and they are in agreement.  CODE STATUS:  Code  Status History    Date Active Date Inactive Code Status Order ID Comments User Context   06/17/2017 04:42 06/18/2017 19:43 Full Code [id number]  Harrie Foreman, MD Inpatient   03/30/2016 16:54 04/03/2016 19:23 Full Code [id number]  Lytle Butte, MD ED   10/20/2015 20:20 10/24/2015 19:09 Full Code [id number]  Theodoro Grist, MD Inpatient    Advance Directive Documentation     Most Recent Value  Type of Advance Directive  Healthcare Power of Attorney  Pre-existing out of facility DNR order (yellow form or pink MOST form)  No data  "MOST" Form in Place?  No data      TOTAL TIME TAKING CARE OF THIS PATIENT ON DAY OF DISCHARGE: more than 30 minutes.   Leia Alf Boleslaus Holloway M.D on 06/19/2017 at 1:09 PM  Between 7am to 6pm - Pager - [phone]  After 6pm go to [email or web address] - password [email or web address] [place] [facility]  Office  [phone]  CC: Primary care physician; Cyndi Bender, PA-C  Note: This dictation was prepared with Dragon dictation along with smaller phrase technology. Any transcriptional errors that result from this process are unintentional.

## 2017-06-20 LAB — COPPER, SERUM: Copper: 94 ug/dL (ref 72–166)

## 2017-06-21 DIAGNOSIS — M79604 Pain in right leg: Secondary | ICD-10-CM | POA: Diagnosis not present

## 2017-06-21 DIAGNOSIS — I739 Peripheral vascular disease, unspecified: Secondary | ICD-10-CM | POA: Diagnosis not present

## 2017-06-27 DIAGNOSIS — M79604 Pain in right leg: Secondary | ICD-10-CM | POA: Diagnosis not present

## 2017-06-27 DIAGNOSIS — G629 Polyneuropathy, unspecified: Secondary | ICD-10-CM | POA: Diagnosis not present

## 2017-06-27 DIAGNOSIS — Z681 Body mass index (BMI) 19 or less, adult: Secondary | ICD-10-CM | POA: Diagnosis not present

## 2017-06-28 ENCOUNTER — Encounter (INDEPENDENT_AMBULATORY_CARE_PROVIDER_SITE_OTHER): Payer: Medicare HMO | Admitting: Ophthalmology

## 2017-06-28 DIAGNOSIS — H35033 Hypertensive retinopathy, bilateral: Secondary | ICD-10-CM

## 2017-06-28 DIAGNOSIS — H353132 Nonexudative age-related macular degeneration, bilateral, intermediate dry stage: Secondary | ICD-10-CM

## 2017-06-28 DIAGNOSIS — H43813 Vitreous degeneration, bilateral: Secondary | ICD-10-CM

## 2017-06-28 DIAGNOSIS — I10 Essential (primary) hypertension: Secondary | ICD-10-CM | POA: Diagnosis not present

## 2017-06-28 DIAGNOSIS — H34812 Central retinal vein occlusion, left eye, with macular edema: Secondary | ICD-10-CM | POA: Diagnosis not present

## 2017-08-02 ENCOUNTER — Encounter (INDEPENDENT_AMBULATORY_CARE_PROVIDER_SITE_OTHER): Payer: Medicare HMO | Admitting: Ophthalmology

## 2017-08-02 DIAGNOSIS — H34812 Central retinal vein occlusion, left eye, with macular edema: Secondary | ICD-10-CM

## 2017-08-02 DIAGNOSIS — H353132 Nonexudative age-related macular degeneration, bilateral, intermediate dry stage: Secondary | ICD-10-CM

## 2017-08-02 DIAGNOSIS — H35033 Hypertensive retinopathy, bilateral: Secondary | ICD-10-CM

## 2017-08-02 DIAGNOSIS — I10 Essential (primary) hypertension: Secondary | ICD-10-CM | POA: Diagnosis not present

## 2017-08-02 DIAGNOSIS — H43813 Vitreous degeneration, bilateral: Secondary | ICD-10-CM

## 2017-08-08 DIAGNOSIS — R413 Other amnesia: Secondary | ICD-10-CM | POA: Diagnosis not present

## 2017-08-08 DIAGNOSIS — E119 Type 2 diabetes mellitus without complications: Secondary | ICD-10-CM | POA: Diagnosis not present

## 2017-08-08 DIAGNOSIS — G629 Polyneuropathy, unspecified: Secondary | ICD-10-CM | POA: Diagnosis not present

## 2017-08-08 DIAGNOSIS — F329 Major depressive disorder, single episode, unspecified: Secondary | ICD-10-CM | POA: Diagnosis not present

## 2017-08-08 DIAGNOSIS — I69359 Hemiplegia and hemiparesis following cerebral infarction affecting unspecified side: Secondary | ICD-10-CM | POA: Diagnosis not present

## 2017-08-08 DIAGNOSIS — G250 Essential tremor: Secondary | ICD-10-CM | POA: Diagnosis not present

## 2017-08-08 DIAGNOSIS — Z6820 Body mass index (BMI) 20.0-20.9, adult: Secondary | ICD-10-CM | POA: Diagnosis not present

## 2017-08-08 DIAGNOSIS — M79604 Pain in right leg: Secondary | ICD-10-CM | POA: Diagnosis not present

## 2017-08-08 DIAGNOSIS — I10 Essential (primary) hypertension: Secondary | ICD-10-CM | POA: Diagnosis not present

## 2017-09-05 DIAGNOSIS — R32 Unspecified urinary incontinence: Secondary | ICD-10-CM | POA: Diagnosis not present

## 2017-09-05 DIAGNOSIS — S82309A Unspecified fracture of lower end of unspecified tibia, initial encounter for closed fracture: Secondary | ICD-10-CM | POA: Diagnosis not present

## 2017-09-06 ENCOUNTER — Encounter (INDEPENDENT_AMBULATORY_CARE_PROVIDER_SITE_OTHER): Payer: Self-pay | Admitting: Ophthalmology

## 2017-09-12 ENCOUNTER — Encounter (INDEPENDENT_AMBULATORY_CARE_PROVIDER_SITE_OTHER): Payer: Medicare HMO | Admitting: Ophthalmology

## 2017-09-12 DIAGNOSIS — I10 Essential (primary) hypertension: Secondary | ICD-10-CM | POA: Diagnosis not present

## 2017-09-12 DIAGNOSIS — H353132 Nonexudative age-related macular degeneration, bilateral, intermediate dry stage: Secondary | ICD-10-CM | POA: Diagnosis not present

## 2017-09-12 DIAGNOSIS — H35033 Hypertensive retinopathy, bilateral: Secondary | ICD-10-CM | POA: Diagnosis not present

## 2017-09-12 DIAGNOSIS — H34812 Central retinal vein occlusion, left eye, with macular edema: Secondary | ICD-10-CM

## 2017-09-12 DIAGNOSIS — H43813 Vitreous degeneration, bilateral: Secondary | ICD-10-CM | POA: Diagnosis not present

## 2017-10-07 DIAGNOSIS — R32 Unspecified urinary incontinence: Secondary | ICD-10-CM | POA: Diagnosis not present

## 2017-10-07 DIAGNOSIS — S82309A Unspecified fracture of lower end of unspecified tibia, initial encounter for closed fracture: Secondary | ICD-10-CM | POA: Diagnosis not present

## 2017-11-07 ENCOUNTER — Encounter (INDEPENDENT_AMBULATORY_CARE_PROVIDER_SITE_OTHER): Payer: Self-pay | Admitting: Ophthalmology

## 2017-11-08 ENCOUNTER — Encounter (INDEPENDENT_AMBULATORY_CARE_PROVIDER_SITE_OTHER): Payer: Medicare HMO | Admitting: Ophthalmology

## 2017-11-08 DIAGNOSIS — H43813 Vitreous degeneration, bilateral: Secondary | ICD-10-CM

## 2017-11-08 DIAGNOSIS — H35033 Hypertensive retinopathy, bilateral: Secondary | ICD-10-CM

## 2017-11-08 DIAGNOSIS — I10 Essential (primary) hypertension: Secondary | ICD-10-CM

## 2017-11-08 DIAGNOSIS — E11311 Type 2 diabetes mellitus with unspecified diabetic retinopathy with macular edema: Secondary | ICD-10-CM

## 2017-11-08 DIAGNOSIS — H353132 Nonexudative age-related macular degeneration, bilateral, intermediate dry stage: Secondary | ICD-10-CM | POA: Diagnosis not present

## 2017-11-08 DIAGNOSIS — H34812 Central retinal vein occlusion, left eye, with macular edema: Secondary | ICD-10-CM | POA: Diagnosis not present

## 2017-11-08 DIAGNOSIS — E113312 Type 2 diabetes mellitus with moderate nonproliferative diabetic retinopathy with macular edema, left eye: Secondary | ICD-10-CM

## 2017-11-08 DIAGNOSIS — E113291 Type 2 diabetes mellitus with mild nonproliferative diabetic retinopathy without macular edema, right eye: Secondary | ICD-10-CM | POA: Diagnosis not present

## 2017-11-12 DIAGNOSIS — Z681 Body mass index (BMI) 19 or less, adult: Secondary | ICD-10-CM | POA: Diagnosis not present

## 2017-11-12 DIAGNOSIS — E119 Type 2 diabetes mellitus without complications: Secondary | ICD-10-CM | POA: Diagnosis not present

## 2017-11-12 DIAGNOSIS — M79604 Pain in right leg: Secondary | ICD-10-CM | POA: Diagnosis not present

## 2017-11-12 DIAGNOSIS — F329 Major depressive disorder, single episode, unspecified: Secondary | ICD-10-CM | POA: Diagnosis not present

## 2017-11-12 DIAGNOSIS — G250 Essential tremor: Secondary | ICD-10-CM | POA: Diagnosis not present

## 2017-11-12 DIAGNOSIS — S82309A Unspecified fracture of lower end of unspecified tibia, initial encounter for closed fracture: Secondary | ICD-10-CM | POA: Diagnosis not present

## 2017-11-12 DIAGNOSIS — I10 Essential (primary) hypertension: Secondary | ICD-10-CM | POA: Diagnosis not present

## 2017-11-12 DIAGNOSIS — R32 Unspecified urinary incontinence: Secondary | ICD-10-CM | POA: Diagnosis not present

## 2017-11-25 DIAGNOSIS — F0390 Unspecified dementia without behavioral disturbance: Secondary | ICD-10-CM | POA: Diagnosis not present

## 2017-11-25 DIAGNOSIS — G250 Essential tremor: Secondary | ICD-10-CM | POA: Diagnosis not present

## 2017-11-25 DIAGNOSIS — R269 Unspecified abnormalities of gait and mobility: Secondary | ICD-10-CM | POA: Diagnosis not present

## 2017-11-25 DIAGNOSIS — F329 Major depressive disorder, single episode, unspecified: Secondary | ICD-10-CM | POA: Diagnosis not present

## 2017-11-25 DIAGNOSIS — M792 Neuralgia and neuritis, unspecified: Secondary | ICD-10-CM | POA: Diagnosis not present

## 2017-12-26 DIAGNOSIS — R32 Unspecified urinary incontinence: Secondary | ICD-10-CM | POA: Diagnosis not present

## 2017-12-26 DIAGNOSIS — S82309A Unspecified fracture of lower end of unspecified tibia, initial encounter for closed fracture: Secondary | ICD-10-CM | POA: Diagnosis not present

## 2018-02-12 DIAGNOSIS — E1140 Type 2 diabetes mellitus with diabetic neuropathy, unspecified: Secondary | ICD-10-CM | POA: Diagnosis not present

## 2018-02-12 DIAGNOSIS — M79604 Pain in right leg: Secondary | ICD-10-CM | POA: Diagnosis not present

## 2018-02-12 DIAGNOSIS — G250 Essential tremor: Secondary | ICD-10-CM | POA: Diagnosis not present

## 2018-02-12 DIAGNOSIS — E7800 Pure hypercholesterolemia, unspecified: Secondary | ICD-10-CM | POA: Diagnosis not present

## 2018-02-12 DIAGNOSIS — I10 Essential (primary) hypertension: Secondary | ICD-10-CM | POA: Diagnosis not present

## 2018-02-12 DIAGNOSIS — F329 Major depressive disorder, single episode, unspecified: Secondary | ICD-10-CM | POA: Diagnosis not present

## 2018-02-12 DIAGNOSIS — I69359 Hemiplegia and hemiparesis following cerebral infarction affecting unspecified side: Secondary | ICD-10-CM | POA: Diagnosis not present

## 2018-03-07 ENCOUNTER — Encounter (INDEPENDENT_AMBULATORY_CARE_PROVIDER_SITE_OTHER): Payer: Self-pay | Admitting: Ophthalmology

## 2018-03-12 ENCOUNTER — Inpatient Hospital Stay
Admission: EM | Admit: 2018-03-12 | Discharge: 2018-03-16 | DRG: 378 | Disposition: A | Discharge status: Home / Self Care | Payer: Medicare HMO | Attending: Internal Medicine | Admitting: Internal Medicine

## 2018-03-12 ENCOUNTER — Other Ambulatory Visit: Payer: Self-pay

## 2018-03-12 ENCOUNTER — Emergency Department: Payer: Medicare HMO

## 2018-03-12 DIAGNOSIS — Z794 Long term (current) use of insulin: Secondary | ICD-10-CM | POA: Diagnosis not present

## 2018-03-12 DIAGNOSIS — K922 Gastrointestinal hemorrhage, unspecified: Secondary | ICD-10-CM | POA: Diagnosis not present

## 2018-03-12 DIAGNOSIS — E1140 Type 2 diabetes mellitus with diabetic neuropathy, unspecified: Secondary | ICD-10-CM | POA: Diagnosis present

## 2018-03-12 DIAGNOSIS — K5521 Angiodysplasia of colon with hemorrhage: Secondary | ICD-10-CM | POA: Diagnosis not present

## 2018-03-12 DIAGNOSIS — R195 Other fecal abnormalities: Secondary | ICD-10-CM | POA: Diagnosis not present

## 2018-03-12 DIAGNOSIS — N189 Chronic kidney disease, unspecified: Secondary | ICD-10-CM | POA: Diagnosis not present

## 2018-03-12 DIAGNOSIS — Z79899 Other long term (current) drug therapy: Secondary | ICD-10-CM | POA: Diagnosis not present

## 2018-03-12 DIAGNOSIS — K648 Other hemorrhoids: Secondary | ICD-10-CM | POA: Diagnosis not present

## 2018-03-12 DIAGNOSIS — M810 Age-related osteoporosis without current pathological fracture: Secondary | ICD-10-CM | POA: Diagnosis present

## 2018-03-12 DIAGNOSIS — K449 Diaphragmatic hernia without obstruction or gangrene: Secondary | ICD-10-CM | POA: Diagnosis not present

## 2018-03-12 DIAGNOSIS — E1122 Type 2 diabetes mellitus with diabetic chronic kidney disease: Secondary | ICD-10-CM | POA: Diagnosis not present

## 2018-03-12 DIAGNOSIS — K644 Residual hemorrhoidal skin tags: Secondary | ICD-10-CM | POA: Diagnosis present

## 2018-03-12 DIAGNOSIS — Z8673 Personal history of transient ischemic attack (TIA), and cerebral infarction without residual deficits: Secondary | ICD-10-CM

## 2018-03-12 DIAGNOSIS — F329 Major depressive disorder, single episode, unspecified: Secondary | ICD-10-CM | POA: Diagnosis present

## 2018-03-12 DIAGNOSIS — D649 Anemia, unspecified: Secondary | ICD-10-CM | POA: Diagnosis not present

## 2018-03-12 DIAGNOSIS — D500 Iron deficiency anemia secondary to blood loss (chronic): Secondary | ICD-10-CM | POA: Diagnosis not present

## 2018-03-12 DIAGNOSIS — E785 Hyperlipidemia, unspecified: Secondary | ICD-10-CM | POA: Diagnosis not present

## 2018-03-12 DIAGNOSIS — Z7983 Long term (current) use of bisphosphonates: Secondary | ICD-10-CM

## 2018-03-12 DIAGNOSIS — D62 Acute posthemorrhagic anemia: Secondary | ICD-10-CM | POA: Diagnosis present

## 2018-03-12 DIAGNOSIS — G20 Parkinson's disease: Secondary | ICD-10-CM | POA: Diagnosis not present

## 2018-03-12 DIAGNOSIS — G2581 Restless legs syndrome: Secondary | ICD-10-CM | POA: Diagnosis present

## 2018-03-12 DIAGNOSIS — I129 Hypertensive chronic kidney disease with stage 1 through stage 4 chronic kidney disease, or unspecified chronic kidney disease: Secondary | ICD-10-CM | POA: Diagnosis not present

## 2018-03-12 DIAGNOSIS — I10 Essential (primary) hypertension: Secondary | ICD-10-CM | POA: Diagnosis present

## 2018-03-12 DIAGNOSIS — R531 Weakness: Secondary | ICD-10-CM | POA: Diagnosis not present

## 2018-03-12 DIAGNOSIS — S0990XA Unspecified injury of head, initial encounter: Secondary | ICD-10-CM | POA: Diagnosis not present

## 2018-03-12 DIAGNOSIS — K2950 Unspecified chronic gastritis without bleeding: Secondary | ICD-10-CM | POA: Diagnosis not present

## 2018-03-12 DIAGNOSIS — F0280 Dementia in other diseases classified elsewhere without behavioral disturbance: Secondary | ICD-10-CM | POA: Diagnosis present

## 2018-03-12 DIAGNOSIS — E119 Type 2 diabetes mellitus without complications: Secondary | ICD-10-CM | POA: Diagnosis not present

## 2018-03-12 DIAGNOSIS — D509 Iron deficiency anemia, unspecified: Secondary | ICD-10-CM | POA: Diagnosis not present

## 2018-03-12 HISTORY — DX: Gastrointestinal hemorrhage, unspecified: K92.2

## 2018-03-12 HISTORY — DX: Anemia, unspecified: D64.9

## 2018-03-12 LAB — DIFFERENTIAL
BASOS ABS: 0.1 10*3/uL (ref 0–0.1)
Basophils Relative: 2 %
EOS ABS: 0.1 10*3/uL (ref 0–0.7)
Eosinophils Relative: 1 %
Lymphocytes Relative: 27 %
Lymphs Abs: 1.8 10*3/uL (ref 1.0–3.6)
MONOS PCT: 8 %
Monocytes Absolute: 0.5 10*3/uL (ref 0.2–0.9)
NEUTROS ABS: 4 10*3/uL (ref 1.4–6.5)
Neutrophils Relative %: 62 %

## 2018-03-12 LAB — COMPREHENSIVE METABOLIC PANEL
ALBUMIN: 4 g/dL (ref 3.5–5.0)
ALT: 9 U/L (ref 0–44)
AST: 28 U/L (ref 15–41)
Alkaline Phosphatase: 74 U/L (ref 38–126)
Anion gap: 13 (ref 5–15)
BUN: 50 mg/dL — AB (ref 8–23)
CHLORIDE: 104 mmol/L (ref 98–111)
CO2: 21 mmol/L — AB (ref 22–32)
Calcium: 9.2 mg/dL (ref 8.9–10.3)
Creatinine, Ser: 0.89 mg/dL (ref 0.44–1.00)
GFR calc Af Amer: >60 mL/min (ref >60)
GFR, EST NON AFRICAN AMERICAN: 57 mL/min — AB (ref >60)
Glucose, Bld: 204 mg/dL — ABNORMAL HIGH (ref 70–99)
POTASSIUM: 4.1 mmol/L (ref 3.5–5.1)
SODIUM: 138 mmol/L (ref 135–145)
Total Bilirubin: 0.5 mg/dL (ref 0.3–1.2)
Total Protein: 7.8 g/dL (ref 6.5–8.1)

## 2018-03-12 LAB — PROTIME-INR
INR: 1.1
PROTHROMBIN TIME: 14.1 s (ref 11.4–15.2)

## 2018-03-12 LAB — CBC
HCT: 21.6 % — ABNORMAL LOW (ref 35.0–47.0)
Hemoglobin: 6.8 g/dL — ABNORMAL LOW (ref 12.0–16.0)
MCH: 24.4 pg — ABNORMAL LOW (ref 26.0–34.0)
MCHC: 31.5 g/dL — AB (ref 32.0–36.0)
MCV: 77.4 fL — ABNORMAL LOW (ref 80.0–100.0)
Platelets: 344 10*3/uL (ref 150–440)
RBC: 2.78 MIL/uL — ABNORMAL LOW (ref 3.80–5.20)
RDW: 16.8 % — AB (ref 11.5–14.5)
WBC: 6.4 10*3/uL (ref 3.6–11.0)

## 2018-03-12 LAB — GLUCOSE, CAPILLARY
GLUCOSE-CAPILLARY: 205 mg/dL — AB (ref 70–99)
GLUCOSE-CAPILLARY: 156 mg/dL — AB (ref 70–99)

## 2018-03-12 LAB — PREPARE RBC (CROSSMATCH)

## 2018-03-12 LAB — TROPONIN I

## 2018-03-12 LAB — APTT: APTT: 28 s (ref 24–36)

## 2018-03-12 MED ORDER — PANTOPRAZOLE SODIUM 40 MG IV SOLR
80.0000 mg | Freq: Once | INTRAVENOUS | Status: AC
  Administered 2018-03-12: 80 mg via INTRAVENOUS
  Filled 2018-03-12: qty 80

## 2018-03-12 MED ORDER — SODIUM CHLORIDE 0.9 % IV BOLUS
1000.0000 mL | Freq: Once | INTRAVENOUS | Status: AC
  Administered 2018-03-12: 1000 mL via INTRAVENOUS

## 2018-03-12 MED ORDER — DONEPEZIL HCL 5 MG PO TABS
5.0000 mg | ORAL_TABLET | Freq: Every day | ORAL | Status: DC
  Administered 2018-03-12 – 2018-03-15 (×4): 5 mg via ORAL
  Filled 2018-03-12 (×5): qty 1

## 2018-03-12 MED ORDER — PRIMIDONE 50 MG PO TABS
50.0000 mg | ORAL_TABLET | Freq: Two times a day (BID) | ORAL | Status: DC
  Administered 2018-03-12 – 2018-03-16 (×7): 50 mg via ORAL
  Filled 2018-03-12 (×9): qty 1

## 2018-03-12 MED ORDER — PANTOPRAZOLE SODIUM 40 MG PO TBEC: 40.0000 mg | DELAYED_RELEASE_TABLET | Freq: Two times a day (BID) | ORAL | Status: DC

## 2018-03-12 MED ORDER — VITAMIN D 1000 UNITS PO TABS
2000.0000 [IU] | ORAL_TABLET | Freq: Every day | ORAL | Status: DC
  Administered 2018-03-13 – 2018-03-16 (×3): 2000 [IU] via ORAL
  Filled 2018-03-12 (×3): qty 2

## 2018-03-12 MED ORDER — PRAVASTATIN SODIUM 20 MG PO TABS
40.0000 mg | ORAL_TABLET | Freq: Every day | ORAL | Status: DC
  Administered 2018-03-12 – 2018-03-15 (×4): 40 mg via ORAL
  Filled 2018-03-12 (×4): qty 2

## 2018-03-12 MED ORDER — ALENDRONATE SODIUM 70 MG PO TABS: 70.0000 mg | ORAL_TABLET | ORAL | Status: DC

## 2018-03-12 MED ORDER — ACETAMINOPHEN 325 MG PO TABS
650.0000 mg | ORAL_TABLET | ORAL | Status: DC | PRN
  Administered 2018-03-12: 21:00:00 650 mg via ORAL
  Filled 2018-03-12: qty 2

## 2018-03-12 MED ORDER — DULOXETINE HCL 30 MG PO CPEP
60.0000 mg | ORAL_CAPSULE | Freq: Every day | ORAL | Status: DC
  Administered 2018-03-12 – 2018-03-15 (×4): 60 mg via ORAL
  Filled 2018-03-12 (×4): qty 2

## 2018-03-12 MED ORDER — DOCUSATE SODIUM 100 MG PO CAPS
100.0000 mg | ORAL_CAPSULE | Freq: Two times a day (BID) | ORAL | Status: DC | PRN
  Administered 2018-03-16: 100 mg via ORAL
  Filled 2018-03-12: qty 1

## 2018-03-12 MED ORDER — INSULIN ASPART 100 UNIT/ML ~~LOC~~ SOLN
0.0000 [IU] | Freq: Three times a day (TID) | SUBCUTANEOUS | Status: DC
  Administered 2018-03-13: 10:00:00 1 [IU] via SUBCUTANEOUS
  Administered 2018-03-13 – 2018-03-14 (×2): 2 [IU] via SUBCUTANEOUS
  Administered 2018-03-14 – 2018-03-15 (×2): 1 [IU] via SUBCUTANEOUS
  Administered 2018-03-15: 7 [IU] via SUBCUTANEOUS
  Administered 2018-03-16: 1 [IU] via SUBCUTANEOUS
  Administered 2018-03-16: 13:00:00 5 [IU] via SUBCUTANEOUS
  Filled 2018-03-12 (×8): qty 1

## 2018-03-12 MED ORDER — CARBIDOPA-LEVODOPA 25-100 MG PO TABS
2.0000 | ORAL_TABLET | Freq: Three times a day (TID) | ORAL | Status: DC
  Administered 2018-03-12 – 2018-03-16 (×11): 2 via ORAL
  Filled 2018-03-12 (×13): qty 2

## 2018-03-12 MED ORDER — PREGABALIN 75 MG PO CAPS
150.0000 mg | ORAL_CAPSULE | Freq: Two times a day (BID) | ORAL | Status: DC
  Administered 2018-03-12 – 2018-03-16 (×7): 150 mg via ORAL
  Filled 2018-03-12 (×7): qty 2

## 2018-03-12 MED ORDER — SODIUM CHLORIDE 0.9 % IV SOLN
8.0000 mg/h | INTRAVENOUS | Status: DC
  Administered 2018-03-12 – 2018-03-14 (×4): 8 mg/h via INTRAVENOUS
  Filled 2018-03-12 (×6): qty 80

## 2018-03-12 MED ORDER — SODIUM CHLORIDE 0.9 % IV SOLN: 10.0000 mL/h | Freq: Once | INTRAVENOUS | Status: DC

## 2018-03-12 NOTE — Progress Notes (Signed)
Family Meeting Note  Advance Directive:no  Today a meeting took place with the daughter.  Patient is unable to participate due HI:DUPBDH capacity dementia   The following clinical team members were present during this meeting:MD  The following were discussed:Patient's diagnosis: GI bleed, dementia, Parkinson's disease, anemia, Patient's progosis: Unable to determine and Goals for treatment: Continue present management  Discussed with her daughter, she would like her to have CPR and defibrillator and medications use in adverse event but would not like intubation or ventilatory support.  She would also like to meet with chaplain and consider decision about DNR after talking to her other siblings.  Additional follow-up to be provided: GI  Time spent during discussion:20 minutes  Vaughan Basta, MD

## 2018-03-12 NOTE — ED Notes (Signed)
Per pharmacy will send meds when ready

## 2018-03-12 NOTE — Progress Notes (Signed)
PHARMACIST - PHYSICIAN COMMUNICATION  CONCERNING: P&T Medication Policy Regarding Oral Bisphosphonates  RECOMMENDATION: Your order for alendronate (Fosamax), ibandronate (Boniva), or risedronate (Actonel) has been discontinued at this time.  If the patient's post-hospital medical condition warrants safe use of this class of drugs, please resume the pre-hospital regimen upon discharge.  DESCRIPTION:  Alendronate (Fosamax), ibandronate (Boniva), and risedronate (Actonel) can cause severe esophageal erosions in patients who are unable to remain upright at least 30 minutes after taking this medication.   Since brief interruptions in therapy are thought to have minimal impact on bone mineral density, the [place] has established that bisphosphonate orders should be routinely discontinued during hospitalization.   To override this safety policy and permit administration of Boniva, Fosamax, or Actonel in the hospital, prescribers must write "DO NOT HOLD" in the comments section when placing the order for this class of medications.  Pernell Dupre, PharmD, BCPS Clinical Pharmacist 03/12/2018 6:46 PM

## 2018-03-12 NOTE — ED Provider Notes (Signed)
[facility] Emergency Department Provider Note    First MD Initiated Contact with Patient 03/12/18 1622     (approximate)  I have reviewed the triage vital signs and the nursing notes.   HISTORY  Chief Complaint Altered Mental Status  Level V Caveat:  dementia  HPI Carrie Bishop is a 84 y.o. female extensive past medical history as listed below presents the ER with increasing lightheadedness particularly with standing since last night.  Patient typically uses a walker or wheelchair for mobility at home and lives with her daughter.  Last night she had a fall when she was trying to get to her commode.  Does not remember hitting her head.  Daughter checked her out and felt like she was otherwise improving and was arranging follow-up with her PCP.  States that she was planning to have her follow-up tomorrow but was still having dizziness and lightheadedness.  Denies any numbness or tingling.  No chest pain.  No fevers at home.  No cough.    Past Medical History:  Diagnosis Date  . Depression   . Diabetes mellitus without complication ([place])   . Hip fracture [facility])    left March 2017  . Hypertension   . Neuropathy   . Osteoporosis   . Restless leg syndrome   . Stroke ([place])   . Tremor    Family History  Problem Relation Age of Onset  . Hypertension Other    Past Surgical History:  Procedure Laterality Date  . ABDOMINAL HYSTERECTOMY    . arm surgery    . INTRAMEDULLARY (IM) NAIL INTERTROCHANTERIC Left 10/21/2015   Procedure: INTRAMEDULLARY (IM) NAIL INTERTROCHANTRIC LEFT LONG AFFIXUS;  Surgeon: Hessie Knows, MD;  Location: [facility] ORS;  Service: Orthopedics;  Laterality: Left;  . INTRAMEDULLARY (IM) NAIL INTERTROCHANTERIC Right 03/31/2016   Procedure: INTRAMEDULLARY (IM) NAIL INTERTROCHANTRIC;  Surgeon: Dereck Leep, MD;  Location: [facility] ORS;  Service: Orthopedics;  Laterality: Right;  . LEG SURGERY     Patient Active Problem List   Diagnosis Date Noted    . Chest pain 06/17/2017  . Closed intratrochanteric fracture of right femur 04/03/2016  . S/P ORIF (open reduction internal fixation) fracture 04/03/2016  . Diabetes ([place]) 04/03/2016  . Essential hypertension 04/03/2016  . Essential tremor 04/03/2016  . Acute posthemorrhagic anemia 04/03/2016  . Hyponatremia 04/03/2016  . Closed intertrochanteric fracture of femur ([place]) 03/30/2016  . Osteoporosis 11/07/2015  . Insomnia 11/07/2015  . Neuropathic pain 11/07/2015  . Depression 11/07/2015  . DM type 2 (diabetes mellitus, type 2) ([place]) 11/01/2015  . Intertrochanteric fracture of left hip ([place]) 10/20/2015  . Chronic renal insufficiency 10/20/2015  . Anemia 10/20/2015  . Closed left hip fracture ([place]) 10/20/2015  . Benign essential tremor 09/20/2015      Prior to Admission medications   Medication Sig Start Date End Date Taking? Authorizing Provider  acetaminophen (TYLENOL) 325 MG tablet Take 650 mg every 4 (four) hours as needed by mouth for mild pain.    [provider]  alendronate (FOSAMAX) 70 MG tablet Take 70 mg by mouth once a week. Take with a full glass of water on an empty stomach. On Fridays.    [provider]  carbidopa-levodopa (SINEMET IR) 25-100 MG tablet Take 1 tablet 3 (three) times daily by mouth. 05/27/17   [provider]  cholecalciferol (VITAMIN D) 1000 units tablet Take 1,000 Units by mouth daily.    [provider]  donepezil (ARICEPT) 5 MG tablet Take  5 mg at bedtime by mouth. 06/03/17   [provider]  DULoxetine (CYMBALTA) 60 MG capsule Take 60 mg at bedtime by mouth.     [provider]  gabapentin (NEURONTIN) 600 MG tablet Take 1,200 mg 2 (two) times daily by mouth.     [provider]  lisinopril (PRINIVIL,ZESTRIL) 40 MG tablet Take 1 tablet by mouth at bedtime.  09/26/15   [provider]  metFORMIN (GLUCOPHAGE-XR) 500 MG 24 hr tablet Take 500 mg 2 (two) times daily by mouth.  05/27/17    [provider]  pantoprazole (PROTONIX) 40 MG tablet Take 1 tablet (40 mg total) by mouth daily. 06/18/17   Hillary Bow, MD  pravastatin (PRAVACHOL) 40 MG tablet Take 40 mg daily by mouth. 06/05/17   [provider]  primidone (MYSOLINE) 50 MG tablet Take 50-100 mg 2 (two) times daily by mouth. 100 mg in the morning and 50 mg at bedtime    [provider]  traMADol (ULTRAM) 50 MG tablet Take 1-2 tablets (50-100 mg total) by mouth every 4 (four) hours as needed for moderate pain. 04/02/16   Duanne Guess, PA-C    Allergies Patient has no known allergies.    Social History Social History   Tobacco Use  . Smoking status: Never Smoker  . Smokeless tobacco: Never Used  Substance Use Topics  . Alcohol use: No  . Drug use: No    Review of Systems Patient denies headaches, rhinorrhea, blurry vision, numbness, shortness of breath, chest pain, edema, cough, abdominal pain, nausea, vomiting, diarrhea, dysuria, fevers, rashes or hallucinations unless otherwise stated above in HPI. ____________________________________________   PHYSICAL EXAM:  VITAL SIGNS: Vitals:   03/12/18 1559 03/12/18 1636  BP: (!) 96/43   Pulse:    Resp:    Temp:  97.9 F (36.6 C)  SpO2:      Constitutional: Alert and oriented. Frail appearing Eyes: Conjunctivae are normal.  Head: Atraumatic. Nose: No congestion/rhinnorhea. Mouth/Throat: Mucous membranes are moist.   Neck: No stridor. Painless ROM.  Cardiovascular: Normal rate, regular rhythm. Grossly normal heart sounds.  Good peripheral circulation. Respiratory: Normal respiratory effort.  No retractions. Lungs CTAB. Gastrointestinal: Soft and nontender. No distention. No abdominal bruits. No CVA tenderness. Genitourinary: deferred Musculoskeletal: No lower extremity tenderness nor edema.  No joint effusions. Neurologic:  Normal speech and language. No gross focal neurologic deficits are appreciated. No facial droop   Resting tremor Skin:  Skin is warm, dry and intact. No rash noted. Psychiatric: Mood and affect are normal. Speech and behavior are normal.  ____________________________________________   LABS (all labs ordered are listed, but only abnormal results are displayed)  Results for orders placed or performed during the hospital encounter of 03/12/18 (from the past 24 hour(s))  Protime-INR     Status: None   Collection Time: 03/12/18  4:04 PM  Result Value Ref Range   Prothrombin Time 14.1 11.4 - 15.2 seconds   INR 1.10   APTT     Status: None   Collection Time: 03/12/18  4:04 PM  Result Value Ref Range   aPTT 28 24 - 36 seconds  CBC     Status: Abnormal   Collection Time: 03/12/18  4:04 PM  Result Value Ref Range   WBC 6.4 3.6 - 11.0 K/uL   RBC 2.78 (L) 3.80 - 5.20 MIL/uL   Hemoglobin 6.8 (L) 12.0 - 16.0 g/dL   HCT 21.6 (L) 35.0 - 47.0 %   MCV 77.4 (L)  80.0 - 100.0 fL   MCH 24.4 (L) 26.0 - 34.0 pg   MCHC 31.5 (L) 32.0 - 36.0 g/dL   RDW 16.8 (H) 11.5 - 14.5 %   Platelets 344 150 - 440 K/uL  Differential     Status: None   Collection Time: 03/12/18  4:04 PM  Result Value Ref Range   Neutrophils Relative % 62 %   Neutro Abs 4.0 1.4 - 6.5 K/uL   Lymphocytes Relative 27 %   Lymphs Abs 1.8 1.0 - 3.6 K/uL   Monocytes Relative 8 %   Monocytes Absolute 0.5 0.2 - 0.9 K/uL   Eosinophils Relative 1 %   Eosinophils Absolute 0.1 0 - 0.7 K/uL   Basophils Relative 2 %   Basophils Absolute 0.1 0 - 0.1 K/uL  Comprehensive metabolic panel     Status: Abnormal   Collection Time: 03/12/18  4:04 PM  Result Value Ref Range   Sodium 138 135 - 145 mmol/L   Potassium 4.1 3.5 - 5.1 mmol/L   Chloride 104 98 - 111 mmol/L   CO2 21 (L) 22 - 32 mmol/L   Glucose, Bld 204 (H) 70 - 99 mg/dL   BUN 50 (H) 8 - 23 mg/dL   Creatinine, Ser 0.89 0.44 - 1.00 mg/dL   Calcium 9.2 8.9 - 10.3 mg/dL   Total Protein 7.8 6.5 - 8.1 g/dL   Albumin 4.0 3.5 - 5.0 g/dL   AST 28 15 - 41 U/L   ALT 9 0 - 44 U/L    Alkaline Phosphatase 74 38 - 126 U/L   Total Bilirubin 0.5 0.3 - 1.2 mg/dL   GFR calc non Af Amer 57 (L) >60 mL/min   GFR calc Af Amer >60 >60 mL/min   Anion gap 13 5 - 15  Troponin I     Status: None   Collection Time: 03/12/18  4:04 PM  Result Value Ref Range   Troponin I <0.03 <0.03 ng/mL  Glucose, capillary     Status: Abnormal   Collection Time: 03/12/18  4:43 PM  Result Value Ref Range   Glucose-Capillary 205 (H) 70 - 99 mg/dL   ____________________________________________  EKG My review and personal interpretation at Time: 15:56   Indication: weakness  Rate: 110  Rhythm: sinus Axis: normal Other: non specific st abnormality in inferior leads, no stemi criteria ____________________________________________  RADIOLOGY  I personally reviewed all radiographic images ordered to evaluate for the above acute complaints and reviewed radiology reports and findings.  These findings were personally discussed with the patient.  Please see medical record for radiology report.  ____________________________________________   PROCEDURES  Procedure(s) performed:  .Critical Care Performed by: Merlyn Lot, MD Authorized by: Merlyn Lot, MD   Critical care provider statement:    Critical care time (minutes):  40   Critical care time was exclusive of:  Separately billable procedures and treating other patients   Critical care was necessary to treat or prevent imminent or life-threatening deterioration of the following conditions:  Circulatory failure (GI bleed requiring transfusion)   Critical care was time spent personally by me on the following activities:  Development of treatment plan with patient or surrogate, discussions with consultants, evaluation of patient's response to treatment, examination of patient, obtaining history from patient or surrogate, ordering and performing treatments and interventions, ordering and review of laboratory studies, ordering and review of  radiographic studies, pulse oximetry, re-evaluation of patient's condition and review of old charts      Critical Care  performed: yes ____________________________________________   INITIAL IMPRESSION / ASSESSMENT AND PLAN / ED COURSE  Pertinent labs & imaging results that were available during my care of the patient were reviewed by me and considered in my medical decision making (see chart for details).   DDX: Dehydration, electrolyte abnormality, ACS, CVA, rhabdo, upper GI bleed, acute blood loss anemia, iron deficiency anemia  Carrie Bishop is a 84 y.o. who presents to the ED with symptoms as described above.  Patient with low blood pressure does appear very frail and weak.  She is afebrile mild tachycardia.  Blood work sent for the above differential does show evidence of acute anemia with elevated BUN concerning for GI bleed we will do rectal exam.  CT imaging shows no evidence of acute ischemia or bleed or traumatic injury.  EKG does show some nonspecific changes likely secondary to demand and acute anemia.  Clinical Course as of Mar 12 1722  Wed Mar 12, 2018  1715 Patient with gross melena and guaiac positive stools.  Presentation most clinically consistent with upper GI bleed.  She does have a history of reflux.  Does have low blood pressure given her acute anemia will give blood transfusion after consenting the patient and family at bedside.  Will start on Protonix drip.  Patient will require hospitalization for further evaluation and management.   [PR]    Clinical Course User Index [PR] Merlyn Lot, MD     As part of my medical decision making, I reviewed the following data within the [place] notes reviewed and incorporated, Labs reviewed, notes from prior ED visits and [place] Controlled Substance Database    ____________________________________________   FINAL CLINICAL IMPRESSION(S) / ED DIAGNOSES  Final diagnoses:  Acute upper GI bleed    Acute anemia      NEW MEDICATIONS STARTED DURING THIS VISIT:  New Prescriptions   No medications on file     Note:  This document was prepared using Dragon voice recognition software and may include unintentional dictation errors.    Merlyn Lot, MD 03/12/18 1723

## 2018-03-12 NOTE — Progress Notes (Signed)
Patient admitted to room 119 from the ED with GI bleed. Patient alert to self and place. C/o cold, warm blanket given to patient. Family at bedside. Patient in bed locked at lower position, bedside table, call light and telephone within patient reach.Report received from [place], [place]. Will continue to monitor patient.

## 2018-03-12 NOTE — Progress Notes (Signed)
   03/12/18 2040  Clinical Encounter Type  Visited With Patient  Visit Type Initial;Spiritual support  Referral From Nurse  Consult/Referral To Chaplain  Spiritual Encounters  Spiritual Needs Prayer;Other (Comment)   CH received an OR to help patient complete an AD. The patient's chart notes stated that she has dementia and the doctor did not include her in health care conversations. I went to the patient's room to establish if I felt that a AD was possible. Ms. Arnall was awake and alert. She was emotional when talking about her family. She understood that she was in the hospital but I did not feel she would be able to complete an AD at this time. A follow up visit tomorrow could be helpful to determine if the patient is able to complete an AD.

## 2018-03-12 NOTE — ED Triage Notes (Signed)
Patients daughter states patient fell in the floor. Denies taking blood thinners. Denies hitting head. No LOC. Patient c/o blurry vision this AM around 10am. HX dementia and parkinsons. Daughter states when patient gets stressed she gets more confused.

## 2018-03-12 NOTE — H&P (Signed)
[facility] [place] NAME: Carrie Bishop    MR#:  [id number]  DATE OF BIRTH:  [birth year]  DATE OF ADMISSION:  03/12/2018  PRIMARY CARE PHYSICIAN: Cyndi Bender, PA-C   REQUESTING/REFERRING PHYSICIAN: Quentin Cornwall  CHIEF COMPLAINT:   Chief Complaint  Patient presents with  . Altered Mental Status    HISTORY OF PRESENT ILLNESS: Carrie Bishop  is a 84 y.o. female with a known history of depression, diabetes, hip fracture, hypertension, neuropathy, stroke, tremor, Parkinson's disease-lives with her daughter and walks with a walker at home.  She also has some baseline dementia. For last few weeks she has gradual weakening and yesterday she also fell down while trying to get to the bathroom.  Because of this worsening weakness, daughter called her primary care physician and they were suspecting patient might be having a new stroke. As she continued to get worse, she decided to just bring her to the emergency room instead of waiting till tomorrow to see primary care physician. She was noted to have significant drop in her hemoglobin and stool guaiac was positive by ER physician.  So he ordered for blood transfusion given to hospitalist team for admission for GI bleed. She takes aspirin every day but denies using over-the-counter pain medication or BC powders. Her daughter has noted her stool very dark since long time.  PAST MEDICAL HISTORY:   Past Medical History:  Diagnosis Date  . Depression   . Diabetes mellitus without complication ([place])   . Hip fracture [facility])    left March 2017  . Hypertension   . Neuropathy   . Osteoporosis   . Restless leg syndrome   . Stroke ([place])   . Tremor     PAST SURGICAL HISTORY:  Past Surgical History:  Procedure Laterality Date  . ABDOMINAL HYSTERECTOMY    . arm surgery    . INTRAMEDULLARY (IM) NAIL INTERTROCHANTERIC Left 10/21/2015   Procedure: INTRAMEDULLARY (IM) NAIL INTERTROCHANTRIC LEFT LONG AFFIXUS;   Surgeon: Hessie Knows, MD;  Location: [facility] ORS;  Service: Orthopedics;  Laterality: Left;  . INTRAMEDULLARY (IM) NAIL INTERTROCHANTERIC Right 03/31/2016   Procedure: INTRAMEDULLARY (IM) NAIL INTERTROCHANTRIC;  Surgeon: Dereck Leep, MD;  Location: [facility] ORS;  Service: Orthopedics;  Laterality: Right;  . LEG SURGERY      SOCIAL HISTORY:  Social History   Tobacco Use  . Smoking status: Never Smoker  . Smokeless tobacco: Never Used  Substance Use Topics  . Alcohol use: No    FAMILY HISTORY:  Family History  Problem Relation Age of Onset  . Hypertension Other     DRUG ALLERGIES: No Known Allergies  REVIEW OF SYSTEMS:   CONSTITUTIONAL: No fever, have fatigue or weakness.  EYES: No blurred or double vision.  EARS, NOSE, AND THROAT: No tinnitus or ear pain.  RESPIRATORY: No cough, shortness of breath, wheezing or hemoptysis.  CARDIOVASCULAR: No chest pain, orthopnea, edema.  GASTROINTESTINAL: No nausea, vomiting, diarrhea or abdominal pain.  GENITOURINARY: No dysuria, hematuria.  ENDOCRINE: No polyuria, nocturia,  HEMATOLOGY: No anemia, easy bruising or bleeding SKIN: No rash or lesion. MUSCULOSKELETAL: No joint pain or arthritis.   NEUROLOGIC: No tingling, numbness, weakness.  PSYCHIATRY: No anxiety or depression.   MEDICATIONS AT HOME:  Prior to Admission medications   Medication Sig Start Date End Date Taking? Authorizing Provider  acetaminophen (TYLENOL) 325 MG tablet Take 650 mg every 4 (four) hours as needed by mouth for mild pain.   Yes [provider]  alendronate (FOSAMAX) 70 MG tablet Take 70 mg by mouth every Friday.    Yes [provider]  carbidopa-levodopa (SINEMET IR) 25-100 MG tablet Take 2 tablets by mouth 3 (three) times daily.    Yes [provider]  Cholecalciferol (VITAMIN D3) 2000 units TABS Take 2,000 Units by mouth daily.   Yes [provider]  donepezil (ARICEPT) 5 MG tablet Take 5 mg at bedtime by mouth. 06/03/17  Yes  [provider]  DULoxetine (CYMBALTA) 60 MG capsule Take 60 mg at bedtime by mouth.    Yes [provider]  Insulin Glargine (LANTUS SOLOSTAR) 100 UNIT/ML Solostar Pen Inject 8 Units into the skin at bedtime.   Yes [provider]  lisinopril (PRINIVIL,ZESTRIL) 40 MG tablet Take 40 mg by mouth at bedtime.    Yes [provider]  metFORMIN (GLUCOPHAGE) 500 MG tablet Take 500 mg by mouth 3 (three) times daily.   Yes [provider]  pantoprazole (PROTONIX) 40 MG tablet Take 1 tablet (40 mg total) by mouth daily. Patient taking differently: Take 40 mg by mouth daily after lunch.  06/18/17  Yes Sudini, Alveta Heimlich, MD  pravastatin (PRAVACHOL) 40 MG tablet Take 40 mg by mouth at bedtime.    Yes [provider]  pregabalin (LYRICA) 150 MG capsule Take 150 mg by mouth 2 (two) times daily.   Yes [provider]  primidone (MYSOLINE) 50 MG tablet Take 50 mg by mouth 2 (two) times daily.    Yes [provider]      PHYSICAL EXAMINATION:   VITAL SIGNS: Blood pressure (!) 96/51, pulse 96, temperature 97.9 F (36.6 C), resp. rate (!) 29, height 5\' 4"  (1.626 m), weight 57.6 kg, SpO2 100 %.  GENERAL:  84 y.o.-year-old patient lying in the bed with no acute distress.  EYES: Pupils equal, round, reactive to light and accommodation. No scleral icterus. Extraocular muscles intact.  HEENT: Head atraumatic, normocephalic. Oropharynx and nasopharynx clear.  NECK:  Supple, no jugular venous distention. No thyroid enlargement, no tenderness.  LUNGS: Normal breath sounds bilaterally, no wheezing, rales,rhonchi or crepitation. No use of accessory muscles of respiration.  CARDIOVASCULAR: S1, S2 normal. No murmurs, rubs, or gallops.  ABDOMEN: Soft, nontender, nondistended. Bowel sounds present. No organomegaly or mass.  EXTREMITIES: No pedal edema, cyanosis, or clubbing.  NEUROLOGIC: Cranial nerves II through XII are intact. Muscle strength 4/5 in all  extremities. Sensation intact. Gait not checked.  PSYCHIATRIC: The patient is alert and oriented x 2.  SKIN: No obvious rash, lesion, or ulcer.   LABORATORY PANEL:   CBC Recent Labs  Lab 03/12/18 1604  WBC 6.4  HGB 6.8*  HCT 21.6*  PLT 344  MCV 77.4*  MCH 24.4*  MCHC 31.5*  RDW 16.8*  LYMPHSABS 1.8  MONOABS 0.5  EOSABS 0.1  BASOSABS 0.1   ------------------------------------------------------------------------------------------------------------------  Chemistries  Recent Labs  Lab 03/12/18 1604  NA 138  K 4.1  CL 104  CO2 21*  GLUCOSE 204*  BUN 50*  CREATININE 0.89  CALCIUM 9.2  AST 28  ALT 9  ALKPHOS 74  BILITOT 0.5   ------------------------------------------------------------------------------------------------------------------ estimated creatinine clearance is 39.2 mL/min (by C-G formula based on SCr of 0.89 mg/dL). ------------------------------------------------------------------------------------------------------------------ No results for input(s): TSH, T4TOTAL, T3FREE, THYROIDAB in the last 72 hours.  Invalid input(s): FREET3   Coagulation profile Recent Labs  Lab 03/12/18 1604  INR 1.10   ------------------------------------------------------------------------------------------------------------------- No results for input(s): DDIMER in the last 72 hours. -------------------------------------------------------------------------------------------------------------------  Cardiac Enzymes  Recent Labs  Lab 03/12/18 1604  TROPONINI <0.03   ------------------------------------------------------------------------------------------------------------------ Invalid input(s): POCBNP  ---------------------------------------------------------------------------------------------------------------  Urinalysis    Component Value Date/Time   COLORURINE YELLOW (A) 03/30/2016 1638   APPEARANCEUR CLEAR (A) 03/30/2016 1638   APPEARANCEUR Cloudy  06/01/2013 0220   LABSPEC 1.013 03/30/2016 1638   LABSPEC 1.024 06/01/2013 0220   PHURINE 5.0 03/30/2016 1638   GLUCOSEU NEGATIVE 03/30/2016 1638   GLUCOSEU Negative 06/01/2013 0220   HGBUR NEGATIVE 03/30/2016 1638   BILIRUBINUR NEGATIVE 03/30/2016 1638   BILIRUBINUR Negative 06/01/2013 0220   KETONESUR NEGATIVE 03/30/2016 1638   PROTEINUR NEGATIVE 03/30/2016 1638   NITRITE NEGATIVE 03/30/2016 1638   LEUKOCYTESUR NEGATIVE 03/30/2016 1638   LEUKOCYTESUR 3+ 06/01/2013 0220     RADIOLOGY: Ct Head Wo Contrast  Result Date: 03/12/2018 CLINICAL DATA:  84 year old female with acute head injury following fall. Blurred vision. EXAM: CT HEAD WITHOUT CONTRAST TECHNIQUE: Contiguous axial images were obtained from the base of the skull through the vertex without intravenous contrast. COMPARISON:  06/17/2017 MR and CT, and prior studies FINDINGS: Brain: No evidence of acute infarction, hemorrhage, hydrocephalus, extra-axial collection or mass lesion/mass effect. Atrophy, chronic small-vessel white matter ischemic changes and remote bilateral basal ganglia infarcts again noted. Vascular: Carotid atherosclerotic calcifications again noted. Skull: Normal. Negative for fracture or focal lesion. Sinuses/Orbits: A RIGHT mastoid effusion is again identified. No acute abnormalities. Visualized orbits are unremarkable. Other: None IMPRESSION: 1. No evidence of acute intracranial abnormality 2. Atrophy, chronic small-vessel white matter ischemic changes and remote bilateral basal ganglia infarcts 3. Chronic RIGHT mastoid effusion Electronically Signed   By: Margarette Canada M.D.   On: 03/12/2018 16:24    EKG: Orders placed or performed during the hospital encounter of 03/12/18  . EKG 12-Lead  . EKG 12-Lead  . ED EKG  . ED EKG    IMPRESSION AND PLAN:  *Symptomatic anemia GI bleed   Keep on clear liquid diet, PPI twice daily, follow hemoglobin, GI consult. Avoid anticoagulation at this time. Blood transfusion  is ordered by ER physician.  *Hypertension As of blood pressure is running borderline, I would avoid antihypertensive medications.  *Diabetes As we will keep on clear liquid diet, I will not give any basal insulin or oral medications but keep on sliding scale coverage.  *Hyperlipidemia Continue statin.  *Parkinson's disease Continue carbidopa levodopa.  All the records are reviewed and case discussed with ED provider. Management plans discussed with the patient, family and they are in agreement.  CODE STATUS: limited code. Discussed with her daughter who lives with her, as she wanted to have CPR and defibrillator and medication use but does not want intubation or ventilatory support in adverse event.  She would like to meet with chaplain and again reconsider her decision and may consider making her DNR. Code Status History    Date Active Date Inactive Code Status Order ID Comments User Context   06/17/2017 0442 06/18/2017 1943 Full Code [id number]  Harrie Foreman, MD Inpatient   03/30/2016 1654 04/03/2016 1923 Full Code [id number]  Lytle Butte, MD ED   10/20/2015 2020 10/24/2015 1909 Full Code [id number]  Theodoro Grist, MD Inpatient       TOTAL TIME TAKING CARE OF THIS PATIENT: 50 minutes.    Vaughan Basta M.D on 03/12/2018   Between 7am to 6pm - Pager - [phone]  After 6pm go to [email or web address] - password [email or web address] [facility]  Office  [phone]  CC: Primary care physician; Cyndi Bender, PA-C  Note: This dictation was prepared with Dragon dictation along with smaller phrase technology. Any transcriptional errors that result from this process are unintentional.

## 2018-03-12 NOTE — ED Notes (Signed)
Patient transported to CT 

## 2018-03-13 DIAGNOSIS — D500 Iron deficiency anemia secondary to blood loss (chronic): Secondary | ICD-10-CM

## 2018-03-13 LAB — TYPE AND SCREEN
ABO/RH(D): O POS
Antibody Screen: NEGATIVE
Unit division: 0
Unit division: 0

## 2018-03-13 LAB — BASIC METABOLIC PANEL
ANION GAP: 4 — AB (ref 5–15)
BUN: 39 mg/dL — ABNORMAL HIGH (ref 8–23)
CHLORIDE: 108 mmol/L (ref 98–111)
CO2: 26 mmol/L (ref 22–32)
Calcium: 8.3 mg/dL — ABNORMAL LOW (ref 8.9–10.3)
Creatinine, Ser: 0.66 mg/dL (ref 0.44–1.00)
GFR calc non Af Amer: >60 mL/min (ref >60)
Glucose, Bld: 128 mg/dL — ABNORMAL HIGH (ref 70–99)
Potassium: 4.3 mmol/L (ref 3.5–5.1)
Sodium: 138 mmol/L (ref 135–145)

## 2018-03-13 LAB — CBC
HEMATOCRIT: 26 % — AB (ref 35.0–47.0)
HEMOGLOBIN: 8.9 g/dL — AB (ref 12.0–16.0)
MCH: 27.7 pg (ref 26.0–34.0)
MCHC: 34.4 g/dL (ref 32.0–36.0)
MCV: 80.5 fL (ref 80.0–100.0)
Platelets: 197 10*3/uL (ref 150–440)
RBC: 3.23 MIL/uL — AB (ref 3.80–5.20)
RDW: 16.7 % — ABNORMAL HIGH (ref 11.5–14.5)
WBC: 4.8 10*3/uL (ref 3.6–11.0)

## 2018-03-13 LAB — GLUCOSE, CAPILLARY
GLUCOSE-CAPILLARY: 185 mg/dL — AB (ref 70–99)
GLUCOSE-CAPILLARY: 75 mg/dL (ref 70–99)
Glucose-Capillary: 121 mg/dL — ABNORMAL HIGH (ref 70–99)
Glucose-Capillary: 287 mg/dL — ABNORMAL HIGH (ref 70–99)

## 2018-03-13 LAB — BPAM RBC
Blood Product Expiration Date: 201908182359
Blood Product Expiration Date: 201909012359
ISSUE DATE / TIME: 201908142015
ISSUE DATE / TIME: 201908142338
Unit Type and Rh: 5100
Unit Type and Rh: 5100

## 2018-03-13 LAB — IRON AND TIBC
Iron: 168 ug/dL (ref 28–170)
SATURATION RATIOS: 53 % — AB (ref 10.4–31.8)
TIBC: 319 ug/dL (ref 250–450)
UIBC: 151 ug/dL

## 2018-03-13 LAB — FOLATE: Folate: 28 ng/mL (ref >5.9)

## 2018-03-13 LAB — FERRITIN: Ferritin: 20 ng/mL (ref 11–307)

## 2018-03-13 LAB — VITAMIN B12: VITAMIN B 12: 194 pg/mL (ref 180–914)

## 2018-03-13 MED ORDER — POLYETHYLENE GLYCOL 3350 17 GM/SCOOP PO POWD
1.0000 | Freq: Once | ORAL | Status: AC
  Administered 2018-03-13: 255 g via ORAL
  Filled 2018-03-13: qty 255

## 2018-03-13 MED ORDER — INSULIN ASPART 100 UNIT/ML ~~LOC~~ SOLN
6.0000 [IU] | Freq: Once | SUBCUTANEOUS | Status: AC
  Administered 2018-03-13: 22:00:00 6 [IU] via SUBCUTANEOUS
  Filled 2018-03-13: qty 1

## 2018-03-13 MED ORDER — SODIUM CHLORIDE 0.9 % IV SOLN
510.0000 mg | Freq: Once | INTRAVENOUS | Status: AC
  Administered 2018-03-13: 510 mg via INTRAVENOUS
  Filled 2018-03-13: qty 17

## 2018-03-13 MED ORDER — MAGNESIUM CITRATE PO SOLN
1.0000 | Freq: Once | ORAL | Status: AC
  Administered 2018-03-13: 17:00:00 1 via ORAL
  Filled 2018-03-13: qty 296

## 2018-03-13 NOTE — Plan of Care (Signed)
  Problem: Education: Goal: Knowledge of General Education information will improve Description Including pain rating scale, medication(s)/side effects and non-pharmacologic comfort measures Outcome: Progressing   Problem: Health Behavior/Discharge Planning: Goal: Ability to manage health-related needs will improve Outcome: Progressing   Problem: Clinical Measurements: Goal: Ability to maintain clinical measurements within normal limits will improve Outcome: Progressing Goal: Cardiovascular complication will be avoided Outcome: Progressing   Problem: Activity: Goal: Risk for activity intolerance will decrease Outcome: Progressing   Problem: Nutrition: Goal: Adequate nutrition will be maintained Outcome: Progressing   Problem: Coping: Goal: Level of anxiety will decrease Outcome: Progressing   Problem: Safety: Goal: Ability to remain free from injury will improve Outcome: Progressing   Problem: Skin Integrity: Goal: Risk for impaired skin integrity will decrease Outcome: Progressing Note:  Patient has 2 spots of skin tear on her left arm covered with foam.

## 2018-03-13 NOTE — Consult Note (Addendum)
Carrie Darby, MD [address]  [place]  [place], [place] [address]  Main: [phone]  Fax: [phone] Pager: [phone]   Consultation  Referring Provider:     No ref. provider found Primary Care Physician:  Cyndi Bender, PA-C Primary Gastroenterologist:  Dr. Sherri Sear         Reason for Consultation:     Severe symptomatic anemia, iron deficiency  Date of Admission:  03/12/2018 Date of Consultation:  03/13/2018         HPI:   Carrie Bishop is a 84 y.o. Caucasian female with history of dementia, Parkinson's who was admitted from home after a fall. Patient provides limited history. Patient was becoming progressively weak for the last few weeks. According to the daughter, patient has been hav black stools for a long time. Her hemoglobin on admission was 6.8, MCV 77.4. On admission, Hb was normal at 13.8, normal MCV in 05/2017. She was anemiac in 2017. She received 2 units of PRBCs on admission and responded appropriately. No witnessed episodes of hematemesis, hematochezia or rectal bleeding. She does not have chronic liver disease. Her BUN/creatinine 50/0.89. Iron studies consistent with iron deficiency. Normal folate levels, B-12 pending She is on Protonix as outpatient Patient is started on pantoprazole drip  NSAIDs: none  Antiplts/Anticoagulants/Anti thrombotics: none  GI Procedures: unknown  Past Medical History:  Diagnosis Date  . Depression   . Diabetes mellitus without complication ([place])   . Hip fracture [facility])    left March 2017  . Hypertension   . Neuropathy   . Osteoporosis   . Restless leg syndrome   . Stroke ([place])   . Tremor     Past Surgical History:  Procedure Laterality Date  . ABDOMINAL HYSTERECTOMY    . arm surgery    . INTRAMEDULLARY (IM) NAIL INTERTROCHANTERIC Left 10/21/2015   Procedure: INTRAMEDULLARY (IM) NAIL INTERTROCHANTRIC LEFT LONG AFFIXUS;  Surgeon: Hessie Knows, MD;  Location: [facility] ORS;  Service: Orthopedics;   Laterality: Left;  . INTRAMEDULLARY (IM) NAIL INTERTROCHANTERIC Right 03/31/2016   Procedure: INTRAMEDULLARY (IM) NAIL INTERTROCHANTRIC;  Surgeon: Dereck Leep, MD;  Location: [facility] ORS;  Service: Orthopedics;  Laterality: Right;  . LEG SURGERY      Prior to Admission medications   Medication Sig Start Date End Date Taking? Authorizing Provider  acetaminophen (TYLENOL) 325 MG tablet Take 650 mg every 4 (four) hours as needed by mouth for mild pain.   Yes [provider]  alendronate (FOSAMAX) 70 MG tablet Take 70 mg by mouth every Friday.    Yes [provider]  carbidopa-levodopa (SINEMET IR) 25-100 MG tablet Take 2 tablets by mouth 3 (three) times daily.    Yes [provider]  Cholecalciferol (VITAMIN D3) 2000 units TABS Take 2,000 Units by mouth daily.   Yes [provider]  donepezil (ARICEPT) 5 MG tablet Take 5 mg at bedtime by mouth. 06/03/17  Yes [provider]  DULoxetine (CYMBALTA) 60 MG capsule Take 60 mg at bedtime by mouth.    Yes [provider]  Insulin Glargine (LANTUS SOLOSTAR) 100 UNIT/ML Solostar Pen Inject 8 Units into the skin at bedtime.   Yes [provider]  lisinopril (PRINIVIL,ZESTRIL) 40 MG tablet Take 40 mg by mouth at bedtime.    Yes [provider]  metFORMIN (GLUCOPHAGE) 500 MG tablet Take 500 mg by mouth 3 (three) times daily.   Yes [provider]  pantoprazole (PROTONIX) 40 MG tablet Take 1  tablet (40 mg total) by mouth daily. Patient taking differently: Take 40 mg by mouth daily after lunch.  06/18/17  Yes Sudini, Alveta Heimlich, MD  pravastatin (PRAVACHOL) 40 MG tablet Take 40 mg by mouth at bedtime.    Yes [provider]  pregabalin (LYRICA) 150 MG capsule Take 150 mg by mouth 2 (two) times daily.   Yes [provider]  primidone (MYSOLINE) 50 MG tablet Take 50 mg by mouth 2 (two) times daily.    Yes [provider]    Family History  Problem Relation Age  of Onset  . Hypertension Other      Social History   Tobacco Use  . Smoking status: Never Smoker  . Smokeless tobacco: Never Used  Substance Use Topics  . Alcohol use: No  . Drug use: No    Allergies as of 03/12/2018  . (No Known Allergies)    Review of Systems:    All systems reviewed and negative except where noted in HPI.   Physical Exam:  Vital signs in last 24 hours: Temp:  [97.5 F (36.4 C)-98.7 F (37.1 C)] 98.3 F (36.8 C) (08/15 1455) Pulse Rate:  [77-132] 77 (08/15 1455) Resp:  [15-36] 20 (08/15 1455) BP: (90-135)/(46-83) 112/66 (08/15 1455) SpO2:  [67 %-100 %] 95 % (08/15 1455)   General:   Pleasant, cooperative in NAD Head:  Normocephalic and atraumatic. Eyes:   No icterus.   Conjunctiva pink. PERRLA. Ears:  Normal auditory acuity. Neck:  Supple; no masses or thyroidomegaly Lungs: Respirations even and unlabored. Lungs clear to auscultation bilaterally.   No wheezes, crackles, or rhonchi.  Heart:  Regular rate and rhythm;  Without murmur, clicks, rubs or gallops Abdomen:  Soft, nondistended, nontender. Normal bowel sounds. No appreciable masses or hepatomegaly.  No rebound or guarding.  Rectal:  Black formed stool present Msk:  Symmetrical without gross deformities.  Strength normal  Extremities:  Without edema, cyanosis or clubbing. Neurologic:  Alert and oriented x3;  grossly normal neurologically. Skin:  Intact without significant lesions or rashes. Cervical Nodes:  No significant cervical adenopathy. Psych:  Alert and cooperative. Normal affect.  LAB RESULTS: CBC Latest Ref Rng & Units 03/13/2018 03/12/2018 06/17/2017  WBC 3.6 - 11.0 K/uL 4.8 6.4 7.7  Hemoglobin 12.0 - 16.0 g/dL 8.9(L) 6.8(L) 13.8  Hematocrit 35.0 - 47.0 % 26.0(L) 21.6(L) 40.2  Platelets 150 - 440 K/uL 197 344 234    BMET BMP Latest Ref Rng & Units 03/13/2018 03/12/2018 06/17/2017  Glucose 70 - 99 mg/dL 128(H) 204(H) 160(H)  BUN 8 - 23 mg/dL 39(H) 50(H) 29(H)  Creatinine 0.44 -  1.00 mg/dL 0.66 0.89 0.74  Sodium 135 - 145 mmol/L 138 138 133(L)  Potassium 3.5 - 5.1 mmol/L 4.3 4.1 3.9  Chloride 98 - 111 mmol/L 108 104 93(L)  CO2 22 - 32 mmol/L 26 21(L) 27  Calcium 8.9 - 10.3 mg/dL 8.3(L) 9.2 9.9    LFT Hepatic Function Latest Ref Rng & Units 03/12/2018 03/30/2016 10/31/2015  Total Protein 6.5 - 8.1 g/dL 7.8 7.2 -  Albumin 3.5 - 5.0 g/dL 4.0 3.9 -  AST 15 - 41 U/L 28 25 65(A)  ALT 0 - 44 U/L 9 14 34  Alk Phosphatase 38 - 126 U/L 74 69 83  Total Bilirubin 0.3 - 1.2 mg/dL 0.5 0.5 -     STUDIES: Ct Head Wo Contrast  Result Date: 03/12/2018 CLINICAL DATA:  84 year old female with acute head injury following fall. Blurred vision. EXAM: CT HEAD  WITHOUT CONTRAST TECHNIQUE: Contiguous axial images were obtained from the base of the skull through the vertex without intravenous contrast. COMPARISON:  06/17/2017 MR and CT, and prior studies FINDINGS: Brain: No evidence of acute infarction, hemorrhage, hydrocephalus, extra-axial collection or mass lesion/mass effect. Atrophy, chronic small-vessel white matter ischemic changes and remote bilateral basal ganglia infarcts again noted. Vascular: Carotid atherosclerotic calcifications again noted. Skull: Normal. Negative for fracture or focal lesion. Sinuses/Orbits: A RIGHT mastoid effusion is again identified. No acute abnormalities. Visualized orbits are unremarkable. Other: None IMPRESSION: 1. No evidence of acute intracranial abnormality 2. Atrophy, chronic small-vessel white matter ischemic changes and remote bilateral basal ganglia infarcts 3. Chronic RIGHT mastoid effusion Electronically Signed   By: Margarette Canada M.D.   On: 03/12/2018 16:24      Impression / Plan:   Carrie Bishop is a 84 y.o. Caucasian female with history of baseline dementia, Parkinson's, diabetes admitted with symptomatic iron deficiency anemia, melena   - clear liquid diet - Continue pantoprazole drip - Recommend EGD and colonoscopy for further evaluation  of iron deficiency anemia - Bowel prep ordered - Monitor CBC - Administer parenteral iron - Nothing by mouth past midnight  Thank you for involving me in the care of this patient.  We will follow along with you    LOS: 1 day   Sherri Sear, MD  03/13/2018, 5:44 PM   Note: This dictation was prepared with Dragon dictation along with smaller phrase technology. Any transcriptional errors that result from this process are unintentional.

## 2018-03-13 NOTE — Progress Notes (Signed)
[facility] [place] NAME: Carrie Bishop    MR#:  [id number]  DATE OF BIRTH:  07/17/1929  SUBJECTIVE:  CHIEF COMPLAINT:   Chief Complaint  Patient presents with  . Altered Mental Status   No further bleeding Clear liquid diet  REVIEW OF SYSTEMS:    Review of Systems  Constitutional: Positive for malaise/fatigue. Negative for chills and fever.  HENT: Negative for sore throat.   Eyes: Negative for blurred vision, double vision and pain.  Respiratory: Negative for cough, hemoptysis, shortness of breath and wheezing.   Cardiovascular: Negative for chest pain, palpitations, orthopnea and leg swelling.  Gastrointestinal: Negative for abdominal pain, constipation, diarrhea, heartburn, nausea and vomiting.  Genitourinary: Negative for dysuria and hematuria.  Musculoskeletal: Negative for back pain and joint pain.  Skin: Negative for rash.  Neurological: Negative for sensory change, speech change, focal weakness and headaches.  Endo/Heme/Allergies: Does not bruise/bleed easily.  Psychiatric/Behavioral: Negative for depression. The patient is not nervous/anxious.     DRUG ALLERGIES:  No Known Allergies  VITALS:  Blood pressure 112/66, pulse 77, temperature 98.3 F (36.8 C), temperature source Oral, resp. rate 20, height 5\' 4"  (1.626 m), weight 57.6 kg, SpO2 95 %.  PHYSICAL EXAMINATION:   Physical Exam  GENERAL:  84 y.o.-year-old patient lying in the bed with no acute distress.  EYES: Pupils equal, round, reactive to light and accommodation. No scleral icterus. Extraocular muscles intact.  HEENT: Head atraumatic, normocephalic. Oropharynx and nasopharynx clear.  NECK:  Supple, no jugular venous distention. No thyroid enlargement, no tenderness.  LUNGS: Normal breath sounds bilaterally, no wheezing, rales, rhonchi. No use of accessory muscles of respiration.  CARDIOVASCULAR: S1, S2 normal. No murmurs, rubs, or gallops.  ABDOMEN: Soft,  nontender, nondistended. Bowel sounds present. No organomegaly or mass.  EXTREMITIES: No cyanosis, clubbing or edema b/l.    NEUROLOGIC: Cranial nerves II through XII are intact. No focal Motor or sensory deficits b/l.   PSYCHIATRIC: The patient is alert and oriented x 3.  SKIN: No obvious rash, lesion, or ulcer.   LABORATORY PANEL:   CBC Recent Labs  Lab 03/13/18 0519  WBC 4.8  HGB 8.9*  HCT 26.0*  PLT 197   ------------------------------------------------------------------------------------------------------------------ Chemistries  Recent Labs  Lab 03/12/18 1604 03/13/18 0519  NA 138 138  K 4.1 4.3  CL 104 108  CO2 21* 26  GLUCOSE 204* 128*  BUN 50* 39*  CREATININE 0.89 0.66  CALCIUM 9.2 8.3*  AST 28  --   ALT 9  --   ALKPHOS 74  --   BILITOT 0.5  --    ------------------------------------------------------------------------------------------------------------------  Cardiac Enzymes Recent Labs  Lab 03/12/18 1604  TROPONINI <0.03   ------------------------------------------------------------------------------------------------------------------  RADIOLOGY:  Ct Head Wo Contrast  Result Date: 03/12/2018 CLINICAL DATA:  84 year old female with acute head injury following fall. Blurred vision. EXAM: CT HEAD WITHOUT CONTRAST TECHNIQUE: Contiguous axial images were obtained from the base of the skull through the vertex without intravenous contrast. COMPARISON:  06/17/2017 MR and CT, and prior studies FINDINGS: Brain: No evidence of acute infarction, hemorrhage, hydrocephalus, extra-axial collection or mass lesion/mass effect. Atrophy, chronic small-vessel white matter ischemic changes and remote bilateral basal ganglia infarcts again noted. Vascular: Carotid atherosclerotic calcifications again noted. Skull: Normal. Negative for fracture or focal lesion. Sinuses/Orbits: A RIGHT mastoid effusion is again identified. No acute abnormalities. Visualized orbits are  unremarkable. Other: None IMPRESSION: 1. No evidence of acute intracranial abnormality 2. Atrophy, chronic small-vessel white  matter ischemic changes and remote bilateral basal ganglia infarcts 3. Chronic RIGHT mastoid effusion Electronically Signed   By: Margarette Canada M.D.   On: 03/12/2018 16:24     ASSESSMENT AND PLAN:   * Acute blood loss anemia S/p 2 units PRBC Improved Repeat labs in AM  EGD/Colonoscopy in AM IV iron ordered  *Hypertension Meds held  *Diabetes SSI  *Hyperlipidemia Continue statin.  *Parkinson's disease Continue carbidopa levodopa.  All the records are reviewed and case discussed with Care Management/Social Workerr. Management plans discussed with the patient, family and they are in agreement.  CODE STATUS: FULL CODE  DVT Prophylaxis: SCDs  TOTAL TIME TAKING CARE OF THIS PATIENT: 35 minutes.   POSSIBLE D/C IN 1-2 DAYS, DEPENDING ON CLINICAL CONDITION.  Neita Carp M.D on 03/13/2018 at 6:24 PM  Between 7am to 6pm - Pager - [phone]  After 6pm go to [email or web address] - password [email or web address] [facility]  Office  [phone]  CC: Primary care physician; Cyndi Bender, PA-C  Note: This dictation was prepared with Dragon dictation along with smaller phrase technology. Any transcriptional errors that result from this process are unintentional.

## 2018-03-14 ENCOUNTER — Inpatient Hospital Stay: Payer: Medicare HMO | Admitting: Anesthesiology

## 2018-03-14 ENCOUNTER — Encounter
Admission: EM | Disposition: A | Discharge status: Home / Self Care | Payer: Self-pay | Source: Home / Self Care | Attending: Internal Medicine

## 2018-03-14 ENCOUNTER — Encounter: Payer: Self-pay | Admitting: Anesthesiology

## 2018-03-14 DIAGNOSIS — K449 Diaphragmatic hernia without obstruction or gangrene: Secondary | ICD-10-CM

## 2018-03-14 DIAGNOSIS — K648 Other hemorrhoids: Secondary | ICD-10-CM

## 2018-03-14 HISTORY — PX: COLONOSCOPY WITH PROPOFOL: SHX5780

## 2018-03-14 HISTORY — PX: ESOPHAGOGASTRODUODENOSCOPY (EGD) WITH PROPOFOL: SHX5813

## 2018-03-14 LAB — GLUCOSE, CAPILLARY
Glucose-Capillary: 101 mg/dL — ABNORMAL HIGH (ref 70–99)
Glucose-Capillary: 128 mg/dL — ABNORMAL HIGH (ref 70–99)
Glucose-Capillary: 130 mg/dL — ABNORMAL HIGH (ref 70–99)
Glucose-Capillary: 151 mg/dL — ABNORMAL HIGH (ref 70–99)
Glucose-Capillary: 298 mg/dL — ABNORMAL HIGH (ref 70–99)

## 2018-03-14 LAB — HEMOGLOBIN: HEMOGLOBIN: 9.3 g/dL — AB (ref 12.0–16.0)

## 2018-03-14 SURGERY — EGD (ESOPHAGOGASTRODUODENOSCOPY)
Anesthesia: General

## 2018-03-14 SURGERY — ESOPHAGOGASTRODUODENOSCOPY (EGD) WITH PROPOFOL
Anesthesia: General

## 2018-03-14 SURGERY — COLONOSCOPY WITH PROPOFOL
Anesthesia: General

## 2018-03-14 MED ORDER — PROPOFOL 500 MG/50ML IV EMUL
INTRAVENOUS | Status: DC | PRN
  Administered 2018-03-14: 100 ug/kg/min via INTRAVENOUS

## 2018-03-14 MED ORDER — SODIUM CHLORIDE 0.9 % IV SOLN
INTRAVENOUS | Status: DC
  Administered 2018-03-14: 13:00:00 via INTRAVENOUS

## 2018-03-14 MED ORDER — POLYETHYLENE GLYCOL 3350 17 G PO PACK: 17.0000 g | PACK | Freq: Once | ORAL | Status: DC

## 2018-03-14 MED ORDER — BOOST / RESOURCE BREEZE PO LIQD CUSTOM
1.0000 | Freq: Three times a day (TID) | ORAL | Status: DC
  Administered 2018-03-14 – 2018-03-16 (×6): 1 via ORAL

## 2018-03-14 MED ORDER — MAGNESIUM CITRATE PO SOLN: 1.0000 | Freq: Once | ORAL | Status: DC

## 2018-03-14 MED ORDER — PROPOFOL 10 MG/ML IV BOLUS
INTRAVENOUS | Status: DC | PRN
  Administered 2018-03-14: 50 mg via INTRAVENOUS

## 2018-03-14 MED ORDER — MAGNESIUM CITRATE PO SOLN
1.0000 | Freq: Once | ORAL | Status: DC
  Filled 2018-03-14: qty 296

## 2018-03-14 MED ORDER — MAGNESIUM CITRATE PO SOLN
1.0000 | Freq: Once | ORAL | Status: AC
  Administered 2018-03-14: 10:00:00 1 via ORAL
  Filled 2018-03-14: qty 296

## 2018-03-14 MED ORDER — LIDOCAINE HCL (CARDIAC) PF 100 MG/5ML IV SOSY
PREFILLED_SYRINGE | INTRAVENOUS | Status: DC | PRN
  Administered 2018-03-14: 80 mg via INTRAVENOUS

## 2018-03-14 MED ORDER — PHENYLEPHRINE HCL 10 MG/ML IJ SOLN
INTRAMUSCULAR | Status: DC | PRN
  Administered 2018-03-14 (×2): 50 ug via INTRAVENOUS

## 2018-03-14 NOTE — Anesthesia Preprocedure Evaluation (Signed)
Anesthesia Evaluation  Patient identified by MRN, date of birth, ID band Patient awake    Reviewed: Allergy & Precautions, NPO status , Patient's Chart, lab work & pertinent test results  History of Anesthesia Complications Negative for: history of anesthetic complications  Airway Mallampati: II       Dental  (+) Upper Dentures, Lower Dentures, Edentulous Upper, Edentulous Lower   Pulmonary neg pulmonary ROS,    Pulmonary exam normal        Cardiovascular Exercise Tolerance: Poor hypertension, Pt. on medications  Rhythm:Regular Rate:Normal     Neuro/Psych PSYCHIATRIC DISORDERS (Depression) Depression CVA    GI/Hepatic negative GI ROS, Neg liver ROS,   Endo/Other  diabetes, Type 2, Oral Hypoglycemic Agents  Renal/GU Renal InsufficiencyRenal disease     Musculoskeletal   Abdominal Normal abdominal exam  (+)   Peds negative pediatric ROS (+)  Hematology  (+) anemia ,   Anesthesia Other Findings Past Medical History: No date: Depression No date: Diabetes mellitus without complication (HCC) No date: Hip fracture (HCC)     Comment: left March 2017 No date: Hypertension No date: Neuropathy (HCC) No date: Osteoporosis No date: Restless leg syndrome No date: Stroke ([place]) No date: Tremor   Reproductive/Obstetrics negative OB ROS                             Anesthesia Physical  Anesthesia Plan  ASA: III  Anesthesia Plan: General   Post-op Pain Management:    Induction:   PONV Risk Score and Plan:   Airway Management Planned: Nasal Cannula  Additional Equipment:   Intra-op Plan:   Post-operative Plan:   Informed Consent: I have reviewed the patients History and Physical, chart, labs and discussed the procedure including the risks, benefits and alternatives for the proposed anesthesia with the patient or authorized representative who has indicated his/her understanding and  acceptance.     Plan Discussed with: CRNA and Anesthesiologist  Anesthesia Plan Comments:         Anesthesia Quick Evaluation

## 2018-03-14 NOTE — Progress Notes (Signed)
[facility] [place] NAME: Carrie Bishop    MR#:  [id number]  DATE OF BIRTH:  Dec 04, 1928  SUBJECTIVE:  CHIEF COMPLAINT:   Chief Complaint  Patient presents with  . Altered Mental Status   EGD and colonoscopy today with no significant findings.  Hemoglobin stable.  REVIEW OF SYSTEMS:    Review of Systems  Constitutional: Positive for malaise/fatigue. Negative for chills and fever.  HENT: Negative for sore throat.   Eyes: Negative for blurred vision, double vision and pain.  Respiratory: Negative for cough, hemoptysis, shortness of breath and wheezing.   Cardiovascular: Negative for chest pain, palpitations, orthopnea and leg swelling.  Gastrointestinal: Negative for abdominal pain, constipation, diarrhea, heartburn, nausea and vomiting.  Genitourinary: Negative for dysuria and hematuria.  Musculoskeletal: Negative for back pain and joint pain.  Skin: Negative for rash.  Neurological: Negative for sensory change, speech change, focal weakness and headaches.  Endo/Heme/Allergies: Does not bruise/bleed easily.  Psychiatric/Behavioral: Negative for depression. The patient is not nervous/anxious.     DRUG ALLERGIES:  No Known Allergies  VITALS:  Blood pressure 138/69, pulse 72, temperature (!) 96.8 F (36 C), temperature source Tympanic, resp. rate (!) 9, height 5\' 6"  (1.676 m), weight 54 kg, SpO2 97 %.  PHYSICAL EXAMINATION:   Physical Exam  GENERAL:  84 y.o.-year-old patient lying in the bed with no acute distress.  EYES: Pupils equal, round, reactive to light and accommodation. No scleral icterus. Extraocular muscles intact.  HEENT: Head atraumatic, normocephalic. Oropharynx and nasopharynx clear.  NECK:  Supple, no jugular venous distention. No thyroid enlargement, no tenderness.  LUNGS: Normal breath sounds bilaterally, no wheezing, rales, rhonchi. No use of accessory muscles of respiration.  CARDIOVASCULAR: S1, S2 normal. No  murmurs, rubs, or gallops.  ABDOMEN: Soft, nontender, nondistended. Bowel sounds present. No organomegaly or mass.  EXTREMITIES: No cyanosis, clubbing or edema b/l.    NEUROLOGIC: Cranial nerves II through XII are intact. No focal Motor or sensory deficits b/l.   PSYCHIATRIC: The patient is alert and oriented x 3.  SKIN: No obvious rash, lesion, or ulcer.   LABORATORY PANEL:   CBC Recent Labs  Lab 03/13/18 0519 03/14/18 0446  WBC 4.8  --   HGB 8.9* 9.3*  HCT 26.0*  --   PLT 197  --    ------------------------------------------------------------------------------------------------------------------ Chemistries  Recent Labs  Lab 03/12/18 1604 03/13/18 0519  NA 138 138  K 4.1 4.3  CL 104 108  CO2 21* 26  GLUCOSE 204* 128*  BUN 50* 39*  CREATININE 0.89 0.66  CALCIUM 9.2 8.3*  AST 28  --   ALT 9  --   ALKPHOS 74  --   BILITOT 0.5  --    ------------------------------------------------------------------------------------------------------------------  Cardiac Enzymes Recent Labs  Lab 03/12/18 1604  TROPONINI <0.03   ------------------------------------------------------------------------------------------------------------------  RADIOLOGY:  Ct Head Wo Contrast  Result Date: 03/12/2018 CLINICAL DATA:  84 year old female with acute head injury following fall. Blurred vision. EXAM: CT HEAD WITHOUT CONTRAST TECHNIQUE: Contiguous axial images were obtained from the base of the skull through the vertex without intravenous contrast. COMPARISON:  06/17/2017 MR and CT, and prior studies FINDINGS: Brain: No evidence of acute infarction, hemorrhage, hydrocephalus, extra-axial collection or mass lesion/mass effect. Atrophy, chronic small-vessel white matter ischemic changes and remote bilateral basal ganglia infarcts again noted. Vascular: Carotid atherosclerotic calcifications again noted. Skull: Normal. Negative for fracture or focal lesion. Sinuses/Orbits: A RIGHT mastoid  effusion is again identified. No acute abnormalities.  Visualized orbits are unremarkable. Other: None IMPRESSION: 1. No evidence of acute intracranial abnormality 2. Atrophy, chronic small-vessel white matter ischemic changes and remote bilateral basal ganglia infarcts 3. Chronic RIGHT mastoid effusion Electronically Signed   By: Margarette Canada M.D.   On: 03/12/2018 16:24     ASSESSMENT AND PLAN:   * Acute blood loss anemia S/p 2 units PRBC  hemoglobin stable.  EGD and colonoscopy with no significant findings.  Capsule endoscopy recommended by GI which will be done later today or tomorrow.  Repeat blood work in the morning.  *Hypertension Meds held  *Diabetes SSI  *Hyperlipidemia Continue statin.  *Parkinson's disease Continue carbidopa levodopa.  All the records are reviewed and case discussed with Care Management/Social Workerr. Management plans discussed with the patient, family and they are in agreement.  CODE STATUS: FULL CODE  DVT Prophylaxis: SCDs  TOTAL TIME TAKING CARE OF THIS PATIENT: 35 minutes.   POSSIBLE D/C IN 1-2 DAYS, DEPENDING ON CLINICAL CONDITION.  Neita Carp M.D on 03/14/2018 at 2:52 PM  Between 7am to 6pm - Pager - [phone]  After 6pm go to [email or web address] - password [email or web address] [facility]  Office  [phone]  CC: Primary care physician; Cyndi Bender, PA-C  Note: This dictation was prepared with Dragon dictation along with smaller phrase technology. Any transcriptional errors that result from this process are unintentional.

## 2018-03-14 NOTE — Anesthesia Post-op Follow-up Note (Signed)
Anesthesia QCDR form completed.        

## 2018-03-14 NOTE — Anesthesia Preprocedure Evaluation (Signed)
Anesthesia Evaluation  Patient identified by MRN, date of birth, ID band Patient awake    Reviewed: Allergy & Precautions, NPO status , Patient's Chart, lab work & pertinent test results  History of Anesthesia Complications Negative for: history of anesthetic complications  Airway Mallampati: II       Dental  (+) Upper Dentures, Lower Dentures, Edentulous Upper, Edentulous Lower   Pulmonary neg pulmonary ROS,    Pulmonary exam normal        Cardiovascular Exercise Tolerance: Poor hypertension, Pt. on medications  Rhythm:Regular Rate:Normal     Neuro/Psych PSYCHIATRIC DISORDERS (Depression) Depression CVA    GI/Hepatic negative GI ROS, Neg liver ROS,   Endo/Other  diabetes, Type 2, Oral Hypoglycemic Agents  Renal/GU Renal InsufficiencyRenal disease     Musculoskeletal   Abdominal Normal abdominal exam  (+)   Peds negative pediatric ROS (+)  Hematology  (+) anemia ,   Anesthesia Other Findings Past Medical History: No date: Depression No date: Diabetes mellitus without complication (HCC) No date: Hip fracture (HCC)     Comment: left March 2017 No date: Hypertension No date: Neuropathy (HCC) No date: Osteoporosis No date: Restless leg syndrome No date: Stroke ([place]) No date: Tremor   Reproductive/Obstetrics negative OB ROS                             Anesthesia Physical  Anesthesia Plan  ASA: III  Anesthesia Plan: General   Post-op Pain Management:    Induction:   PONV Risk Score and Plan:   Airway Management Planned: Nasal Cannula  Additional Equipment:   Intra-op Plan:   Post-operative Plan:   Informed Consent: I have reviewed the patients History and Physical, chart, labs and discussed the procedure including the risks, benefits and alternatives for the proposed anesthesia with the patient or authorized representative who has indicated his/her understanding and  acceptance.     Plan Discussed with: CRNA and Anesthesiologist  Anesthesia Plan Comments:         Anesthesia Quick Evaluation

## 2018-03-14 NOTE — Care Management Important Message (Signed)
Important Message  Patient Details  Name: Carrie Bishop MRN: [id number] Date of Birth: 22-Feb-1929   Medicare Important Message Given:  Yes    [name] 03/14/2018, 11:32 AM

## 2018-03-14 NOTE — Progress Notes (Signed)
EGD and Colonoscopy post procedure note  EGD did not reveal evidence of melena Large hiatal hernia, no Cameron erosions, otherwise normal exam Biopsies performed  Colonoscopy showed thick brown liquid stool in the entire colon Normal underlying mucosa No evidence of tumor or large polyps  Recs: Perform video capsule endoscopy tonight or tomorrow prior to discharge Full liquid diet Administer a bottle of magnesium citrate at 4 PM prior to swallowing capsule Follow-up with GI after discharge Recommend oral iron as outpatient  Dr. Vicente Males to cover for the weekend   Cephas Darby, MD [address]  [place]  [place], [place] [address]  Main: [phone]  Fax: [phone] Pager: [phone]

## 2018-03-14 NOTE — Op Note (Signed)
[facility] Gastroenterology Patient Name: Carrie Bishop Procedure Date: 03/14/2018 1:13 PM MRN: [id number] Account #: [id number] Date of Birth: June 19, 1929 Admit Type: Inpatient Age: 84 Room: [facility] ENDO ROOM 4 Gender: Female Note Status: Finalized Procedure:            Colonoscopy Indications:          Unexplained iron deficiency anemia Providers:            Lin Landsman MD, MD Referring MD:         Cyndi Bender (Referring MD) Medicines:            Monitored Anesthesia Care Complications:        No immediate complications. Estimated blood loss: None. Procedure:            Pre-Anesthesia Assessment:                       - Prior to the procedure, a History and Physical was                        performed, and patient medications and allergies were                        reviewed. The patient is competent. The risks and                        benefits of the procedure and the sedation options and                        risks were discussed with the patient. All questions                        were answered and informed consent was obtained.                        Patient identification and proposed procedure were                        verified by the physician, the nurse, the                        anesthesiologist, the anesthetist and the technician in                        the pre-procedure area in the procedure room in the                        endoscopy suite. Mental Status Examination: alert and                        oriented. Airway Examination: normal oropharyngeal                        airway and neck mobility. Respiratory Examination:                        clear to auscultation. CV Examination: normal.                        Prophylactic Antibiotics: The patient does not require  prophylactic antibiotics. Prior Anticoagulants: The                        patient has taken no previous anticoagulant or                         antiplatelet agents. ASA Grade Assessment: III - A                        patient with severe systemic disease. After reviewing                        the risks and benefits, the patient was deemed in                        satisfactory condition to undergo the procedure. The                        anesthesia plan was to use monitored anesthesia care                        (MAC). Immediately prior to administration of                        medications, the patient was re-assessed for adequacy                        to receive sedatives. The heart rate, respiratory rate,                        oxygen saturations, blood pressure, adequacy of                        pulmonary ventilation, and response to care were                        monitored throughout the procedure. The physical status                        of the patient was re-assessed after the procedure.                       After obtaining informed consent, the colonoscope was                        passed under direct vision. Throughout the procedure,                        the patient's blood pressure, pulse, and oxygen                        saturations were monitored continuously. The                        Colonoscope was introduced through the anus and                        advanced to the the cecum, identified by appendiceal  orifice and ileocecal valve. The colonoscopy was                        performed with difficulty due to poor bowel prep with                        stool present. Successful completion of the procedure                        was aided by lavage. The patient tolerated the                        procedure well. The quality of the bowel preparation                        was poor. Findings:      Hemorrhoids were found on perianal exam.      Copious quantities of liquid brown stool was found in the entire colon,       precluding visualization. Lavage of the area was performed  using greater       than 500 mL of sterile water, resulting in incomplete clearance with       continued poor visualization.      Underlying mucosa appeared normal      Unable to intubate TI      External and internal hemorrhoids were found during retroflexion. The       hemorrhoids were large. Impression:           - Preparation of the colon was poor.                       - Hemorrhoids found on perianal exam.                       - Stool in the entire examined colon.                       - External and internal hemorrhoids.                       - No specimens collected. Recommendation:       - Return patient to hospital ward for ongoing care.                       - Full liquid diet today.                       - Continue present medications.                       - No upper or lower GI source of bleeding identified                       - To visualize the small bowel, perform video capsule                        endoscopy tomorrow. Procedure Code(s):    --- Professional ---                       [phone], Colonoscopy, flexible; diagnostic, including  collection of specimen(s) by brushing or washing, when                        performed (separate procedure) Diagnosis Code(s):    --- Professional ---                       K64.8, Other hemorrhoids                       D50.9, Iron deficiency anemia, unspecified CPT copyright 2017 [employer]. All rights reserved. The codes documented in this report are preliminary and upon coder review may  be revised to meet current compliance requirements. Dr. Ulyess Mort Lin Landsman MD, MD 03/14/2018 2:09:54 PM This report has been signed electronically. Number of Addenda: 0 Note Initiated On: 03/14/2018 1:13 PM Scope Withdrawal Time: 0 hours 13 minutes 53 seconds  Total Procedure Duration: 0 hours 26 minutes 25 seconds       [facility]

## 2018-03-14 NOTE — Op Note (Signed)
[facility] Gastroenterology Patient Name: Carrie Bishop Procedure Date: 03/14/2018 1:13 PM MRN: [id number] Account #: [id number] Date of Birth: January 12, 1929 Admit Type: Inpatient Age: 84 Room: [facility] ENDO ROOM 4 Gender: Female Note Status: Finalized Procedure:            Upper GI endoscopy Indications:          Unexplained iron deficiency anemia Providers:            Lin Landsman MD, MD Medicines:            Monitored Anesthesia Care Complications:        No immediate complications. Estimated blood loss: None. Procedure:            Pre-Anesthesia Assessment:                       - Prior to the procedure, a History and Physical was                        performed, and patient medications and allergies were                        reviewed. The patient is competent. The risks and                        benefits of the procedure and the sedation options and                        risks were discussed with the patient. All questions                        were answered and informed consent was obtained.                        Patient identification and proposed procedure were                        verified by the physician, the nurse, the                        anesthesiologist, the anesthetist and the technician in                        the pre-procedure area in the procedure room in the                        endoscopy suite. Mental Status Examination: alert and                        oriented. Airway Examination: normal oropharyngeal                        airway and neck mobility. Respiratory Examination:                        clear to auscultation. CV Examination: normal.                        Prophylactic Antibiotics: The patient does not require  prophylactic antibiotics. Prior Anticoagulants: The                        patient has taken no previous anticoagulant or                        antiplatelet agents. ASA Grade Assessment:  III - A                        patient with severe systemic disease. After reviewing                        the risks and benefits, the patient was deemed in                        satisfactory condition to undergo the procedure. The                        anesthesia plan was to use monitored anesthesia care                        (MAC). Immediately prior to administration of                        medications, the patient was re-assessed for adequacy                        to receive sedatives. The heart rate, respiratory rate,                        oxygen saturations, blood pressure, adequacy of                        pulmonary ventilation, and response to care were                        monitored throughout the procedure. The physical status                        of the patient was re-assessed after the procedure.                       After obtaining informed consent, the endoscope was                        passed under direct vision. Throughout the procedure,                        the patient's blood pressure, pulse, and oxygen                        saturations were monitored continuously. The Endoscope                        was introduced through the mouth, and advanced to the                        second part of duodenum. The upper GI endoscopy was  accomplished without difficulty. The patient tolerated                        the procedure well. Findings:      The duodenal bulb, second portion of the duodenum and third portion of       the duodenum were normal. Biopsies for histology were taken with a cold       forceps for evaluation of celiac disease.      A 2 cm hiatal hernia was present.      The gastroesophageal junction and examined esophagus were normal.      The entire examined stomach was normal. Biopsies were taken with a cold       forceps for Helicobacter pylori testing. Impression:           - Normal duodenal bulb, second portion of the  duodenum                        and third portion of the duodenum. Biopsied.                       - 2 cm hiatal hernia.                       - Normal stomach, Biopsied.                       - Normal gastroesophageal junction and esophagus. Recommendation:       - Await pathology results.                       - Proceed with colonoscopy as scheduled                       - See colonoscopy report Procedure Code(s):    --- Professional ---                       [phone], Esophagogastroduodenoscopy, flexible, transoral;                        with biopsy, single or multiple Diagnosis Code(s):    --- Professional ---                       K44.9, Diaphragmatic hernia without obstruction or                        gangrene                       D50.9, Iron deficiency anemia, unspecified CPT copyright 2017 American Medical Association. All rights reserved. The codes documented in this report are preliminary and upon coder review may  be revised to meet current compliance requirements. Dr. Ulyess Mort Lin Landsman MD, MD 03/14/2018 1:31:42 PM This report has been signed electronically. Number of Addenda: 0 Note Initiated On: 03/14/2018 1:13 PM      [facility]

## 2018-03-14 NOTE — Anesthesia Postprocedure Evaluation (Signed)
Anesthesia Post Note  Patient: Carrie Bishop  Procedure(s) Performed: ESOPHAGOGASTRODUODENOSCOPY (EGD) WITH PROPOFOL (N/A ) COLONOSCOPY WITH PROPOFOL (N/A )  Patient location during evaluation: PACU Anesthesia Type: General Level of consciousness: awake and alert and oriented Pain management: pain level controlled Vital Signs Assessment: post-procedure vital signs reviewed and stable Respiratory status: spontaneous breathing Cardiovascular status: blood pressure returned to baseline Anesthetic complications: no     Last Vitals:  Vitals:   03/14/18 1241 03/14/18 1409  BP: 120/86 (!) 111/58  Pulse: 82 70  Resp: 18 16  Temp: 36.4 C (!) 36 C  SpO2: 100% 97%    Last Pain:  Vitals:   03/14/18 1409  TempSrc: Tympanic  PainSc: Asleep                 Alatorre,Javyn Havlin

## 2018-03-14 NOTE — Anesthesia Procedure Notes (Signed)
Performed by: Tatsuo Musial, CRNA Pre-anesthesia Checklist: Patient identified, Emergency Drugs available, Suction available, Patient being monitored and Timeout performed Patient Re-evaluated:Patient Re-evaluated prior to induction Oxygen Delivery Method: Nasal cannula Induction Type: IV induction       

## 2018-03-14 NOTE — Transfer of Care (Signed)
Immediate Anesthesia Transfer of Care Note  Patient: Carrie Bishop  Procedure(s) Performed: ESOPHAGOGASTRODUODENOSCOPY (EGD) WITH PROPOFOL (N/A ) COLONOSCOPY WITH PROPOFOL (N/A )  Patient Location: PACU  Anesthesia Type:General  Level of Consciousness: sedated  Airway & Oxygen Therapy: Patient Spontanous Breathing and Patient connected to nasal cannula oxygen  Post-op Assessment: Report given to RN and Post -op Vital signs reviewed and stable  Post vital signs: Reviewed and stable  Last Vitals:  Vitals Value Taken Time  BP 111/58 03/14/2018  2:10 PM  Temp    Pulse 69 03/14/2018  2:11 PM  Resp 16 03/14/2018  2:11 PM  SpO2 97 % 03/14/2018  2:11 PM  Vitals shown include unvalidated device data.  Last Pain:  Vitals:   03/14/18 1241  TempSrc: Tympanic  PainSc:          Complications: No apparent anesthesia complications

## 2018-03-15 ENCOUNTER — Encounter
Admission: EM | Disposition: A | Discharge status: Home / Self Care | Payer: Self-pay | Source: Home / Self Care | Attending: Internal Medicine

## 2018-03-15 DIAGNOSIS — D509 Iron deficiency anemia, unspecified: Secondary | ICD-10-CM

## 2018-03-15 HISTORY — PX: GIVENS CAPSULE STUDY: SHX5432

## 2018-03-15 LAB — HEMOGLOBIN: Hemoglobin: 8.6 g/dL — ABNORMAL LOW (ref 12.0–16.0)

## 2018-03-15 LAB — GLUCOSE, CAPILLARY
GLUCOSE-CAPILLARY: 322 mg/dL — AB (ref 70–99)
Glucose-Capillary: 140 mg/dL — ABNORMAL HIGH (ref 70–99)
Glucose-Capillary: 95 mg/dL (ref 70–99)
Glucose-Capillary: 98 mg/dL (ref 70–99)

## 2018-03-15 SURGERY — IMAGING PROCEDURE, GI TRACT, INTRALUMINAL, VIA CAPSULE

## 2018-03-15 MED ORDER — SODIUM CHLORIDE 0.9% FLUSH
3.0000 mL | Freq: Two times a day (BID) | INTRAVENOUS | Status: DC
  Administered 2018-03-15 – 2018-03-16 (×3): 3 mL via INTRAVENOUS

## 2018-03-15 NOTE — Progress Notes (Signed)
   03/15/18 1400  Clinical Encounter Type  Visited With Patient  Visit Type Initial  Referral From Nurse  Consult/Referral To Chaplain  Spiritual Encounters  Spiritual Needs Prayer;Emotional  Stress Factors  Patient Stress Factors Health changes  Family Stress Factors None identified   I received an (OR) for prayer and pastoral presence for Mrs. Carrie Bishop. I presented to the room and identified myself as the [facility]). She is hard of hearing, but was welcomed in. I took a seat to her right/bedside. Pastoral presence ensued. A nurse soon entered and was welcomed in. she took vitals as I was speaking to Mrs. Carrie Bishop. We spoke of the pill-cam endoscopic procedure she was having, and her concerns over many months with fatigue and the occasional passing of melanotic stools. Due to her fatigue she wasn't able to participate in church as much as she had in the past, but her pastor would come visit her in the home. At this point no church members or her pastor had visited her in the hospital. She remained hopeful of getting answers from the test. She is a God-fearing praying woman, Identified as Baptist from an early childhood. I asked her if she wanted to pray, she acknowledged yes, but no specific prayer request was voiced. Prayer commenced, and the visit concluded with salutations of peace, comfort, and healing upon Carrie Bishop prior to leaving, prior to exiting she asked to please come back.

## 2018-03-15 NOTE — Progress Notes (Signed)
GI camera given at unknown time with no instructions received from ENDO. Page Dr. Vicente Males and notified that mag citrate was not given since camera was started prior to scheduled time of administration and no nursing instructions on diet restart given with new order obtained. Pt tolerated full liquid diet well. No sign/symptom bleeding noted. Resting quietly without co's. Significant parkinson's tremors that interfere with activities.

## 2018-03-15 NOTE — Progress Notes (Signed)
Capsule study initiated at unknown time. Magnesium citrate not given. Verified with Dr. Vicente Males (GI on call) that magnesium citrate should only be given prior to study and to not give at this time. Primary RN made aware. Madlyn Frankel, RN

## 2018-03-16 DIAGNOSIS — D509 Iron deficiency anemia, unspecified: Secondary | ICD-10-CM

## 2018-03-16 LAB — CBC
HCT: 28.8 % — ABNORMAL LOW (ref 35.0–47.0)
HEMOGLOBIN: 9.7 g/dL — AB (ref 12.0–16.0)
MCH: 27.9 pg (ref 26.0–34.0)
MCHC: 33.8 g/dL (ref 32.0–36.0)
MCV: 82.6 fL (ref 80.0–100.0)
PLATELETS: 235 10*3/uL (ref 150–440)
RBC: 3.49 MIL/uL — AB (ref 3.80–5.20)
RDW: 18.5 % — ABNORMAL HIGH (ref 11.5–14.5)
WBC: 6.1 10*3/uL (ref 3.6–11.0)

## 2018-03-16 LAB — GLUCOSE, CAPILLARY
GLUCOSE-CAPILLARY: 169 mg/dL — AB (ref 70–99)
GLUCOSE-CAPILLARY: 254 mg/dL — AB (ref 70–99)

## 2018-03-16 MED ORDER — FERROUS SULFATE 325 (65 FE) MG PO TBEC: 325.0000 mg | DELAYED_RELEASE_TABLET | Freq: Two times a day (BID) | ORAL | 3 refills | Status: AC

## 2018-03-16 NOTE — Discharge Instructions (Signed)
Anemia Anemia is a condition in which you do not have enough red blood cells or hemoglobin. Hemoglobin is a substance in red blood cells that carries oxygen. When you do not have enough red blood cells or hemoglobin (are anemic), your body cannot get enough oxygen and your organs may not work properly. As a result, you may feel very tired or have other problems. What are the causes? Common causes of anemia include:  Excessive bleeding. Anemia can be caused by excessive bleeding inside or outside the body, including bleeding from the intestine or from periods in women.  Poor nutrition.  Long-lasting (chronic) kidney, thyroid, and liver disease.  Bone marrow disorders.  Cancer and treatments for cancer.  HIV (human immunodeficiency virus) and AIDS (acquired immunodeficiency syndrome).  Treatments for HIV and AIDS.  Spleen problems.  Blood disorders.  Infections, medicines, and autoimmune disorders that destroy red blood cells.  What are the signs or symptoms? Symptoms of this condition include:  Minor weakness.  Dizziness.  Headache.  Feeling heartbeats that are irregular or faster than normal (palpitations).  Shortness of breath, especially with exercise.  Paleness.  Cold sensitivity.  Indigestion.  Nausea.  Difficulty sleeping.  Difficulty concentrating.  Symptoms may occur suddenly or develop slowly. If your anemia is mild, you may not have symptoms. How is this diagnosed? This condition is diagnosed based on:  Blood tests.  Your medical history.  A physical exam.  Bone marrow biopsy.  Your health care provider may also check your stool (feces) for blood and may do additional testing to look for the cause of your bleeding. You may also have other tests, including:  Imaging tests, such as a CT scan or MRI.  Endoscopy.  Colonoscopy.  How is this treated? Treatment for this condition depends on the cause. If you continue to lose a lot of  blood, you may need to be treated at a hospital. Treatment may include:  Taking supplements of iron, vitamin O16, or folic acid.  Taking a hormone medicine (erythropoietin) that can help to stimulate red blood cell growth.  Having a blood transfusion. This may be needed if you lose a lot of blood.  Making changes to your diet.  Having surgery to remove your spleen.  Follow these instructions at home:  Take over-the-counter and prescription medicines only as told by your health care provider.  Take supplements only as told by your health care provider.  Follow any diet instructions that you were given.  Keep all follow-up visits as told by your health care provider. This is important. Contact a health care provider if:  You develop new bleeding anywhere in the body. Get help right away if:  You are very weak.  You are short of breath.  You have pain in your abdomen or chest.  You are dizzy or feel faint.  You have trouble concentrating.  You have bloody or black, tarry stools.  You vomit repeatedly or you vomit up blood. Summary  Anemia is a condition in which you do not have enough red blood cells or enough of a substance in your red blood cells that carries oxygen (hemoglobin).  Symptoms may occur suddenly or develop slowly.  If your anemia is mild, you may not have symptoms.  This condition is diagnosed with blood tests as well as a medical history and physical exam. Other tests may be needed.  Treatment for this condition depends on the cause of the anemia. This information is not intended to  advice given to you by your health care provider. Make sure you discuss any questions you have with your health care provider. Document Released: 08/23/2004 Document Revised: 08/17/2016 Document Reviewed: 08/17/2016 Elsevier Interactive Patient Education  2018 [employer]. Need to follow with GI in [facility] clinic.

## 2018-03-16 NOTE — Progress Notes (Signed)
[place] [facility] [place] NAME: Carrie Bishop    MR#:  [id number]  DATE OF BIRTH:  May 17, 1929  SUBJECTIVE:  CHIEF COMPLAINT:   Chief Complaint  Patient presents with  . Altered Mental Status   EGD and colonoscopy done with no significant findings. Hemoglobin stable. Have capsule endoscopy on.  REVIEW OF SYSTEMS:    Review of Systems  Constitutional: Positive for malaise/fatigue. Negative for chills and fever.  HENT: Negative for sore throat.   Eyes: Negative for blurred vision, double vision and pain.  Respiratory: Negative for cough, hemoptysis, shortness of breath and wheezing.   Cardiovascular: Negative for chest pain, palpitations, orthopnea and leg swelling.  Gastrointestinal: Negative for abdominal pain, constipation, diarrhea, heartburn, nausea and vomiting.  Genitourinary: Negative for dysuria and hematuria.  Musculoskeletal: Negative for back pain and joint pain.  Skin: Negative for rash.  Neurological: Negative for sensory change, speech change, focal weakness and headaches.  Endo/Heme/Allergies: Does not bruise/bleed easily.  Psychiatric/Behavioral: Negative for depression. The patient is not nervous/anxious.     DRUG ALLERGIES:  No Known Allergies  VITALS:  Blood pressure (!) 158/86, pulse 77, temperature 98.1 F (36.7 C), temperature source Oral, resp. rate 18, height 5\' 6"  (1.676 m), weight 54 kg, SpO2 98 %.  PHYSICAL EXAMINATION:   Physical Exam  GENERAL:  84 y.o.-year-old patient lying in the bed with no acute distress.  EYES: Pupils equal, round, reactive to light and accommodation. No scleral icterus. Extraocular muscles intact.  HEENT: Head atraumatic, normocephalic. Oropharynx and nasopharynx clear.  NECK:  Supple, no jugular venous distention. No thyroid enlargement, no tenderness.  LUNGS: Normal breath sounds bilaterally, no wheezing, rales, rhonchi. No use of accessory muscles of respiration.  CARDIOVASCULAR:  S1, S2 normal. No murmurs, rubs, or gallops.  ABDOMEN: Soft, nontender, nondistended. Bowel sounds present. No organomegaly or mass.  EXTREMITIES: No cyanosis, clubbing or edema b/l.    NEUROLOGIC: Cranial nerves II through XII are intact. No focal Motor or sensory deficits b/l.   PSYCHIATRIC: The patient is alert and oriented x 3.  SKIN: No obvious rash, lesion, or ulcer.   LABORATORY PANEL:   CBC Recent Labs  Lab 03/13/18 0519  03/15/18 0344  WBC 4.8  --   --   HGB 8.9*   < > 8.6*  HCT 26.0*  --   --   PLT 197  --   --    < > = values in this interval not displayed.   ------------------------------------------------------------------------------------------------------------------ Chemistries  Recent Labs  Lab 03/12/18 1604 03/13/18 0519  NA 138 138  K 4.1 4.3  CL 104 108  CO2 21* 26  GLUCOSE 204* 128*  BUN 50* 39*  CREATININE 0.89 0.66  CALCIUM 9.2 8.3*  AST 28  --   ALT 9  --   ALKPHOS 74  --   BILITOT 0.5  --    ------------------------------------------------------------------------------------------------------------------  Cardiac Enzymes Recent Labs  Lab 03/12/18 1604  TROPONINI <0.03   ------------------------------------------------------------------------------------------------------------------  RADIOLOGY:  No results found.   ASSESSMENT AND PLAN:   * Acute blood loss anemia S/p 2 units PRBC  hemoglobin stable.  EGD and colonoscopy with no significant findings.  Capsule endoscopy recommended by GI .  Repeat hb stable.  *Hypertension Meds held  *Diabetes SSI  *Hyperlipidemia Continue statin.  *Parkinson's disease Continue carbidopa levodopa.  All the records are reviewed and case discussed with Care Management/Social Workerr. Management plans discussed with the patient, family and they are in  agreement.  CODE STATUS: FULL CODE  DVT Prophylaxis: SCDs  TOTAL TIME TAKING CARE OF THIS PATIENT: 35 minutes.   POSSIBLE D/C IN  1-2 DAYS, DEPENDING ON CLINICAL CONDITION.  Vaughan Basta M.D on 03/16/2018 at 9:51 AM  Between 7am to 6pm - Pager - [phone]  After 6pm go to [email or web address] - password [email or web address] [facility]  Office  [phone]  CC: Primary care physician; Carrie Bender, PA-C  Note: This dictation was prepared with Dragon dictation along with smaller phrase technology. Any transcriptional errors that result from this process are unintentional.

## 2018-03-16 NOTE — Discharge Summary (Signed)
[place] [facility] [place] NAME: Carrie Bishop    MR#:  [id number]  DATE OF BIRTH:  1928/12/21  DATE OF ADMISSION:  03/12/2018 ADMITTING PHYSICIAN: Vaughan Basta, MD  DATE OF DISCHARGE: 03/16/2018   PRIMARY CARE PHYSICIAN: Cyndi Bender, PA-C    ADMISSION DIAGNOSIS:  Acute upper GI bleed [K92.2] Acute anemia [D64.9]  DISCHARGE DIAGNOSIS:  Principal Problem:   GI bleed Active Problems:   Anemia   Symptomatic anemia   SECONDARY DIAGNOSIS:   Past Medical History:  Diagnosis Date  . Depression   . Diabetes mellitus without complication ([place])   . Hip fracture [facility])    left March 2017  . Hypertension   . Neuropathy   . Osteoporosis   . Restless leg syndrome   . Stroke ([place])   . Tremor     HOSPITAL COURSE:   * Acute blood loss anemia S/p 2 units PRBC  hemoglobin stable.  EGD and colonoscopy with no significant findings.  Capsule endoscopy recommended by GI .  Repeat hb stable.  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  *Hypertension Meds held- stable without meds.  *Diabetes SSI  Wills top lantus as blood sugar was not too high , just keep on metformin.  *Hyperlipidemia Continue statin.  *Parkinson's disease Continue carbidopa levodopa.  Pt lives at home with husband and family, is bedbound and barely walks a few steps.  DISCHARGE CONDITIONS:   Stable.  CONSULTS OBTAINED:  Treatment Team:  Hillary Bow, MD  DRUG ALLERGIES:  No Known Allergies  DISCHARGE MEDICATIONS:   Allergies as of 03/16/2018   No Known Allergies     Medication List    STOP taking these medications   LANTUS SOLOSTAR 100 UNIT/ML Solostar Pen Generic drug:  Insulin Glargine   lisinopril 40 MG tablet Commonly known as:  PRINIVIL,ZESTRIL   pantoprazole 40 MG tablet Commonly known as:  PROTONIX     TAKE these medications   acetaminophen 325 MG tablet Commonly  known as:  TYLENOL Take 650 mg every 4 (four) hours as needed by mouth for mild pain.   alendronate 70 MG tablet Commonly known as:  FOSAMAX Take 70 mg by mouth every Friday.   carbidopa-levodopa 25-100 MG tablet Commonly known as:  SINEMET IR Take 2 tablets by mouth 3 (three) times daily.   donepezil 5 MG tablet Commonly known as:  ARICEPT Take 5 mg at bedtime by mouth.   DULoxetine 60 MG capsule Commonly known as:  CYMBALTA Take 60 mg at bedtime by mouth.   ferrous sulfate 325 (65 FE) MG EC tablet Take 1 tablet (325 mg total) by mouth 2 (two) times daily.   LYRICA 150 MG capsule Generic drug:  pregabalin Take 150 mg by mouth 2 (two) times daily.   metFORMIN 500 MG tablet Commonly known as:  GLUCOPHAGE Take 500 mg by mouth 3 (three) times daily.   pravastatin 40 MG tablet Commonly known as:  PRAVACHOL Take 40 mg by mouth at bedtime.   primidone 50 MG tablet Commonly known as:  MYSOLINE Take 50 mg by mouth 2 (two) times daily.   Vitamin D3 2000 units Tabs Take 2,000 Units by mouth daily.        DISCHARGE INSTRUCTIONS:    Follow with GI clinic , refer to [place] clinic.  If you experience worsening of your admission symptoms, develop shortness of breath, life threatening emergency, suicidal or homicidal thoughts you  must seek medical attention immediately by calling 911 or calling your MD immediately  if symptoms less severe.  You Must read complete instructions/literature along with all the possible adverse reactions/side effects for all the Medicines you take and that have been prescribed to you. Take any new Medicines after you have completely understood and accept all the possible adverse reactions/side effects.   Please note  You were cared for by a hospitalist during your hospital stay. If you have any questions about your discharge medications or the care you received while you were in the hospital after you are discharged, you can call the unit and asked  to speak with the hospitalist on call if the hospitalist that took care of you is not available. Once you are discharged, your primary care physician will handle any further medical issues. Please note that NO REFILLS for any discharge medications will be authorized once you are discharged, as it is imperative that you return to your primary care physician (or establish a relationship with a primary care physician if you do not have one) for your aftercare needs so that they can reassess your need for medications and monitor your lab values.    Today   CHIEF COMPLAINT:   Chief Complaint  Patient presents with  . Altered Mental Status    HISTORY OF PRESENT ILLNESS:  Carrie Bishop  is a 84 y.o. female with a known history of depression, diabetes, hip fracture, hypertension, neuropathy, stroke, tremor, Parkinson's disease-lives with her daughter and walks with a walker at home.  She also has some baseline dementia. For last few weeks she has gradual weakening and yesterday she also fell down while trying to get to the bathroom.  Because of this worsening weakness, daughter called her primary care physician and they were suspecting patient might be having a new stroke. As she continued to get worse, she decided to just bring her to the emergency room instead of waiting till tomorrow to see primary care physician. She was noted to have significant drop in her hemoglobin and stool guaiac was positive by ER physician.  So he ordered for blood transfusion given to hospitalist team for admission for GI bleed. She takes aspirin every day but denies using over-the-counter pain medication or BC powders. Her daughter has noted her stool very dark since long time.   VITAL SIGNS:  Blood pressure 116/66, pulse 100, temperature 98.3 F (36.8 C), temperature source Oral, resp. rate 18, height 5\' 6"  (1.676 m), weight 54 kg, SpO2 96 %.  I/O:    Intake/Output Summary (Last 24 hours) at 03/16/2018 1511 Last  data filed at 03/16/2018 1423 Gross per 24 hour  Intake 820 ml  Output -  Net 820 ml    PHYSICAL EXAMINATION:  GENERAL:  84 y.o.-year-old patient lying in the bed with no acute distress.  EYES: Pupils equal, round, reactive to light and accommodation. No scleral icterus. Extraocular muscles intact.  HEENT: Head atraumatic, normocephalic. Oropharynx and nasopharynx clear.  NECK:  Supple, no jugular venous distention. No thyroid enlargement, no tenderness.  LUNGS: Normal breath sounds bilaterally, no wheezing, rales,rhonchi or crepitation. No use of accessory muscles of respiration.  CARDIOVASCULAR: S1, S2 normal. No murmurs, rubs, or gallops.  ABDOMEN: Soft, non-tender, non-distended. Bowel sounds present. No organomegaly or mass.  EXTREMITIES: No pedal edema, cyanosis, or clubbing.  NEUROLOGIC: Cranial nerves II through XII are intact. Muscle strength 5/5 in all extremities. Sensation intact. Gait not checked.  PSYCHIATRIC: The patient is alert and  oriented x 3.  SKIN: No obvious rash, lesion, or ulcer.   DATA REVIEW:   CBC Recent Labs  Lab 03/16/18 1406  WBC 6.1  HGB 9.7*  HCT 28.8*  PLT 235    Chemistries  Recent Labs  Lab 03/12/18 1604 03/13/18 0519  NA 138 138  K 4.1 4.3  CL 104 108  CO2 21* 26  GLUCOSE 204* 128*  BUN 50* 39*  CREATININE 0.89 0.66  CALCIUM 9.2 8.3*  AST 28  --   ALT 9  --   ALKPHOS 74  --   BILITOT 0.5  --     Cardiac Enzymes Recent Labs  Lab 03/12/18 [place] <0.03    Microbiology Results  Results for orders placed or performed during the hospital encounter of 03/30/16  Surgical PCR screen     Status: Abnormal   Collection Time: 03/30/16  6:12 PM  Result Value Ref Range Status   MRSA, PCR NEGATIVE NEGATIVE Final   Staphylococcus aureus POSITIVE (A) NEGATIVE Final    Comment:        The Xpert SA Assay (FDA approved for NASAL specimens in patients over 64 years of age), is one component of a comprehensive  surveillance program.  Test performance has been validated by [facility] for patients greater than or equal to 10 year old. It is not intended to diagnose infection nor to guide or monitor treatment.     RADIOLOGY:  No results found.  EKG:   Orders placed or performed during the hospital encounter of 03/12/18  . EKG 12-Lead  . EKG 12-Lead  . ED EKG  . ED EKG      Management plans discussed with the patient, family and they are in agreement.  CODE STATUS:     Code Status Orders  (From admission, onward)         Start     Ordered   03/12/18 1844  Limited resuscitation (code)  Continuous    Question Answer Comment  In the event of cardiac or respiratory ARREST: Initiate Code Blue, Call Rapid Response Yes   In the event of cardiac or respiratory ARREST: Perform CPR Yes   In the event of cardiac or respiratory ARREST: Perform Intubation/Mechanical Ventilation No   In the event of cardiac or respiratory ARREST: Use NIPPV/BiPAp only if indicated Yes   In the event of cardiac or respiratory ARREST: Administer ACLS medications if indicated Yes   In the event of cardiac or respiratory ARREST: Perform Defibrillation or Cardioversion if indicated Yes      03/12/18 1843        Code Status History    Date Active Date Inactive Code Status Order ID Comments User Context   06/17/2017 0442 06/18/2017 1943 Full Code [id number]  Harrie Foreman, MD Inpatient   03/30/2016 1654 04/03/2016 1923 Full Code [id number]  Lytle Butte, MD ED   10/20/2015 2020 10/24/2015 1909 Full Code [id number]  Theodoro Grist, MD Inpatient      TOTAL TIME TAKING CARE OF THIS PATIENT: 35 minutes.    Vaughan Basta M.D on 03/16/2018 at 3:11 PM  Between 7am to 6pm - Pager - [phone]  After 6pm go to [email or web address] - password [email or web address] [facility]  Office  [phone]  CC: Primary care physician; Cyndi Bender, PA-C   Note: This dictation was prepared with Dragon  dictation along with smaller phrase technology. Any transcriptional errors that result from this process are unintentional.

## 2018-03-16 NOTE — Progress Notes (Signed)
Oral and written AVS instructions given to wife and dgt with stated understanding. Ready and agreeable with discharge home. Discharge home to self/home care. Tranported in transport chair to private vehicle.

## 2018-03-16 NOTE — Progress Notes (Signed)
   Jonathon Bellows , MD [address] [place], [place], [place], [address] [address], [place], [place], [place], [address] Phone: [phone]  Fax: [phone]   Carrie Bishop is being followed for iron deficiency anemia   Subjective: No complaints   Objective: Vital signs in last 24 hours: Vitals:   03/15/18 1345 03/15/18 2005 03/16/18 0526 03/16/18 1432  BP: 140/63 (!) 108/54 (!) 158/86 116/66  Pulse: 87 90 77 100  Resp: 20 18 18    Temp: 98.4 F (36.9 C) 98.6 F (37 C) 98.1 F (36.7 C) 98.3 F (36.8 C)  TempSrc: Oral Oral Oral Oral  SpO2: 96% 98% 98% 96%  Weight:      Height:       Weight change:   Intake/Output Summary (Last 24 hours) at 03/16/2018 1447 Last data filed at 03/16/2018 1423 Gross per 24 hour  Intake 820 ml  Output -  Net 820 ml     Exam: Heart:: Regular rate and rhythm, S1S2 present or without murmur or extra heart sounds Lungs: normal, clear to auscultation and clear to auscultation and percussion Abdomen: soft, nontender, normal bowel sounds   Lab Results: @LABTEST2 @ Micro Results: No results found for this or any previous visit (from the past 240 hour(s)). Studies/Results: No results found. Medications: I have reviewed the patient's current medications. Scheduled Meds: . carbidopa-levodopa  2 tablet Oral TID  . cholecalciferol  2,000 Units Oral Daily  . donepezil  5 mg Oral QHS  . DULoxetine  60 mg Oral QHS  . feeding supplement  1 Container Oral TID BM  . insulin aspart  0-9 Units Subcutaneous TID WC  . magnesium citrate  1 Bottle Oral Once  . pravastatin  40 mg Oral QHS  . pregabalin  150 mg Oral BID  . primidone  50 mg Oral BID  . sodium chloride flush  3 mL Intravenous Q12H   Continuous Infusions: PRN Meds:.acetaminophen, docusate sodium CBC Latest Ref Rng & Units 03/16/2018 03/15/2018 03/14/2018  WBC 3.6 - 11.0 K/uL 6.1 - -  Hemoglobin 12.0 - 16.0 g/dL 9.7(L) 8.6(L) 9.3(L)  Hematocrit 35.0 - 47.0 % 28.8(L) - -    Platelets 150 - 440 K/uL 235 - -     Assessment: Principal Problem:   GI bleed Active Problems:   Anemia   Symptomatic anemia  Carrie Bishop 84 y.o. female here with iron deficiency anemia. I just read the capsule study of the small bowel and she has multiple non bleeding AVM's of the small bowel which could be the cause of the iron deficiency . It can be discussed as an outpatient by Dr Marius Ditch in terms of treatment options in her clinic. Since Hb stable can go home from GI point of view. IV iron with hematology as an outpatient after inpatient dose if not had already.   I will sign off.  Please call me if any further GI concerns or questions.  We would like to thank you for the opportunity to participate in the care of Carrie Bishop.    LOS: 4 days   Jonathon Bellows, MD 03/16/2018, 2:47 PM

## 2018-03-17 ENCOUNTER — Encounter: Payer: Self-pay | Admitting: Gastroenterology

## 2018-03-18 ENCOUNTER — Encounter: Payer: Self-pay | Admitting: Gastroenterology

## 2018-03-19 LAB — SURGICAL PATHOLOGY

## 2018-03-24 DIAGNOSIS — D500 Iron deficiency anemia secondary to blood loss (chronic): Secondary | ICD-10-CM | POA: Diagnosis not present

## 2018-03-24 DIAGNOSIS — Z79899 Other long term (current) drug therapy: Secondary | ICD-10-CM | POA: Diagnosis not present

## 2018-03-24 DIAGNOSIS — I10 Essential (primary) hypertension: Secondary | ICD-10-CM | POA: Diagnosis not present

## 2018-03-24 DIAGNOSIS — E119 Type 2 diabetes mellitus without complications: Secondary | ICD-10-CM | POA: Diagnosis not present

## 2018-04-01 ENCOUNTER — Other Ambulatory Visit: Payer: Self-pay

## 2018-04-01 ENCOUNTER — Ambulatory Visit: Payer: Medicare HMO | Admitting: Gastroenterology

## 2018-04-01 ENCOUNTER — Encounter: Payer: Self-pay | Admitting: Gastroenterology

## 2018-04-01 ENCOUNTER — Other Ambulatory Visit
Admission: RE | Admit: 2018-04-01 | Discharge: 2018-04-01 | Disposition: A | Discharge status: Home / Self Care | Payer: Medicare HMO | Source: Ambulatory Visit | Attending: Gastroenterology | Admitting: Gastroenterology

## 2018-04-01 VITALS — BP 98/60 | HR 116 | Resp 18 | Wt 121.0 lb

## 2018-04-01 DIAGNOSIS — D500 Iron deficiency anemia secondary to blood loss (chronic): Secondary | ICD-10-CM

## 2018-04-01 DIAGNOSIS — D509 Iron deficiency anemia, unspecified: Secondary | ICD-10-CM | POA: Diagnosis not present

## 2018-04-01 DIAGNOSIS — K5520 Angiodysplasia of colon without hemorrhage: Secondary | ICD-10-CM | POA: Diagnosis not present

## 2018-04-01 DIAGNOSIS — E119 Type 2 diabetes mellitus without complications: Secondary | ICD-10-CM | POA: Diagnosis not present

## 2018-04-01 DIAGNOSIS — M810 Age-related osteoporosis without current pathological fracture: Secondary | ICD-10-CM | POA: Insufficient documentation

## 2018-04-01 DIAGNOSIS — I10 Essential (primary) hypertension: Secondary | ICD-10-CM | POA: Insufficient documentation

## 2018-04-01 DIAGNOSIS — E1121 Type 2 diabetes mellitus with diabetic nephropathy: Secondary | ICD-10-CM | POA: Diagnosis not present

## 2018-04-01 DIAGNOSIS — Z8673 Personal history of transient ischemic attack (TIA), and cerebral infarction without residual deficits: Secondary | ICD-10-CM | POA: Insufficient documentation

## 2018-04-01 DIAGNOSIS — F329 Major depressive disorder, single episode, unspecified: Secondary | ICD-10-CM | POA: Insufficient documentation

## 2018-04-01 LAB — CBC
HEMATOCRIT: 36.5 % (ref 35.0–47.0)
HEMOGLOBIN: 12.1 g/dL (ref 12.0–16.0)
MCH: 29.1 pg (ref 26.0–34.0)
MCHC: 33 g/dL (ref 32.0–36.0)
MCV: 88.2 fL (ref 80.0–100.0)
Platelets: 307 10*3/uL (ref 150–440)
RBC: 4.14 MIL/uL (ref 3.80–5.20)
RDW: 24.8 % — AB (ref 11.5–14.5)
WBC: 6.4 10*3/uL (ref 3.6–11.0)

## 2018-04-01 LAB — FOLATE: FOLATE: 31 ng/mL (ref >5.9)

## 2018-04-01 LAB — IRON AND TIBC
Iron: 93 ug/dL (ref 28–170)
SATURATION RATIOS: 30 % (ref 10.4–31.8)
TIBC: 309 ug/dL (ref 250–450)
UIBC: 216 ug/dL

## 2018-04-01 LAB — FERRITIN: Ferritin: 120 ng/mL (ref 11–307)

## 2018-04-01 LAB — HEMOGLOBIN A1C
Hgb A1c MFr Bld: 5.6 % (ref 4.8–5.6)
Mean Plasma Glucose: 114.02 mg/dL

## 2018-04-01 LAB — VITAMIN B12: VITAMIN B 12: 295 pg/mL (ref 180–914)

## 2018-04-01 NOTE — Progress Notes (Signed)
Cephas Darby, MD [address]  [place]  [place], [place] [address]  Main: [phone]  Fax: [phone]    Gastroenterology Consultation  Referring Provider:     Cyndi Bender, PA-C Primary Care Physician:  Cyndi Bender, PA-C Primary Gastroenterologist:  Dr. Cephas Darby Reason for Consultation:   iron deficiency anemia        HPI:   Carrie Bishop is a 84 y.o. female referred by Dr. Cyndi Bender, PA-C  for consultation & management of iron deficiency anemia. Patient was recently admitted to [facility] secondary to severe symptomatic anemia with no evidence of active GI bleed. Has a hemoglobin on presentation  In 02/2018 was 6.8, it was 13.8 in 05/2017. She underwent EGD and colonoscopy which were essentially unremarkable. Subsequently, she underwent video capsule endoscopy as an inpatient revealed mid and distal small bowel nonbleeding AVMs. She was discharged home on oral iron therapy.   Interval summary She is accompanied by her daughter and granddaughter to the office today. She has been doing well since discharge. According to the daughter, patient has been having black-colored stools and she is on oral iron twice daily.  NSAIDs: none  Antiplts/Anticoagulants/Anti thrombotics: none  GI Procedures:  EGD 03/14/2018 - Normal duodenal bulb, second portion of the duodenum and third portion of the duodenum. Biopsied. - 2 cm hiatal hernia. - Normal stomach, Biopsied. - Normal gastroesophageal junction and esophagus.  DIAGNOSIS:  A. DUODENUM; COLD BIOPSY:  - DUODENAL MUCOSA WITH INTACT VILLI.  - NEGATIVE FOR ACTIVE INFLAMMATION, INTRAEPITHELIAL LYMPHOCYTOSIS, AND  INFECTIOUS AGENTS.   B. STOMACH; RANDOM COLD BIOPSY:  - MILD CHRONIC INACTIVE GASTRITIS AND FOVEOLAR HYPERPLASIA.  - FOCAL INTESTINAL METAPLASIA INVOLVING OXYNTIC MUCOSA, WITHOUT DEFINITE  ATROPHY.  - NEGATIVE FOR H. PYLORI, DYSPLASIA, AND MALIGNANCY.    Colonoscopy  03/14/2018 - Preparation of the colon was poor. - Hemorrhoids found on perianal exam. - Stool in the entire examined colon. - External and internal hemorrhoids. - No specimens collected.  VCE 03/17/2018 Study is complete, mid and distal small bowel AVMs, nonbleeding   Past Medical History:  Diagnosis Date  . Depression   . Diabetes mellitus without complication ([place])   . Hip fracture [facility])    left March 2017  . Hypertension   . Neuropathy   . Osteoporosis   . Restless leg syndrome   . Stroke ([place])   . Tremor     Past Surgical History:  Procedure Laterality Date  . ABDOMINAL HYSTERECTOMY    . arm surgery    . COLONOSCOPY WITH PROPOFOL N/A 03/14/2018   Procedure: COLONOSCOPY WITH PROPOFOL;  Surgeon: Lin Landsman, MD;  Location: [facility] ENDOSCOPY;  Service: Gastroenterology;  Laterality: N/A;  . ESOPHAGOGASTRODUODENOSCOPY (EGD) WITH PROPOFOL N/A 03/14/2018   Procedure: ESOPHAGOGASTRODUODENOSCOPY (EGD) WITH PROPOFOL;  Surgeon: Lin Landsman, MD;  Location: [facility] ENDOSCOPY;  Service: Gastroenterology;  Laterality: N/A;  . GIVENS CAPSULE STUDY N/A 03/15/2018   Procedure: GIVENS CAPSULE STUDY;  Surgeon: Jonathon Bellows, MD;  Location: [facility] ENDOSCOPY;  Service: Gastroenterology;  Laterality: N/A;  . INTRAMEDULLARY (IM) NAIL INTERTROCHANTERIC Left 10/21/2015   Procedure: INTRAMEDULLARY (IM) NAIL INTERTROCHANTRIC LEFT LONG AFFIXUS;  Surgeon: Hessie Knows, MD;  Location: [facility] ORS;  Service: Orthopedics;  Laterality: Left;  . INTRAMEDULLARY (IM) NAIL INTERTROCHANTERIC Right 03/31/2016   Procedure: INTRAMEDULLARY (IM) NAIL INTERTROCHANTRIC;  Surgeon: Dereck Leep, MD;  Location: [facility] ORS;  Service: Orthopedics;  Laterality: Right;  . LEG SURGERY      Prior  to Admission medications   Medication Sig Start Date End Date Taking? Authorizing Provider  acetaminophen (TYLENOL) 325 MG tablet Take 650 mg every 4 (four) hours as needed by mouth for mild pain.   Yes [provider]    alendronate (FOSAMAX) 70 MG tablet Take 70 mg by mouth every Friday.    Yes [provider]  carbidopa-levodopa (SINEMET IR) 25-100 MG tablet Take 2 tablets by mouth 3 (three) times daily.    Yes [provider]  Cholecalciferol (VITAMIN D3) 2000 units TABS Take 2,000 Units by mouth daily.   Yes [provider]  donepezil (ARICEPT) 5 MG tablet Take 5 mg at bedtime by mouth. 06/03/17  Yes [provider]  DULoxetine (CYMBALTA) 60 MG capsule Take 60 mg at bedtime by mouth.    Yes [provider]  ferrous sulfate 325 (65 FE) MG EC tablet Take 1 tablet (325 mg total) by mouth 2 (two) times daily. 03/16/18 03/16/19 Yes Vaughan Basta, MD  lisinopril (PRINIVIL,ZESTRIL) 40 MG tablet  03/18/18  Yes [provider]  metFORMIN (GLUCOPHAGE) 500 MG tablet Take 500 mg by mouth 3 (three) times daily.   Yes [provider]  pantoprazole (PROTONIX) 40 MG tablet  03/18/18  Yes [provider]  pravastatin (PRAVACHOL) 40 MG tablet Take 40 mg by mouth at bedtime.    Yes [provider]  pregabalin (LYRICA) 150 MG capsule Take 150 mg by mouth 2 (two) times daily.   Yes [provider]  primidone (MYSOLINE) 50 MG tablet Take 50 mg by mouth 2 (two) times daily.    Yes [provider]    Family History  Problem Relation Age of Onset  . Hypertension Other      Social History   Tobacco Use  . Smoking status: Never Smoker  . Smokeless tobacco: Never Used  Substance Use Topics  . Alcohol use: No  . Drug use: No    Allergies as of 04/01/2018  . (No Known Allergies)    Review of Systems:    All systems reviewed and negative except where noted in HPI.   Physical Exam:  BP 98/60 (BP Location: Left Arm, Patient Position: Sitting, Cuff Size: Normal)   Pulse (!) 116   Resp 18   Wt 121 lb (54.9 kg)   BMI 19.53 kg/m  No LMP recorded. Patient has had a hysterectomy.  General:   Alert,  Well-developed,  well-nourished, pleasant and cooperative in NAD Head:  Normocephalic and atraumatic. Eyes:  Sclera clear, no icterus.   Conjunctiva pink. Ears:  Normal auditory acuity. Nose:  No deformity, discharge, or lesions. Mouth:  No deformity or lesions,oropharynx pink & moist. Neck:  Supple; no masses or thyromegaly. Lungs:  Respirations even and unlabored.  Clear throughout to auscultation.   No wheezes, crackles, or rhonchi. No acute distress. Heart:  Regular rate and rhythm; no murmurs, clicks, rubs, or gallops. Abdomen:  Normal bowel sounds. Soft, non-tender and non-distended without masses, hepatosplenomegaly or hernias noted.  No guarding or rebound tenderness.   Rectal: Not performed Msk:  Symmetrical without gross deformities. Good, equal movement & strength bilaterally. Pulses:  Normal pulses noted. Extremities:  No clubbing or edema.  No cyanosis. Neurologic:  Alert and oriented x2;  grossly normal neurologically. Skin:  Intact without significant lesions or rashes. No jaundice. Lymph Nodes:  No significant cervical adenopathy. Psych:  Alert and cooperative. Normal mood and affect.  Imaging Studies: No abdominal imaging  Assessment and Plan:  Carrie Bishop is a 84 y.o. Caucasian female with history of dementia, Parkinson's with chronic iron deficiency anemia, with no evidence of active GI bleed, recent admission to [facility] in 02/2018 with acute on chronic severe symptomatic iron deficiency anemia. She. underwent endoscopic evaluation including EGD and colonoscopy unrevealing, small bowel capsule endoscopy revealed nonbleeding distal and mid small bowel AVMs.  Patient is currently on oral iron and having dark stools, clinically, she does not appear anemic. Recommend to recheck CBC, iron studies. If she is not responding to iron replacement therapy, recommend deep enteroscopy. [place]  will manage conservatively as long as she is not actively  bleeding   Follow up in 3 months or sooner   Cephas Darby, MD

## 2018-04-10 DIAGNOSIS — E785 Hyperlipidemia, unspecified: Secondary | ICD-10-CM | POA: Diagnosis not present

## 2018-04-10 DIAGNOSIS — Z139 Encounter for screening, unspecified: Secondary | ICD-10-CM | POA: Diagnosis not present

## 2018-04-10 DIAGNOSIS — Z1331 Encounter for screening for depression: Secondary | ICD-10-CM | POA: Diagnosis not present

## 2018-04-10 DIAGNOSIS — Z136 Encounter for screening for cardiovascular disorders: Secondary | ICD-10-CM | POA: Diagnosis not present

## 2018-04-10 DIAGNOSIS — Z0000 Encounter for general adult medical examination without abnormal findings: Secondary | ICD-10-CM | POA: Diagnosis not present

## 2018-04-10 DIAGNOSIS — Z9181 History of falling: Secondary | ICD-10-CM | POA: Diagnosis not present

## 2018-04-10 DIAGNOSIS — Z23 Encounter for immunization: Secondary | ICD-10-CM | POA: Diagnosis not present

## 2018-04-23 ENCOUNTER — Ambulatory Visit: Payer: Self-pay | Admitting: Gastroenterology

## 2018-05-13 ENCOUNTER — Encounter: Payer: Self-pay | Admitting: Gastroenterology

## 2018-05-13 ENCOUNTER — Other Ambulatory Visit: Payer: Self-pay

## 2018-05-13 ENCOUNTER — Ambulatory Visit: Payer: Medicare HMO | Admitting: Gastroenterology

## 2018-05-13 VITALS — BP 102/65 | HR 94 | Resp 17 | Wt 121.0 lb

## 2018-05-13 DIAGNOSIS — D500 Iron deficiency anemia secondary to blood loss (chronic): Secondary | ICD-10-CM

## 2018-05-13 NOTE — Progress Notes (Signed)
Cephas Darby, MD [address]  [place]  [place], [place] [address]  Main: [phone]  Fax: [phone]    Gastroenterology Consultation  Referring Provider:     Cyndi Bender, PA-C Primary Care Physician:  Cyndi Bender, PA-C Primary Gastroenterologist:  Dr. Cephas Darby Reason for Consultation:   iron deficiency anemia        HPI:   Carrie Bishop is a 84 y.o. female referred by Dr. Cyndi Bender, PA-C  for consultation & management of iron deficiency anemia. Patient was recently admitted to [facility] secondary to severe symptomatic anemia with no evidence of active GI bleed. Has a hemoglobin on presentation  In 02/2018 was 6.8, it was 13.8 in 05/2017. She underwent EGD and colonoscopy which were essentially unremarkable. Subsequently, she underwent video capsule endoscopy as an inpatient revealed mid and distal small bowel nonbleeding AVMs. She was discharged home on oral iron therapy.   Interval summary She is accompanied by her daughter and granddaughter to the office today. She has been doing well since discharge. According to the daughter, patient has been having black-colored stools and she is on oral iron twice daily.  Follow up visit 05/13/18: Anemia resolved, on oral iron one daily. Stools are dark formed to loose Daughter worried if she is losing blood  NSAIDs: none  Antiplts/Anticoagulants/Anti thrombotics: none  GI Procedures:  EGD 03/14/2018 - Normal duodenal bulb, second portion of the duodenum and third portion of the duodenum. Biopsied. - 2 cm hiatal hernia. - Normal stomach, Biopsied. - Normal gastroesophageal junction and esophagus.  DIAGNOSIS:  A. DUODENUM; COLD BIOPSY:  - DUODENAL MUCOSA WITH INTACT VILLI.  - NEGATIVE FOR ACTIVE INFLAMMATION, INTRAEPITHELIAL LYMPHOCYTOSIS, AND  INFECTIOUS AGENTS.   B. STOMACH; RANDOM COLD BIOPSY:  - MILD CHRONIC INACTIVE GASTRITIS AND FOVEOLAR HYPERPLASIA.  - FOCAL  INTESTINAL METAPLASIA INVOLVING OXYNTIC MUCOSA, WITHOUT DEFINITE  ATROPHY.  - NEGATIVE FOR H. PYLORI, DYSPLASIA, AND MALIGNANCY.    Colonoscopy 03/14/2018 - Preparation of the colon was poor. - Hemorrhoids found on perianal exam. - Stool in the entire examined colon. - External and internal hemorrhoids. - No specimens collected.  VCE 03/17/2018 Study is complete, mid and distal small bowel AVMs, nonbleeding   Past Medical History:  Diagnosis Date  . Depression   . Diabetes mellitus without complication ([place])   . Hip fracture [facility])    left March 2017  . Hypertension   . Neuropathy   . Osteoporosis   . Restless leg syndrome   . Stroke ([place])   . Tremor     Past Surgical History:  Procedure Laterality Date  . ABDOMINAL HYSTERECTOMY    . arm surgery    . COLONOSCOPY WITH PROPOFOL N/A 03/14/2018   Procedure: COLONOSCOPY WITH PROPOFOL;  Surgeon: Lin Landsman, MD;  Location: [facility] ENDOSCOPY;  Service: Gastroenterology;  Laterality: N/A;  . ESOPHAGOGASTRODUODENOSCOPY (EGD) WITH PROPOFOL N/A 03/14/2018   Procedure: ESOPHAGOGASTRODUODENOSCOPY (EGD) WITH PROPOFOL;  Surgeon: Lin Landsman, MD;  Location: [facility] ENDOSCOPY;  Service: Gastroenterology;  Laterality: N/A;  . GIVENS CAPSULE STUDY N/A 03/15/2018   Procedure: GIVENS CAPSULE STUDY;  Surgeon: Jonathon Bellows, MD;  Location: [facility] ENDOSCOPY;  Service: Gastroenterology;  Laterality: N/A;  . INTRAMEDULLARY (IM) NAIL INTERTROCHANTERIC Left 10/21/2015   Procedure: INTRAMEDULLARY (IM) NAIL INTERTROCHANTRIC LEFT LONG AFFIXUS;  Surgeon: Hessie Knows, MD;  Location: [facility] ORS;  Service: Orthopedics;  Laterality: Left;  . INTRAMEDULLARY (IM) NAIL INTERTROCHANTERIC Right 03/31/2016   Procedure: INTRAMEDULLARY (IM) NAIL INTERTROCHANTRIC;  Surgeon: Dereck Leep, MD;  Location: [facility] ORS;  Service: Orthopedics;  Laterality: Right;  . LEG SURGERY      Current Outpatient Medications:  .  acetaminophen (TYLENOL) 325 MG tablet, Take 650 mg  every 4 (four) hours as needed by mouth for mild pain., Disp: , Rfl:  .  alendronate (FOSAMAX) 70 MG tablet, Take 70 mg by mouth every Friday. , Disp: , Rfl:  .  carbidopa-levodopa (SINEMET IR) 25-100 MG tablet, Take 2 tablets by mouth 3 (three) times daily. , Disp: , Rfl:  .  Cholecalciferol (VITAMIN D3) 2000 units TABS, Take 2,000 Units by mouth daily., Disp: , Rfl:  .  donepezil (ARICEPT) 5 MG tablet, Take 5 mg at bedtime by mouth., Disp: , Rfl:  .  DULoxetine (CYMBALTA) 60 MG capsule, Take 60 mg at bedtime by mouth. , Disp: , Rfl:  .  ferrous sulfate 325 (65 FE) MG EC tablet, Take 1 tablet (325 mg total) by mouth 2 (two) times daily., Disp: 60 tablet, Rfl: 3 .  LANTUS SOLOSTAR 100 UNIT/ML Solostar Pen, INJECT 5 TO 10 UNITS AT BEDTIME ( SLOWLY INCREASE UNTIL FASTING GLUCOSE <130, Disp: , Rfl: 6 .  lisinopril (PRINIVIL,ZESTRIL) 40 MG tablet, , Disp: , Rfl:  .  metFORMIN (GLUCOPHAGE) 500 MG tablet, Take 500 mg by mouth 3 (three) times daily., Disp: , Rfl:  .  pantoprazole (PROTONIX) 40 MG tablet, , Disp: , Rfl:  .  pravastatin (PRAVACHOL) 40 MG tablet, Take 40 mg by mouth at bedtime. , Disp: , Rfl:  .  pregabalin (LYRICA) 150 MG capsule, Take 150 mg by mouth 2 (two) times daily., Disp: , Rfl:  .  primidone (MYSOLINE) 50 MG tablet, Take 50 mg by mouth 2 (two) times daily. , Disp: , Rfl:     Family History  Problem Relation Age of Onset  . Hypertension Other      Social History   Tobacco Use  . Smoking status: Never Smoker  . Smokeless tobacco: Never Used  Substance Use Topics  . Alcohol use: No  . Drug use: No    Allergies as of 05/13/2018  . (No Known Allergies)    Review of Systems:    All systems reviewed and negative except where noted in HPI.   Physical Exam:  BP 102/65 (BP Location: Left Arm, Patient Position: Sitting, Cuff Size: Normal)   Pulse 94   Resp 17   Wt 121 lb (54.9 kg)   BMI 19.53 kg/m  No LMP recorded. Patient has had a hysterectomy.  General:    Alert, frail, pleasant and cooperative in NAD Head:  Normocephalic and atraumatic. Eyes:  Sclera clear, no icterus.   Conjunctiva pink. Ears:  Normal auditory acuity. Nose:  No deformity, discharge, or lesions. Mouth:  No deformity or lesions,oropharynx pink & moist. Neck:  Supple; no masses or thyromegaly. Lungs:  Respirations even and unlabored.  Clear throughout to auscultation.   No wheezes, crackles, or rhonchi. No acute distress. Heart:  Regular rate and rhythm; no murmurs, clicks, rubs, or gallops. Abdomen:  Normal bowel sounds. Soft, non-tender and non-distended without masses, hepatosplenomegaly or hernias noted.  No guarding or rebound tenderness.   Rectal: Not performed Msk:  Symmetrical without gross deformities. Good, equal movement & strength bilaterally. Pulses:  Normal pulses noted. Extremities:  No clubbing or edema.  No cyanosis. Neurologic:  Alert and oriented x2;  grossly normal neurologically. Skin:  Intact without significant lesions or rashes. No jaundice. Lymph Nodes:  No  significant cervical adenopathy. Psych:  Alert and cooperative. Normal mood and affect.  Imaging Studies: No abdominal imaging  Assessment and Plan:   YOSHI VICENCIO is a 84 y.o. Caucasian female with history of dementia, Parkinson's with chronic iron deficiency anemia, with no evidence of active GI bleed, admission to [facility] in 02/2018 with acute on chronic severe symptomatic iron deficiency anemia. She underwent endoscopic evaluation including EGD and colonoscopy unrevealing, small bowel capsule endoscopy revealed nonbleeding distal and mid small bowel AVMs.  Patient is currently on oral iron and having dark stools, clinically, she does not appear anemic. Recommend to recheck CBC, iron studies. If she is not responding to iron replacement therapy, recommend deep enteroscopy. Carrie Bishop  will manage conservatively as long as she is not actively bleeding   Follow up in  3 months or sooner   Cephas Darby, MD

## 2018-05-14 LAB — CBC
Hematocrit: 39.4 % (ref 34.0–46.6)
Hemoglobin: 12.9 g/dL (ref 11.1–15.9)
MCH: 29.1 pg (ref 26.6–33.0)
MCHC: 32.7 g/dL (ref 31.5–35.7)
MCV: 89 fL (ref 79–97)
PLATELETS: 233 10*3/uL (ref 150–450)
RBC: 4.44 x10E6/uL (ref 3.77–5.28)
RDW: 16.3 % — AB (ref 12.3–15.4)
WBC: 7.1 10*3/uL (ref 3.4–10.8)

## 2018-05-14 LAB — FERRITIN: FERRITIN: 33 ng/mL (ref 15–150)

## 2018-05-15 DIAGNOSIS — E7800 Pure hypercholesterolemia, unspecified: Secondary | ICD-10-CM | POA: Diagnosis not present

## 2018-05-15 DIAGNOSIS — M79604 Pain in right leg: Secondary | ICD-10-CM | POA: Diagnosis not present

## 2018-05-15 DIAGNOSIS — F329 Major depressive disorder, single episode, unspecified: Secondary | ICD-10-CM | POA: Diagnosis not present

## 2018-05-15 DIAGNOSIS — D509 Iron deficiency anemia, unspecified: Secondary | ICD-10-CM | POA: Diagnosis not present

## 2018-05-15 DIAGNOSIS — I10 Essential (primary) hypertension: Secondary | ICD-10-CM | POA: Diagnosis not present

## 2018-05-15 DIAGNOSIS — E1140 Type 2 diabetes mellitus with diabetic neuropathy, unspecified: Secondary | ICD-10-CM | POA: Diagnosis not present

## 2018-05-15 DIAGNOSIS — Z23 Encounter for immunization: Secondary | ICD-10-CM | POA: Diagnosis not present

## 2018-05-15 DIAGNOSIS — G250 Essential tremor: Secondary | ICD-10-CM | POA: Diagnosis not present

## 2018-05-15 DIAGNOSIS — I69359 Hemiplegia and hemiparesis following cerebral infarction affecting unspecified side: Secondary | ICD-10-CM | POA: Diagnosis not present

## 2018-05-27 DIAGNOSIS — G309 Alzheimer's disease, unspecified: Secondary | ICD-10-CM | POA: Diagnosis not present

## 2018-05-27 DIAGNOSIS — F0150 Vascular dementia without behavioral disturbance: Secondary | ICD-10-CM | POA: Diagnosis not present

## 2018-05-27 DIAGNOSIS — G250 Essential tremor: Secondary | ICD-10-CM | POA: Diagnosis not present

## 2018-05-27 DIAGNOSIS — F0280 Dementia in other diseases classified elsewhere without behavioral disturbance: Secondary | ICD-10-CM | POA: Diagnosis not present

## 2018-08-13 DIAGNOSIS — D509 Iron deficiency anemia, unspecified: Secondary | ICD-10-CM | POA: Diagnosis not present

## 2018-08-13 DIAGNOSIS — M79604 Pain in right leg: Secondary | ICD-10-CM | POA: Diagnosis not present

## 2018-08-13 DIAGNOSIS — G250 Essential tremor: Secondary | ICD-10-CM | POA: Diagnosis not present

## 2018-08-13 DIAGNOSIS — E119 Type 2 diabetes mellitus without complications: Secondary | ICD-10-CM | POA: Diagnosis not present

## 2018-08-13 DIAGNOSIS — F329 Major depressive disorder, single episode, unspecified: Secondary | ICD-10-CM | POA: Diagnosis not present

## 2018-08-13 DIAGNOSIS — E7800 Pure hypercholesterolemia, unspecified: Secondary | ICD-10-CM | POA: Diagnosis not present

## 2018-08-13 DIAGNOSIS — I10 Essential (primary) hypertension: Secondary | ICD-10-CM | POA: Diagnosis not present

## 2018-08-13 DIAGNOSIS — I69359 Hemiplegia and hemiparesis following cerebral infarction affecting unspecified side: Secondary | ICD-10-CM | POA: Diagnosis not present

## 2018-08-13 DIAGNOSIS — Z6822 Body mass index (BMI) 22.0-22.9, adult: Secondary | ICD-10-CM | POA: Diagnosis not present

## 2018-08-26 ENCOUNTER — Encounter: Payer: Self-pay | Admitting: Gastroenterology

## 2018-08-26 ENCOUNTER — Ambulatory Visit: Payer: Medicare HMO | Admitting: Gastroenterology

## 2018-08-26 ENCOUNTER — Other Ambulatory Visit: Payer: Self-pay

## 2018-08-26 VITALS — BP 116/67 | HR 101 | Resp 17 | Wt 129.0 lb

## 2018-08-26 DIAGNOSIS — G20 Parkinson's disease: Secondary | ICD-10-CM | POA: Diagnosis not present

## 2018-08-26 DIAGNOSIS — D500 Iron deficiency anemia secondary to blood loss (chronic): Secondary | ICD-10-CM | POA: Diagnosis not present

## 2018-08-26 DIAGNOSIS — K5520 Angiodysplasia of colon without hemorrhage: Secondary | ICD-10-CM | POA: Diagnosis not present

## 2018-08-26 NOTE — Progress Notes (Signed)
Cephas Darby, MD [address]  [place]  [place], [place] [address]  Main: [phone]  Fax: [phone]    Gastroenterology Consultation  Referring Provider:     Cyndi Bender, PA-C Primary Care Physician:  Cyndi Bender, PA-C Primary Gastroenterologist:  Dr. Cephas Darby Reason for Consultation:   iron deficiency anemia        HPI:   Carrie Bishop is a 85 y.o. female referred by Dr. Cyndi Bender, PA-C  for consultation & management of iron deficiency anemia. Patient was recently admitted to [facility] secondary to severe symptomatic anemia with no evidence of active GI bleed. Has a hemoglobin on presentation  In 02/2018 was 6.8, it was 13.8 in 05/2017. She underwent EGD and colonoscopy which were essentially unremarkable. Subsequently, she underwent video capsule endoscopy as an inpatient revealed mid and distal small bowel nonbleeding AVMs. She was discharged home on oral iron therapy.   Interval summary She is accompanied by her daughter and granddaughter to the office today. She has been doing well since discharge. According to the daughter, patient has been having black-colored stools and she is on oral iron twice daily.  Follow up visit 05/13/18: Anemia resolved, on oral iron one daily. Stools are dark formed to loose Daughter worried if she is losing blood  Follow-up visit 08/27/2018 Patient stopped oral iron.  Her stools are brown She has gained few pounds.  Patient is accompanied by her daughter today.  Patient is in good spirits  NSAIDs: none  Antiplts/Anticoagulants/Anti thrombotics: none  GI Procedures:  EGD 03/14/2018 - Normal duodenal bulb, second portion of the duodenum and third portion of the duodenum. Biopsied. - 2 cm hiatal hernia. - Normal stomach, Biopsied. - Normal gastroesophageal junction and esophagus.  DIAGNOSIS:  A. DUODENUM; COLD BIOPSY:  - DUODENAL MUCOSA WITH INTACT VILLI.  - NEGATIVE FOR ACTIVE  INFLAMMATION, INTRAEPITHELIAL LYMPHOCYTOSIS, AND  INFECTIOUS AGENTS.   B. STOMACH; RANDOM COLD BIOPSY:  - MILD CHRONIC INACTIVE GASTRITIS AND FOVEOLAR HYPERPLASIA.  - FOCAL INTESTINAL METAPLASIA INVOLVING OXYNTIC MUCOSA, WITHOUT DEFINITE  ATROPHY.  - NEGATIVE FOR H. PYLORI, DYSPLASIA, AND MALIGNANCY.    Colonoscopy 03/14/2018 - Preparation of the colon was poor. - Hemorrhoids found on perianal exam. - Stool in the entire examined colon. - External and internal hemorrhoids. - No specimens collected.  VCE 03/17/2018 Study is complete, mid and distal small bowel AVMs, nonbleeding   Past Medical History:  Diagnosis Date  . Acute posthemorrhagic anemia 04/03/2016  . Anemia 10/20/2015  . Chest pain 06/17/2017  . Depression   . Diabetes mellitus without complication ([place])   . GI bleed 03/12/2018  . Hip fracture [facility])    left March 2017  . Hypertension   . Neuropathy   . Osteoporosis   . Restless leg syndrome   . Stroke ([place])   . Symptomatic anemia 03/12/2018  . Tremor     Past Surgical History:  Procedure Laterality Date  . ABDOMINAL HYSTERECTOMY    . arm surgery    . COLONOSCOPY WITH PROPOFOL N/A 03/14/2018   Procedure: COLONOSCOPY WITH PROPOFOL;  Surgeon: Lin Landsman, MD;  Location: [facility] ENDOSCOPY;  Service: Gastroenterology;  Laterality: N/A;  . ESOPHAGOGASTRODUODENOSCOPY (EGD) WITH PROPOFOL N/A 03/14/2018   Procedure: ESOPHAGOGASTRODUODENOSCOPY (EGD) WITH PROPOFOL;  Surgeon: Lin Landsman, MD;  Location: [facility] ENDOSCOPY;  Service: Gastroenterology;  Laterality: N/A;  . GIVENS CAPSULE STUDY N/A 03/15/2018   Procedure: GIVENS CAPSULE STUDY;  Surgeon: Jonathon Bellows, MD;  Location:  [facility] ENDOSCOPY;  Service: Gastroenterology;  Laterality: N/A;  . INTRAMEDULLARY (IM) NAIL INTERTROCHANTERIC Left 10/21/2015   Procedure: INTRAMEDULLARY (IM) NAIL INTERTROCHANTRIC LEFT LONG AFFIXUS;  Surgeon: Hessie Knows, MD;  Location: [facility] ORS;  Service: Orthopedics;  Laterality: Left;  .  INTRAMEDULLARY (IM) NAIL INTERTROCHANTERIC Right 03/31/2016   Procedure: INTRAMEDULLARY (IM) NAIL INTERTROCHANTRIC;  Surgeon: Dereck Leep, MD;  Location: [facility] ORS;  Service: Orthopedics;  Laterality: Right;  . LEG SURGERY      Current Outpatient Medications:  .  acetaminophen (TYLENOL) 325 MG tablet, Take 650 mg every 4 (four) hours as needed by mouth for mild pain., Disp: , Rfl:  .  alendronate (FOSAMAX) 70 MG tablet, Take 70 mg by mouth every Friday. , Disp: , Rfl:  .  carbidopa-levodopa (SINEMET IR) 25-100 MG tablet, Take 2 tablets by mouth 3 (three) times daily. , Disp: , Rfl:  .  Cholecalciferol (VITAMIN D3) 2000 units TABS, Take 2,000 Units by mouth daily., Disp: , Rfl:  .  donepezil (ARICEPT) 5 MG tablet, Take 5 mg at bedtime by mouth., Disp: , Rfl:  .  lisinopril (PRINIVIL,ZESTRIL) 40 MG tablet, , Disp: , Rfl:  .  metFORMIN (GLUCOPHAGE) 500 MG tablet, Take 500 mg by mouth 3 (three) times daily., Disp: , Rfl:  .  pantoprazole (PROTONIX) 40 MG tablet, , Disp: , Rfl:  .  pravastatin (PRAVACHOL) 40 MG tablet, Take 40 mg by mouth at bedtime. , Disp: , Rfl:  .  pregabalin (LYRICA) 150 MG capsule, Take 150 mg by mouth 2 (two) times daily., Disp: , Rfl:  .  primidone (MYSOLINE) 50 MG tablet, Take 50 mg by mouth 2 (two) times daily. , Disp: , Rfl:  .  DULoxetine (CYMBALTA) 60 MG capsule, Take 60 mg at bedtime by mouth. , Disp: , Rfl:  .  escitalopram (LEXAPRO) 10 MG tablet, , Disp: , Rfl:  .  ferrous sulfate 325 (65 FE) MG EC tablet, Take 1 tablet (325 mg total) by mouth 2 (two) times daily. (Patient not taking: Reported on 08/26/2018), Disp: 60 tablet, Rfl: 3 .  LANTUS SOLOSTAR 100 UNIT/ML Solostar Pen, INJECT 5 TO 10 UNITS AT BEDTIME ( SLOWLY INCREASE UNTIL FASTING GLUCOSE <130, Disp: , Rfl: 6    Family History  Problem Relation Age of Onset  . Hypertension Other      Social History   Tobacco Use  . Smoking status: Never Smoker  . Smokeless tobacco: Never Used  Substance Use Topics   . Alcohol use: No  . Drug use: No    Allergies as of 08/26/2018  . (No Known Allergies)    Review of Systems:    All systems reviewed and negative except where noted in HPI.   Physical Exam:  BP 116/67 (BP Location: Left Arm, Patient Position: Sitting, Cuff Size: Normal)   Pulse (!) 101   Resp 17   Wt 129 lb (58.5 kg)   BMI 20.82 kg/m  No LMP recorded. Patient has had a hysterectomy.  General:   Alert, frail, pleasant and cooperative in NAD Head:  Normocephalic and atraumatic. Eyes:  Sclera clear, no icterus.   Conjunctiva pink. Ears:  Normal auditory acuity. Nose:  No deformity, discharge, or lesions. Mouth:  No deformity or lesions,oropharynx pink & moist. Neck:  Supple; no masses or thyromegaly. Lungs:  Respirations even and unlabored.  Clear throughout to auscultation.   No wheezes, crackles, or rhonchi. No acute distress. Heart:  Regular rate and rhythm; no murmurs, clicks, rubs, or gallops.  Abdomen:  Normal bowel sounds. Soft, non-tender and non-distended without masses, hepatosplenomegaly or hernias noted.  No guarding or rebound tenderness.   Rectal: Not performed Msk:  Symmetrical without gross deformities. Good, equal movement & strength bilaterally. Pulses:  Normal pulses noted. Extremities:  No clubbing or edema.  No cyanosis. Neurologic:  Alert and oriented x2;  grossly normal neurologically, involuntary tremors. Psych:  Alert and cooperative. Normal mood and affect.  Imaging Studies: No abdominal imaging  Assessment and Plan:   Carrie Bishop is a 85 y.o. Caucasian female with history of dementia, Parkinson's with chronic iron deficiency anemia, with no evidence of active GI bleed, admission to [facility] in 02/2018 with acute on chronic severe symptomatic iron deficiency anemia. She underwent endoscopic evaluation including EGD and colonoscopy unrevealing, small bowel capsule endoscopy revealed nonbleeding distal and mid small bowel  AVMs. Iron deficiency anemia resolved and no signs of active GI bleed  Recheck CBC, iron studies today    Follow up as needed   Cephas Darby, MD

## 2018-08-27 LAB — FERRITIN: Ferritin: 29 ng/mL (ref 15–150)

## 2018-08-27 LAB — CBC
Hematocrit: 38.6 % (ref 34.0–46.6)
Hemoglobin: 12.6 g/dL (ref 11.1–15.9)
MCH: 30.1 pg (ref 26.6–33.0)
MCHC: 32.6 g/dL (ref 31.5–35.7)
MCV: 92 fL (ref 79–97)
PLATELETS: 234 10*3/uL (ref 150–450)
RBC: 4.19 x10E6/uL (ref 3.77–5.28)
RDW: 13 % (ref 11.7–15.4)
WBC: 6.1 10*3/uL (ref 3.4–10.8)

## 2018-08-27 LAB — IRON AND TIBC
IRON SATURATION: 21 % (ref 15–55)
Iron: 65 ug/dL (ref 27–139)
Total Iron Binding Capacity: 303 ug/dL (ref 250–450)
UIBC: 238 ug/dL (ref 118–369)

## 2018-08-27 LAB — B12 AND FOLATE PANEL
FOLATE: 19.2 ng/mL (ref >3.0)
VITAMIN B 12: 250 pg/mL (ref 232–1245)

## 2018-11-12 DIAGNOSIS — I10 Essential (primary) hypertension: Secondary | ICD-10-CM | POA: Diagnosis not present

## 2018-11-12 DIAGNOSIS — G250 Essential tremor: Secondary | ICD-10-CM | POA: Diagnosis not present

## 2018-11-12 DIAGNOSIS — E118 Type 2 diabetes mellitus with unspecified complications: Secondary | ICD-10-CM | POA: Diagnosis not present

## 2018-11-12 DIAGNOSIS — M79604 Pain in right leg: Secondary | ICD-10-CM | POA: Diagnosis not present

## 2018-11-12 DIAGNOSIS — E7800 Pure hypercholesterolemia, unspecified: Secondary | ICD-10-CM | POA: Diagnosis not present

## 2018-11-12 DIAGNOSIS — I69359 Hemiplegia and hemiparesis following cerebral infarction affecting unspecified side: Secondary | ICD-10-CM | POA: Diagnosis not present

## 2018-11-12 DIAGNOSIS — F324 Major depressive disorder, single episode, in partial remission: Secondary | ICD-10-CM | POA: Diagnosis not present

## 2018-12-26 DIAGNOSIS — G250 Essential tremor: Secondary | ICD-10-CM | POA: Diagnosis not present

## 2019-01-18 ENCOUNTER — Emergency Department: Payer: Medicare HMO

## 2019-01-18 ENCOUNTER — Emergency Department
Admission: EM | Admit: 2019-01-18 | Discharge: 2019-01-19 | Disposition: A | Discharge status: Home / Self Care | Payer: Medicare HMO | Attending: Emergency Medicine | Admitting: Emergency Medicine

## 2019-01-18 ENCOUNTER — Other Ambulatory Visit: Payer: Self-pay

## 2019-01-18 DIAGNOSIS — N3090 Cystitis, unspecified without hematuria: Secondary | ICD-10-CM | POA: Insufficient documentation

## 2019-01-18 DIAGNOSIS — G309 Alzheimer's disease, unspecified: Secondary | ICD-10-CM | POA: Diagnosis not present

## 2019-01-18 DIAGNOSIS — N3001 Acute cystitis with hematuria: Secondary | ICD-10-CM

## 2019-01-18 DIAGNOSIS — F0150 Vascular dementia without behavioral disturbance: Secondary | ICD-10-CM | POA: Insufficient documentation

## 2019-01-18 DIAGNOSIS — H539 Unspecified visual disturbance: Secondary | ICD-10-CM | POA: Diagnosis not present

## 2019-01-18 DIAGNOSIS — Z8673 Personal history of transient ischemic attack (TIA), and cerebral infarction without residual deficits: Secondary | ICD-10-CM | POA: Diagnosis not present

## 2019-01-18 DIAGNOSIS — H538 Other visual disturbances: Secondary | ICD-10-CM | POA: Diagnosis not present

## 2019-01-18 DIAGNOSIS — H748X3 Other specified disorders of middle ear and mastoid, bilateral: Secondary | ICD-10-CM | POA: Diagnosis not present

## 2019-01-18 DIAGNOSIS — I10 Essential (primary) hypertension: Secondary | ICD-10-CM | POA: Insufficient documentation

## 2019-01-18 DIAGNOSIS — E119 Type 2 diabetes mellitus without complications: Secondary | ICD-10-CM | POA: Insufficient documentation

## 2019-01-18 DIAGNOSIS — G311 Senile degeneration of brain, not elsewhere classified: Secondary | ICD-10-CM | POA: Diagnosis not present

## 2019-01-18 LAB — BASIC METABOLIC PANEL
Anion gap: 12 (ref 5–15)
BUN: 31 mg/dL — ABNORMAL HIGH (ref 8–23)
CO2: 25 mmol/L (ref 22–32)
Calcium: 9.9 mg/dL (ref 8.9–10.3)
Chloride: 99 mmol/L (ref 98–111)
Creatinine, Ser: 0.7 mg/dL (ref 0.44–1.00)
GFR calc Af Amer: >60 mL/min (ref >60)
GFR calc non Af Amer: >60 mL/min (ref >60)
Glucose, Bld: 227 mg/dL — ABNORMAL HIGH (ref 70–99)
Potassium: 4 mmol/L (ref 3.5–5.1)
Sodium: 136 mmol/L (ref 135–145)

## 2019-01-18 LAB — URINALYSIS, COMPLETE (UACMP) WITH MICROSCOPIC
Bilirubin Urine: NEGATIVE
Glucose, UA: NEGATIVE mg/dL
Hgb urine dipstick: NEGATIVE
Ketones, ur: 5 mg/dL — AB
Nitrite: NEGATIVE
Protein, ur: 30 mg/dL — AB
Specific Gravity, Urine: 1.023 (ref 1.005–1.030)
WBC, UA: >50 WBC/hpf — ABNORMAL HIGH (ref 0–5)
pH: 5 (ref 5.0–8.0)

## 2019-01-18 LAB — CBC
HCT: 38.8 % (ref 36.0–46.0)
Hemoglobin: 12.7 g/dL (ref 12.0–15.0)
MCH: 29.7 pg (ref 26.0–34.0)
MCHC: 32.7 g/dL (ref 30.0–36.0)
MCV: 90.7 fL (ref 80.0–100.0)
Platelets: 284 10*3/uL (ref 150–400)
RBC: 4.28 MIL/uL (ref 3.87–5.11)
RDW: 15.3 % (ref 11.5–15.5)
WBC: 7.6 10*3/uL (ref 4.0–10.5)
nRBC: 0 % (ref 0.0–0.2)

## 2019-01-18 MED ORDER — CEPHALEXIN 250 MG PO CAPS: 250.0000 mg | ORAL_CAPSULE | Freq: Four times a day (QID) | ORAL | 0 refills | Status: AC

## 2019-01-18 MED ORDER — SODIUM CHLORIDE 0.9 % IV SOLN
1.0000 g | Freq: Once | INTRAVENOUS | Status: AC
  Administered 2019-01-18: 1 g via INTRAVENOUS
  Filled 2019-01-18: qty 10

## 2019-01-18 MED ORDER — SODIUM CHLORIDE 0.9 % IV SOLN
Freq: Once | INTRAVENOUS | Status: AC
  Administered 2019-01-18: 20:00:00 via INTRAVENOUS

## 2019-01-18 MED ORDER — LORAZEPAM 0.5 MG PO TABS
0.5000 mg | ORAL_TABLET | Freq: Once | ORAL | Status: AC
  Administered 2019-01-18: 0.5 mg via ORAL
  Filled 2019-01-18: qty 1

## 2019-01-18 NOTE — ED Provider Notes (Signed)
[facility] Emergency Department Provider Note       Time seen: ----------------------------------------- 7:36 PM on 01/18/2019 -----------------------------------------   I have reviewed the triage vital signs and the nursing notes.  HISTORY   Chief Complaint Visual Field Change and Decreased appetite    HPI Carrie Bishop is a 85 y.o. female with a history of anemia, depression, diabetes, hypertension, stroke, restless leg syndrome, dementia who presents to the ED for vision changes.  Patient has been verbalizing to her daughter that she cannot see since 2:00 this afternoon.  Reportedly she has macular degeneration that was being treated over the last year, however treatment was stopped because she was worsening and they were not able to provide any further treatment.  Daughter states she still appeared to be able to see out of the left eye at home.  Also reports p.o. intake has been poor she has had episodes of diaphoresis.  Past Medical History:  Diagnosis Date  . Acute posthemorrhagic anemia 04/03/2016  . Anemia 10/20/2015  . Chest pain 06/17/2017  . Depression   . Diabetes mellitus without complication ([place])   . GI bleed 03/12/2018  . Hip fracture [facility])    left March 2017  . Hypertension   . Neuropathy   . Osteoporosis   . Restless leg syndrome   . Stroke ([place])   . Symptomatic anemia 03/12/2018  . Tremor     Patient Active Problem List   Diagnosis Date Noted  . Acquired arteriovenous malformation of small intestine 04/01/2018  . Closed fracture of radial styloid 01/25/2017  . Mixed Alzheimer's and vascular dementia ([place]) 05/23/2016  . S/P ORIF (open reduction internal fixation) fracture 04/03/2016  . Essential hypertension 04/03/2016  . Closed intertrochanteric fracture of femur ([place]) 03/30/2016  . Osteoporosis 11/07/2015  . Insomnia 11/07/2015  . Neuropathic pain 11/07/2015  . Depression 11/07/2015  . DM type 2 (diabetes mellitus, type 2)  ([place]) 11/01/2015  . Intertrochanteric fracture of left hip ([place]) 10/20/2015  . Chronic renal insufficiency 10/20/2015  . Closed left hip fracture ([place]) 10/20/2015  . Benign essential tremor 09/20/2015    Past Surgical History:  Procedure Laterality Date  . ABDOMINAL HYSTERECTOMY    . arm surgery    . COLONOSCOPY WITH PROPOFOL N/A 03/14/2018   Procedure: COLONOSCOPY WITH PROPOFOL;  Surgeon: Lin Landsman, MD;  Location: [facility] ENDOSCOPY;  Service: Gastroenterology;  Laterality: N/A;  . ESOPHAGOGASTRODUODENOSCOPY (EGD) WITH PROPOFOL N/A 03/14/2018   Procedure: ESOPHAGOGASTRODUODENOSCOPY (EGD) WITH PROPOFOL;  Surgeon: Lin Landsman, MD;  Location: [facility] ENDOSCOPY;  Service: Gastroenterology;  Laterality: N/A;  . GIVENS CAPSULE STUDY N/A 03/15/2018   Procedure: GIVENS CAPSULE STUDY;  Surgeon: Jonathon Bellows, MD;  Location: [facility] ENDOSCOPY;  Service: Gastroenterology;  Laterality: N/A;  . INTRAMEDULLARY (IM) NAIL INTERTROCHANTERIC Left 10/21/2015   Procedure: INTRAMEDULLARY (IM) NAIL INTERTROCHANTRIC LEFT LONG AFFIXUS;  Surgeon: Hessie Knows, MD;  Location: [facility] ORS;  Service: Orthopedics;  Laterality: Left;  . INTRAMEDULLARY (IM) NAIL INTERTROCHANTERIC Right 03/31/2016   Procedure: INTRAMEDULLARY (IM) NAIL INTERTROCHANTRIC;  Surgeon: Dereck Leep, MD;  Location: [facility] ORS;  Service: Orthopedics;  Laterality: Right;  . LEG SURGERY      Allergies Patient has no known allergies.  Social History Social History   Tobacco Use  . Smoking status: Never Smoker  . Smokeless tobacco: Never Used  Substance Use Topics  . Alcohol use: No  . Drug use: No   Review of Systems Constitutional: Negative for fever. Eyes: Positive for vision changes  Cardiovascular: Negative for chest pain. Respiratory: Negative for shortness of breath. Gastrointestinal: Negative for abdominal pain, vomiting and diarrhea. Musculoskeletal: Negative for back pain. Skin: Negative for rash. Neurological: Negative for  headaches, focal weakness or numbness.  All systems negative/normal/unremarkable except as stated in the HPI  ____________________________________________   PHYSICAL EXAM:  VITAL SIGNS: ED Triage Vitals  Enc Vitals Group     BP 01/18/19 1816 128/79     Pulse Rate 01/18/19 1816 94     Resp 01/18/19 1816 16     Temp 01/18/19 1806 98.1 F (36.7 C)     Temp Source 01/18/19 1806 Oral     SpO2 01/18/19 1816 97 %     Weight 01/18/19 1816 128 lb 15.5 oz (58.5 kg)     Height --      Head Circumference --      Peak Flow --      Pain Score 01/18/19 1816 0     Pain Loc --      Pain Edu? --      Excl. in [place]? --    Constitutional: Alert and oriented.  Anxious, no distress Eyes: Conjunctivae are normal. Normal extraocular movements.  Anterior chambers appear unremarkable ENT      Head: Normocephalic and atraumatic.      Nose: No congestion/rhinnorhea.      Mouth/Throat: Mucous membranes are moist.      Neck: No stridor. Cardiovascular: Normal rate, regular rhythm. No murmurs, rubs, or gallops. Respiratory: Normal respiratory effort without tachypnea nor retractions. Breath sounds are clear and equal bilaterally. No wheezes/rales/rhonchi. Gastrointestinal: Soft and nontender. Normal bowel sounds Musculoskeletal: Nontender with normal range of motion in extremities. No lower extremity tenderness nor edema. Neurologic:  . No gross focal neurologic deficits are appreciated.  Skin:  Skin is warm, dry and intact. No rash noted. Psychiatric: Anxious mood and affect ___________________________________________  ED COURSE:  As part of my medical decision making, I reviewed the following data within the [place] History obtained from family if available, nursing notes, old chart and ekg, as well as notes from prior ED visits. Patient presented for vision changes, we will assess with labs and imaging as indicated at this time.   Procedures  Carrie Bishop was evaluated in  Emergency Department on 01/18/2019 for the symptoms described in the history of present illness. She was evaluated in the context of the global COVID-19 pandemic, which necessitated consideration that the patient might be at risk for infection with the SARS-CoV-2 virus that causes COVID-19. Institutional protocols and algorithms that pertain to the evaluation of patients at risk for COVID-19 are in a state of rapid change based on information released by regulatory bodies including the CDC and federal and state organizations. These policies and algorithms were followed during the patient's care in the ED.  ____________________________________________   LABS (pertinent positives/negatives)  Labs Reviewed  BASIC METABOLIC PANEL - Abnormal; Notable for the following components:      Result Value   Glucose, Bld 227 (*)    BUN 31 (*)    All other components within normal limits  URINALYSIS, COMPLETE (UACMP) WITH MICROSCOPIC - Abnormal; Notable for the following components:   Color, Urine YELLOW (*)    APPearance CLOUDY (*)    Ketones, ur 5 (*)    Protein, ur 30 (*)    Leukocytes,Ua LARGE (*)    WBC, UA >50 (*)    Bacteria, UA FEW (*)    All other components  within normal limits  URINE CULTURE  CBC    RADIOLOGY  CT head IMPRESSION: 1. No evidence of acute intracranial abnormality. 2. Atrophy, chronic small-vessel white matter ischemic changes and remote basal ganglia and LEFT thalamic infarcts. 3. New LEFT mastoid effusion and chronic partial RIGHT mastoid effusion.  ____________________________________________   DIFFERENTIAL DIAGNOSIS   Anxiety, macular degeneration, dehydration, electrolyte abnormality, occult infection  FINAL ASSESSMENT AND PLAN  Vision changes, dementia, cystitis   Plan: The patient had presented for vision changes.  She does have poor vision in the left eye but it is not clear if this is acute or chronic.  She does have a history of macular degeneration  and has stopped receiving therapy on this eye.  Patient's labs did indicate some degree of dehydration as well as urinary tract infection for which we sent a urine culture and given IV Rocephin. Patient's imaging did not reveal any acute process other than mastoid effusion.  She will be given antibiotics and will be referred to ophthalmology for outpatient follow-up.   Laurence Aly, MD    Note: This note was generated in part or whole with voice recognition software. Voice recognition is usually quite accurate but there are transcription errors that can and very often do occur. I apologize for any typographical errors that were not detected and corrected.     Earleen Newport, MD 01/18/19 2230

## 2019-01-18 NOTE — ED Triage Notes (Signed)
Pts daughter reports pt has been verbalizing she can not see and she is having visual changes since 1400 this afternoon. Pt has macular degeneration in the left eye (the eye pt has been reporting visual changes in). No other neuro changes noted upon assessment. Pts daughter also reports pt has not been eating appropriately. Hx of dementia with decreased activity.

## 2019-01-18 NOTE — ED Triage Notes (Signed)
First Nurse Note:  Arrives with daughter who states for the past two days patient's PO intake has been poor, patient has not been taking medications, having episodes of feeling 'clammy', and patient c/o not being able to see.  Patient has history and dementia.

## 2019-01-20 LAB — URINE CULTURE

## 2019-01-22 DIAGNOSIS — R413 Other amnesia: Secondary | ICD-10-CM | POA: Diagnosis not present

## 2019-01-22 DIAGNOSIS — F324 Major depressive disorder, single episode, in partial remission: Secondary | ICD-10-CM | POA: Diagnosis not present

## 2019-01-22 DIAGNOSIS — E118 Type 2 diabetes mellitus with unspecified complications: Secondary | ICD-10-CM | POA: Diagnosis not present

## 2019-01-22 DIAGNOSIS — Z681 Body mass index (BMI) 19 or less, adult: Secondary | ICD-10-CM | POA: Diagnosis not present

## 2019-01-22 DIAGNOSIS — N390 Urinary tract infection, site not specified: Secondary | ICD-10-CM | POA: Diagnosis not present

## 2019-01-22 DIAGNOSIS — R410 Disorientation, unspecified: Secondary | ICD-10-CM | POA: Diagnosis not present

## 2019-01-22 DIAGNOSIS — H6120 Impacted cerumen, unspecified ear: Secondary | ICD-10-CM | POA: Diagnosis not present

## 2019-01-23 DIAGNOSIS — N390 Urinary tract infection, site not specified: Secondary | ICD-10-CM | POA: Diagnosis not present

## 2019-02-05 DIAGNOSIS — F0280 Dementia in other diseases classified elsewhere without behavioral disturbance: Secondary | ICD-10-CM | POA: Diagnosis not present

## 2019-02-05 DIAGNOSIS — G309 Alzheimer's disease, unspecified: Secondary | ICD-10-CM | POA: Diagnosis not present

## 2019-02-05 DIAGNOSIS — R269 Unspecified abnormalities of gait and mobility: Secondary | ICD-10-CM | POA: Diagnosis not present

## 2019-02-05 DIAGNOSIS — F0150 Vascular dementia without behavioral disturbance: Secondary | ICD-10-CM | POA: Diagnosis not present

## 2019-02-05 DIAGNOSIS — G250 Essential tremor: Secondary | ICD-10-CM | POA: Diagnosis not present

## 2019-02-11 DIAGNOSIS — I10 Essential (primary) hypertension: Secondary | ICD-10-CM | POA: Diagnosis not present

## 2019-02-11 DIAGNOSIS — E119 Type 2 diabetes mellitus without complications: Secondary | ICD-10-CM | POA: Diagnosis not present

## 2019-02-11 DIAGNOSIS — M79604 Pain in right leg: Secondary | ICD-10-CM | POA: Diagnosis not present

## 2019-02-11 DIAGNOSIS — F324 Major depressive disorder, single episode, in partial remission: Secondary | ICD-10-CM | POA: Diagnosis not present

## 2019-02-11 DIAGNOSIS — G250 Essential tremor: Secondary | ICD-10-CM | POA: Diagnosis not present

## 2019-02-11 DIAGNOSIS — M79605 Pain in left leg: Secondary | ICD-10-CM | POA: Diagnosis not present

## 2019-02-11 DIAGNOSIS — Z681 Body mass index (BMI) 19 or less, adult: Secondary | ICD-10-CM | POA: Diagnosis not present

## 2019-02-11 DIAGNOSIS — I69359 Hemiplegia and hemiparesis following cerebral infarction affecting unspecified side: Secondary | ICD-10-CM | POA: Diagnosis not present

## 2019-02-11 DIAGNOSIS — E7800 Pure hypercholesterolemia, unspecified: Secondary | ICD-10-CM | POA: Diagnosis not present

## 2019-03-05 DIAGNOSIS — R269 Unspecified abnormalities of gait and mobility: Secondary | ICD-10-CM | POA: Diagnosis not present

## 2019-03-05 DIAGNOSIS — R1310 Dysphagia, unspecified: Secondary | ICD-10-CM | POA: Diagnosis not present

## 2019-03-05 DIAGNOSIS — G250 Essential tremor: Secondary | ICD-10-CM | POA: Diagnosis not present

## 2019-03-05 DIAGNOSIS — Z7401 Bed confinement status: Secondary | ICD-10-CM | POA: Diagnosis not present

## 2019-03-20 DIAGNOSIS — Z7401 Bed confinement status: Secondary | ICD-10-CM | POA: Diagnosis not present

## 2019-03-20 DIAGNOSIS — R1310 Dysphagia, unspecified: Secondary | ICD-10-CM | POA: Diagnosis not present

## 2019-04-13 DIAGNOSIS — Z7401 Bed confinement status: Secondary | ICD-10-CM | POA: Diagnosis not present

## 2019-04-13 DIAGNOSIS — I69359 Hemiplegia and hemiparesis following cerebral infarction affecting unspecified side: Secondary | ICD-10-CM | POA: Diagnosis not present

## 2019-04-13 DIAGNOSIS — G250 Essential tremor: Secondary | ICD-10-CM | POA: Diagnosis not present

## 2019-04-13 DIAGNOSIS — L89159 Pressure ulcer of sacral region, unspecified stage: Secondary | ICD-10-CM | POA: Diagnosis not present

## 2019-04-17 DIAGNOSIS — L89152 Pressure ulcer of sacral region, stage 2: Secondary | ICD-10-CM | POA: Diagnosis not present

## 2019-04-17 DIAGNOSIS — I739 Peripheral vascular disease, unspecified: Secondary | ICD-10-CM | POA: Diagnosis not present

## 2019-04-17 DIAGNOSIS — G4700 Insomnia, unspecified: Secondary | ICD-10-CM | POA: Diagnosis not present

## 2019-04-17 DIAGNOSIS — E1140 Type 2 diabetes mellitus with diabetic neuropathy, unspecified: Secondary | ICD-10-CM | POA: Diagnosis not present

## 2019-04-17 DIAGNOSIS — G250 Essential tremor: Secondary | ICD-10-CM | POA: Diagnosis not present

## 2019-04-17 DIAGNOSIS — F0390 Unspecified dementia without behavioral disturbance: Secondary | ICD-10-CM | POA: Diagnosis not present

## 2019-04-17 DIAGNOSIS — M810 Age-related osteoporosis without current pathological fracture: Secondary | ICD-10-CM | POA: Diagnosis not present

## 2019-04-17 DIAGNOSIS — I69351 Hemiplegia and hemiparesis following cerebral infarction affecting right dominant side: Secondary | ICD-10-CM | POA: Diagnosis not present

## 2019-04-17 DIAGNOSIS — E538 Deficiency of other specified B group vitamins: Secondary | ICD-10-CM | POA: Diagnosis not present

## 2019-04-20 DIAGNOSIS — R1310 Dysphagia, unspecified: Secondary | ICD-10-CM | POA: Diagnosis not present

## 2019-04-20 DIAGNOSIS — Z7401 Bed confinement status: Secondary | ICD-10-CM | POA: Diagnosis not present

## 2019-04-21 DIAGNOSIS — E538 Deficiency of other specified B group vitamins: Secondary | ICD-10-CM | POA: Diagnosis not present

## 2019-04-21 DIAGNOSIS — M810 Age-related osteoporosis without current pathological fracture: Secondary | ICD-10-CM | POA: Diagnosis not present

## 2019-04-21 DIAGNOSIS — G250 Essential tremor: Secondary | ICD-10-CM | POA: Diagnosis not present

## 2019-04-21 DIAGNOSIS — L89152 Pressure ulcer of sacral region, stage 2: Secondary | ICD-10-CM | POA: Diagnosis not present

## 2019-04-21 DIAGNOSIS — F0390 Unspecified dementia without behavioral disturbance: Secondary | ICD-10-CM | POA: Diagnosis not present

## 2019-04-21 DIAGNOSIS — E1140 Type 2 diabetes mellitus with diabetic neuropathy, unspecified: Secondary | ICD-10-CM | POA: Diagnosis not present

## 2019-04-21 DIAGNOSIS — I69351 Hemiplegia and hemiparesis following cerebral infarction affecting right dominant side: Secondary | ICD-10-CM | POA: Diagnosis not present

## 2019-04-21 DIAGNOSIS — G4700 Insomnia, unspecified: Secondary | ICD-10-CM | POA: Diagnosis not present

## 2019-04-21 DIAGNOSIS — I739 Peripheral vascular disease, unspecified: Secondary | ICD-10-CM | POA: Diagnosis not present

## 2019-04-22 DIAGNOSIS — F0390 Unspecified dementia without behavioral disturbance: Secondary | ICD-10-CM | POA: Diagnosis not present

## 2019-04-22 DIAGNOSIS — E538 Deficiency of other specified B group vitamins: Secondary | ICD-10-CM | POA: Diagnosis not present

## 2019-04-22 DIAGNOSIS — I69351 Hemiplegia and hemiparesis following cerebral infarction affecting right dominant side: Secondary | ICD-10-CM | POA: Diagnosis not present

## 2019-04-22 DIAGNOSIS — I739 Peripheral vascular disease, unspecified: Secondary | ICD-10-CM | POA: Diagnosis not present

## 2019-04-22 DIAGNOSIS — G4700 Insomnia, unspecified: Secondary | ICD-10-CM | POA: Diagnosis not present

## 2019-04-22 DIAGNOSIS — E1140 Type 2 diabetes mellitus with diabetic neuropathy, unspecified: Secondary | ICD-10-CM | POA: Diagnosis not present

## 2019-04-22 DIAGNOSIS — M810 Age-related osteoporosis without current pathological fracture: Secondary | ICD-10-CM | POA: Diagnosis not present

## 2019-04-22 DIAGNOSIS — G250 Essential tremor: Secondary | ICD-10-CM | POA: Diagnosis not present

## 2019-04-22 DIAGNOSIS — L89152 Pressure ulcer of sacral region, stage 2: Secondary | ICD-10-CM | POA: Diagnosis not present

## 2019-04-23 DIAGNOSIS — G4700 Insomnia, unspecified: Secondary | ICD-10-CM | POA: Diagnosis not present

## 2019-04-23 DIAGNOSIS — M810 Age-related osteoporosis without current pathological fracture: Secondary | ICD-10-CM | POA: Diagnosis not present

## 2019-04-23 DIAGNOSIS — E1140 Type 2 diabetes mellitus with diabetic neuropathy, unspecified: Secondary | ICD-10-CM | POA: Diagnosis not present

## 2019-04-23 DIAGNOSIS — G250 Essential tremor: Secondary | ICD-10-CM | POA: Diagnosis not present

## 2019-04-23 DIAGNOSIS — E538 Deficiency of other specified B group vitamins: Secondary | ICD-10-CM | POA: Diagnosis not present

## 2019-04-23 DIAGNOSIS — L89152 Pressure ulcer of sacral region, stage 2: Secondary | ICD-10-CM | POA: Diagnosis not present

## 2019-04-23 DIAGNOSIS — I69351 Hemiplegia and hemiparesis following cerebral infarction affecting right dominant side: Secondary | ICD-10-CM | POA: Diagnosis not present

## 2019-04-23 DIAGNOSIS — F0390 Unspecified dementia without behavioral disturbance: Secondary | ICD-10-CM | POA: Diagnosis not present

## 2019-04-23 DIAGNOSIS — I739 Peripheral vascular disease, unspecified: Secondary | ICD-10-CM | POA: Diagnosis not present

## 2019-04-24 DIAGNOSIS — G4700 Insomnia, unspecified: Secondary | ICD-10-CM | POA: Diagnosis not present

## 2019-04-24 DIAGNOSIS — M810 Age-related osteoporosis without current pathological fracture: Secondary | ICD-10-CM | POA: Diagnosis not present

## 2019-04-24 DIAGNOSIS — I69351 Hemiplegia and hemiparesis following cerebral infarction affecting right dominant side: Secondary | ICD-10-CM | POA: Diagnosis not present

## 2019-04-24 DIAGNOSIS — L89152 Pressure ulcer of sacral region, stage 2: Secondary | ICD-10-CM | POA: Diagnosis not present

## 2019-04-24 DIAGNOSIS — I739 Peripheral vascular disease, unspecified: Secondary | ICD-10-CM | POA: Diagnosis not present

## 2019-04-24 DIAGNOSIS — E1140 Type 2 diabetes mellitus with diabetic neuropathy, unspecified: Secondary | ICD-10-CM | POA: Diagnosis not present

## 2019-04-24 DIAGNOSIS — E538 Deficiency of other specified B group vitamins: Secondary | ICD-10-CM | POA: Diagnosis not present

## 2019-04-24 DIAGNOSIS — G250 Essential tremor: Secondary | ICD-10-CM | POA: Diagnosis not present

## 2019-04-24 DIAGNOSIS — F0390 Unspecified dementia without behavioral disturbance: Secondary | ICD-10-CM | POA: Diagnosis not present

## 2019-04-27 DIAGNOSIS — Z7401 Bed confinement status: Secondary | ICD-10-CM | POA: Diagnosis not present

## 2019-04-27 DIAGNOSIS — L89152 Pressure ulcer of sacral region, stage 2: Secondary | ICD-10-CM | POA: Diagnosis not present

## 2019-04-28 DIAGNOSIS — M810 Age-related osteoporosis without current pathological fracture: Secondary | ICD-10-CM | POA: Diagnosis not present

## 2019-04-28 DIAGNOSIS — G250 Essential tremor: Secondary | ICD-10-CM | POA: Diagnosis not present

## 2019-04-28 DIAGNOSIS — I739 Peripheral vascular disease, unspecified: Secondary | ICD-10-CM | POA: Diagnosis not present

## 2019-04-28 DIAGNOSIS — I69351 Hemiplegia and hemiparesis following cerebral infarction affecting right dominant side: Secondary | ICD-10-CM | POA: Diagnosis not present

## 2019-04-28 DIAGNOSIS — G4700 Insomnia, unspecified: Secondary | ICD-10-CM | POA: Diagnosis not present

## 2019-04-28 DIAGNOSIS — F0390 Unspecified dementia without behavioral disturbance: Secondary | ICD-10-CM | POA: Diagnosis not present

## 2019-04-28 DIAGNOSIS — E538 Deficiency of other specified B group vitamins: Secondary | ICD-10-CM | POA: Diagnosis not present

## 2019-04-28 DIAGNOSIS — E1140 Type 2 diabetes mellitus with diabetic neuropathy, unspecified: Secondary | ICD-10-CM | POA: Diagnosis not present

## 2019-04-28 DIAGNOSIS — L89152 Pressure ulcer of sacral region, stage 2: Secondary | ICD-10-CM | POA: Diagnosis not present

## 2019-04-30 DIAGNOSIS — M810 Age-related osteoporosis without current pathological fracture: Secondary | ICD-10-CM | POA: Diagnosis not present

## 2019-04-30 DIAGNOSIS — G4700 Insomnia, unspecified: Secondary | ICD-10-CM | POA: Diagnosis not present

## 2019-04-30 DIAGNOSIS — G250 Essential tremor: Secondary | ICD-10-CM | POA: Diagnosis not present

## 2019-04-30 DIAGNOSIS — E1140 Type 2 diabetes mellitus with diabetic neuropathy, unspecified: Secondary | ICD-10-CM | POA: Diagnosis not present

## 2019-04-30 DIAGNOSIS — I69351 Hemiplegia and hemiparesis following cerebral infarction affecting right dominant side: Secondary | ICD-10-CM | POA: Diagnosis not present

## 2019-04-30 DIAGNOSIS — E538 Deficiency of other specified B group vitamins: Secondary | ICD-10-CM | POA: Diagnosis not present

## 2019-04-30 DIAGNOSIS — F0390 Unspecified dementia without behavioral disturbance: Secondary | ICD-10-CM | POA: Diagnosis not present

## 2019-04-30 DIAGNOSIS — I739 Peripheral vascular disease, unspecified: Secondary | ICD-10-CM | POA: Diagnosis not present

## 2019-04-30 DIAGNOSIS — L89152 Pressure ulcer of sacral region, stage 2: Secondary | ICD-10-CM | POA: Diagnosis not present

## 2019-05-01 DIAGNOSIS — I739 Peripheral vascular disease, unspecified: Secondary | ICD-10-CM | POA: Diagnosis not present

## 2019-05-01 DIAGNOSIS — F0390 Unspecified dementia without behavioral disturbance: Secondary | ICD-10-CM | POA: Diagnosis not present

## 2019-05-01 DIAGNOSIS — M810 Age-related osteoporosis without current pathological fracture: Secondary | ICD-10-CM | POA: Diagnosis not present

## 2019-05-01 DIAGNOSIS — L89152 Pressure ulcer of sacral region, stage 2: Secondary | ICD-10-CM | POA: Diagnosis not present

## 2019-05-01 DIAGNOSIS — E538 Deficiency of other specified B group vitamins: Secondary | ICD-10-CM | POA: Diagnosis not present

## 2019-05-01 DIAGNOSIS — G250 Essential tremor: Secondary | ICD-10-CM | POA: Diagnosis not present

## 2019-05-01 DIAGNOSIS — I69351 Hemiplegia and hemiparesis following cerebral infarction affecting right dominant side: Secondary | ICD-10-CM | POA: Diagnosis not present

## 2019-05-01 DIAGNOSIS — E1140 Type 2 diabetes mellitus with diabetic neuropathy, unspecified: Secondary | ICD-10-CM | POA: Diagnosis not present

## 2019-05-01 DIAGNOSIS — G4700 Insomnia, unspecified: Secondary | ICD-10-CM | POA: Diagnosis not present

## 2019-05-05 DIAGNOSIS — L89152 Pressure ulcer of sacral region, stage 2: Secondary | ICD-10-CM | POA: Diagnosis not present

## 2019-05-05 DIAGNOSIS — M810 Age-related osteoporosis without current pathological fracture: Secondary | ICD-10-CM | POA: Diagnosis not present

## 2019-05-05 DIAGNOSIS — G4700 Insomnia, unspecified: Secondary | ICD-10-CM | POA: Diagnosis not present

## 2019-05-05 DIAGNOSIS — E1140 Type 2 diabetes mellitus with diabetic neuropathy, unspecified: Secondary | ICD-10-CM | POA: Diagnosis not present

## 2019-05-05 DIAGNOSIS — E538 Deficiency of other specified B group vitamins: Secondary | ICD-10-CM | POA: Diagnosis not present

## 2019-05-05 DIAGNOSIS — I69351 Hemiplegia and hemiparesis following cerebral infarction affecting right dominant side: Secondary | ICD-10-CM | POA: Diagnosis not present

## 2019-05-05 DIAGNOSIS — G250 Essential tremor: Secondary | ICD-10-CM | POA: Diagnosis not present

## 2019-05-05 DIAGNOSIS — F0390 Unspecified dementia without behavioral disturbance: Secondary | ICD-10-CM | POA: Diagnosis not present

## 2019-05-05 DIAGNOSIS — I739 Peripheral vascular disease, unspecified: Secondary | ICD-10-CM | POA: Diagnosis not present

## 2019-05-06 DIAGNOSIS — E1140 Type 2 diabetes mellitus with diabetic neuropathy, unspecified: Secondary | ICD-10-CM | POA: Diagnosis not present

## 2019-05-06 DIAGNOSIS — F0390 Unspecified dementia without behavioral disturbance: Secondary | ICD-10-CM | POA: Diagnosis not present

## 2019-05-06 DIAGNOSIS — I739 Peripheral vascular disease, unspecified: Secondary | ICD-10-CM | POA: Diagnosis not present

## 2019-05-06 DIAGNOSIS — E538 Deficiency of other specified B group vitamins: Secondary | ICD-10-CM | POA: Diagnosis not present

## 2019-05-06 DIAGNOSIS — M810 Age-related osteoporosis without current pathological fracture: Secondary | ICD-10-CM | POA: Diagnosis not present

## 2019-05-06 DIAGNOSIS — G4700 Insomnia, unspecified: Secondary | ICD-10-CM | POA: Diagnosis not present

## 2019-05-06 DIAGNOSIS — G250 Essential tremor: Secondary | ICD-10-CM | POA: Diagnosis not present

## 2019-05-06 DIAGNOSIS — L89152 Pressure ulcer of sacral region, stage 2: Secondary | ICD-10-CM | POA: Diagnosis not present

## 2019-05-06 DIAGNOSIS — I69351 Hemiplegia and hemiparesis following cerebral infarction affecting right dominant side: Secondary | ICD-10-CM | POA: Diagnosis not present

## 2019-05-07 DIAGNOSIS — G250 Essential tremor: Secondary | ICD-10-CM | POA: Diagnosis not present

## 2019-05-07 DIAGNOSIS — E1140 Type 2 diabetes mellitus with diabetic neuropathy, unspecified: Secondary | ICD-10-CM | POA: Diagnosis not present

## 2019-05-07 DIAGNOSIS — M810 Age-related osteoporosis without current pathological fracture: Secondary | ICD-10-CM | POA: Diagnosis not present

## 2019-05-07 DIAGNOSIS — I69351 Hemiplegia and hemiparesis following cerebral infarction affecting right dominant side: Secondary | ICD-10-CM | POA: Diagnosis not present

## 2019-05-07 DIAGNOSIS — I739 Peripheral vascular disease, unspecified: Secondary | ICD-10-CM | POA: Diagnosis not present

## 2019-05-07 DIAGNOSIS — L89152 Pressure ulcer of sacral region, stage 2: Secondary | ICD-10-CM | POA: Diagnosis not present

## 2019-05-07 DIAGNOSIS — E538 Deficiency of other specified B group vitamins: Secondary | ICD-10-CM | POA: Diagnosis not present

## 2019-05-07 DIAGNOSIS — F0390 Unspecified dementia without behavioral disturbance: Secondary | ICD-10-CM | POA: Diagnosis not present

## 2019-05-07 DIAGNOSIS — G4700 Insomnia, unspecified: Secondary | ICD-10-CM | POA: Diagnosis not present

## 2019-05-08 DIAGNOSIS — F0390 Unspecified dementia without behavioral disturbance: Secondary | ICD-10-CM | POA: Diagnosis not present

## 2019-05-08 DIAGNOSIS — I69351 Hemiplegia and hemiparesis following cerebral infarction affecting right dominant side: Secondary | ICD-10-CM | POA: Diagnosis not present

## 2019-05-08 DIAGNOSIS — G250 Essential tremor: Secondary | ICD-10-CM | POA: Diagnosis not present

## 2019-05-08 DIAGNOSIS — M810 Age-related osteoporosis without current pathological fracture: Secondary | ICD-10-CM | POA: Diagnosis not present

## 2019-05-08 DIAGNOSIS — E1140 Type 2 diabetes mellitus with diabetic neuropathy, unspecified: Secondary | ICD-10-CM | POA: Diagnosis not present

## 2019-05-08 DIAGNOSIS — I739 Peripheral vascular disease, unspecified: Secondary | ICD-10-CM | POA: Diagnosis not present

## 2019-05-08 DIAGNOSIS — G4700 Insomnia, unspecified: Secondary | ICD-10-CM | POA: Diagnosis not present

## 2019-05-08 DIAGNOSIS — E538 Deficiency of other specified B group vitamins: Secondary | ICD-10-CM | POA: Diagnosis not present

## 2019-05-08 DIAGNOSIS — L89152 Pressure ulcer of sacral region, stage 2: Secondary | ICD-10-CM | POA: Diagnosis not present

## 2019-05-11 DIAGNOSIS — E785 Hyperlipidemia, unspecified: Secondary | ICD-10-CM | POA: Diagnosis not present

## 2019-05-11 DIAGNOSIS — Z1331 Encounter for screening for depression: Secondary | ICD-10-CM | POA: Diagnosis not present

## 2019-05-11 DIAGNOSIS — Z139 Encounter for screening, unspecified: Secondary | ICD-10-CM | POA: Diagnosis not present

## 2019-05-11 DIAGNOSIS — Z0000 Encounter for general adult medical examination without abnormal findings: Secondary | ICD-10-CM | POA: Diagnosis not present

## 2019-05-11 DIAGNOSIS — Z23 Encounter for immunization: Secondary | ICD-10-CM | POA: Diagnosis not present

## 2019-05-11 DIAGNOSIS — Z9181 History of falling: Secondary | ICD-10-CM | POA: Diagnosis not present

## 2019-05-12 DIAGNOSIS — G4700 Insomnia, unspecified: Secondary | ICD-10-CM | POA: Diagnosis not present

## 2019-05-12 DIAGNOSIS — L89152 Pressure ulcer of sacral region, stage 2: Secondary | ICD-10-CM | POA: Diagnosis not present

## 2019-05-12 DIAGNOSIS — I69351 Hemiplegia and hemiparesis following cerebral infarction affecting right dominant side: Secondary | ICD-10-CM | POA: Diagnosis not present

## 2019-05-12 DIAGNOSIS — G250 Essential tremor: Secondary | ICD-10-CM | POA: Diagnosis not present

## 2019-05-12 DIAGNOSIS — F0390 Unspecified dementia without behavioral disturbance: Secondary | ICD-10-CM | POA: Diagnosis not present

## 2019-05-12 DIAGNOSIS — I739 Peripheral vascular disease, unspecified: Secondary | ICD-10-CM | POA: Diagnosis not present

## 2019-05-12 DIAGNOSIS — E1140 Type 2 diabetes mellitus with diabetic neuropathy, unspecified: Secondary | ICD-10-CM | POA: Diagnosis not present

## 2019-05-12 DIAGNOSIS — M810 Age-related osteoporosis without current pathological fracture: Secondary | ICD-10-CM | POA: Diagnosis not present

## 2019-05-12 DIAGNOSIS — E538 Deficiency of other specified B group vitamins: Secondary | ICD-10-CM | POA: Diagnosis not present

## 2019-05-14 DIAGNOSIS — I69359 Hemiplegia and hemiparesis following cerebral infarction affecting unspecified side: Secondary | ICD-10-CM | POA: Diagnosis not present

## 2019-05-14 DIAGNOSIS — M79604 Pain in right leg: Secondary | ICD-10-CM | POA: Diagnosis not present

## 2019-05-14 DIAGNOSIS — G4700 Insomnia, unspecified: Secondary | ICD-10-CM | POA: Diagnosis not present

## 2019-05-14 DIAGNOSIS — M810 Age-related osteoporosis without current pathological fracture: Secondary | ICD-10-CM | POA: Diagnosis not present

## 2019-05-14 DIAGNOSIS — F0390 Unspecified dementia without behavioral disturbance: Secondary | ICD-10-CM | POA: Diagnosis not present

## 2019-05-14 DIAGNOSIS — E118 Type 2 diabetes mellitus with unspecified complications: Secondary | ICD-10-CM | POA: Diagnosis not present

## 2019-05-14 DIAGNOSIS — Z681 Body mass index (BMI) 19 or less, adult: Secondary | ICD-10-CM | POA: Diagnosis not present

## 2019-05-14 DIAGNOSIS — L89152 Pressure ulcer of sacral region, stage 2: Secondary | ICD-10-CM | POA: Diagnosis not present

## 2019-05-14 DIAGNOSIS — I739 Peripheral vascular disease, unspecified: Secondary | ICD-10-CM | POA: Diagnosis not present

## 2019-05-14 DIAGNOSIS — E1169 Type 2 diabetes mellitus with other specified complication: Secondary | ICD-10-CM | POA: Diagnosis not present

## 2019-05-14 DIAGNOSIS — I69351 Hemiplegia and hemiparesis following cerebral infarction affecting right dominant side: Secondary | ICD-10-CM | POA: Diagnosis not present

## 2019-05-14 DIAGNOSIS — Z794 Long term (current) use of insulin: Secondary | ICD-10-CM | POA: Diagnosis not present

## 2019-05-14 DIAGNOSIS — E7800 Pure hypercholesterolemia, unspecified: Secondary | ICD-10-CM | POA: Diagnosis not present

## 2019-05-14 DIAGNOSIS — I10 Essential (primary) hypertension: Secondary | ICD-10-CM | POA: Diagnosis not present

## 2019-05-14 DIAGNOSIS — M79605 Pain in left leg: Secondary | ICD-10-CM | POA: Diagnosis not present

## 2019-05-14 DIAGNOSIS — E538 Deficiency of other specified B group vitamins: Secondary | ICD-10-CM | POA: Diagnosis not present

## 2019-05-14 DIAGNOSIS — F324 Major depressive disorder, single episode, in partial remission: Secondary | ICD-10-CM | POA: Diagnosis not present

## 2019-05-14 DIAGNOSIS — E1140 Type 2 diabetes mellitus with diabetic neuropathy, unspecified: Secondary | ICD-10-CM | POA: Diagnosis not present

## 2019-05-14 DIAGNOSIS — G250 Essential tremor: Secondary | ICD-10-CM | POA: Diagnosis not present

## 2019-05-15 DIAGNOSIS — M810 Age-related osteoporosis without current pathological fracture: Secondary | ICD-10-CM | POA: Diagnosis not present

## 2019-05-15 DIAGNOSIS — I69351 Hemiplegia and hemiparesis following cerebral infarction affecting right dominant side: Secondary | ICD-10-CM | POA: Diagnosis not present

## 2019-05-15 DIAGNOSIS — F0390 Unspecified dementia without behavioral disturbance: Secondary | ICD-10-CM | POA: Diagnosis not present

## 2019-05-15 DIAGNOSIS — G4700 Insomnia, unspecified: Secondary | ICD-10-CM | POA: Diagnosis not present

## 2019-05-15 DIAGNOSIS — G250 Essential tremor: Secondary | ICD-10-CM | POA: Diagnosis not present

## 2019-05-15 DIAGNOSIS — E538 Deficiency of other specified B group vitamins: Secondary | ICD-10-CM | POA: Diagnosis not present

## 2019-05-15 DIAGNOSIS — E1140 Type 2 diabetes mellitus with diabetic neuropathy, unspecified: Secondary | ICD-10-CM | POA: Diagnosis not present

## 2019-05-15 DIAGNOSIS — L89152 Pressure ulcer of sacral region, stage 2: Secondary | ICD-10-CM | POA: Diagnosis not present

## 2019-05-15 DIAGNOSIS — I739 Peripheral vascular disease, unspecified: Secondary | ICD-10-CM | POA: Diagnosis not present

## 2019-05-20 DIAGNOSIS — Z7401 Bed confinement status: Secondary | ICD-10-CM | POA: Diagnosis not present

## 2019-05-20 DIAGNOSIS — R1310 Dysphagia, unspecified: Secondary | ICD-10-CM | POA: Diagnosis not present

## 2019-05-21 DIAGNOSIS — I739 Peripheral vascular disease, unspecified: Secondary | ICD-10-CM | POA: Diagnosis not present

## 2019-05-21 DIAGNOSIS — E538 Deficiency of other specified B group vitamins: Secondary | ICD-10-CM | POA: Diagnosis not present

## 2019-05-21 DIAGNOSIS — L89152 Pressure ulcer of sacral region, stage 2: Secondary | ICD-10-CM | POA: Diagnosis not present

## 2019-05-21 DIAGNOSIS — I69351 Hemiplegia and hemiparesis following cerebral infarction affecting right dominant side: Secondary | ICD-10-CM | POA: Diagnosis not present

## 2019-05-21 DIAGNOSIS — G250 Essential tremor: Secondary | ICD-10-CM | POA: Diagnosis not present

## 2019-05-21 DIAGNOSIS — G4700 Insomnia, unspecified: Secondary | ICD-10-CM | POA: Diagnosis not present

## 2019-05-21 DIAGNOSIS — E1140 Type 2 diabetes mellitus with diabetic neuropathy, unspecified: Secondary | ICD-10-CM | POA: Diagnosis not present

## 2019-05-21 DIAGNOSIS — M810 Age-related osteoporosis without current pathological fracture: Secondary | ICD-10-CM | POA: Diagnosis not present

## 2019-05-21 DIAGNOSIS — F0390 Unspecified dementia without behavioral disturbance: Secondary | ICD-10-CM | POA: Diagnosis not present

## 2019-05-28 DIAGNOSIS — G250 Essential tremor: Secondary | ICD-10-CM | POA: Diagnosis not present

## 2019-05-28 DIAGNOSIS — G4700 Insomnia, unspecified: Secondary | ICD-10-CM | POA: Diagnosis not present

## 2019-05-28 DIAGNOSIS — I69351 Hemiplegia and hemiparesis following cerebral infarction affecting right dominant side: Secondary | ICD-10-CM | POA: Diagnosis not present

## 2019-05-28 DIAGNOSIS — M810 Age-related osteoporosis without current pathological fracture: Secondary | ICD-10-CM | POA: Diagnosis not present

## 2019-05-28 DIAGNOSIS — L89152 Pressure ulcer of sacral region, stage 2: Secondary | ICD-10-CM | POA: Diagnosis not present

## 2019-05-28 DIAGNOSIS — E1140 Type 2 diabetes mellitus with diabetic neuropathy, unspecified: Secondary | ICD-10-CM | POA: Diagnosis not present

## 2019-05-28 DIAGNOSIS — E538 Deficiency of other specified B group vitamins: Secondary | ICD-10-CM | POA: Diagnosis not present

## 2019-05-28 DIAGNOSIS — I739 Peripheral vascular disease, unspecified: Secondary | ICD-10-CM | POA: Diagnosis not present

## 2019-05-28 DIAGNOSIS — F0390 Unspecified dementia without behavioral disturbance: Secondary | ICD-10-CM | POA: Diagnosis not present

## 2019-06-02 DIAGNOSIS — G250 Essential tremor: Secondary | ICD-10-CM | POA: Diagnosis not present

## 2019-06-02 DIAGNOSIS — I69351 Hemiplegia and hemiparesis following cerebral infarction affecting right dominant side: Secondary | ICD-10-CM | POA: Diagnosis not present

## 2019-06-02 DIAGNOSIS — I739 Peripheral vascular disease, unspecified: Secondary | ICD-10-CM | POA: Diagnosis not present

## 2019-06-02 DIAGNOSIS — E538 Deficiency of other specified B group vitamins: Secondary | ICD-10-CM | POA: Diagnosis not present

## 2019-06-02 DIAGNOSIS — F0390 Unspecified dementia without behavioral disturbance: Secondary | ICD-10-CM | POA: Diagnosis not present

## 2019-06-02 DIAGNOSIS — E1140 Type 2 diabetes mellitus with diabetic neuropathy, unspecified: Secondary | ICD-10-CM | POA: Diagnosis not present

## 2019-06-02 DIAGNOSIS — M810 Age-related osteoporosis without current pathological fracture: Secondary | ICD-10-CM | POA: Diagnosis not present

## 2019-06-02 DIAGNOSIS — L89152 Pressure ulcer of sacral region, stage 2: Secondary | ICD-10-CM | POA: Diagnosis not present

## 2019-06-02 DIAGNOSIS — G4700 Insomnia, unspecified: Secondary | ICD-10-CM | POA: Diagnosis not present

## 2019-06-04 ENCOUNTER — Ambulatory Visit: Payer: Medicare HMO | Admitting: Gastroenterology

## 2019-06-04 ENCOUNTER — Encounter: Payer: Self-pay | Admitting: Gastroenterology

## 2019-06-04 ENCOUNTER — Ambulatory Visit (INDEPENDENT_AMBULATORY_CARE_PROVIDER_SITE_OTHER): Payer: Medicare HMO | Admitting: Gastroenterology

## 2019-06-04 ENCOUNTER — Encounter (INDEPENDENT_AMBULATORY_CARE_PROVIDER_SITE_OTHER): Payer: Self-pay

## 2019-06-04 ENCOUNTER — Other Ambulatory Visit: Payer: Self-pay

## 2019-06-04 VITALS — BP 101/66 | HR 96 | Temp 97.9°F | Ht 66.0 in | Wt 127.0 lb

## 2019-06-04 DIAGNOSIS — R1310 Dysphagia, unspecified: Secondary | ICD-10-CM

## 2019-06-04 NOTE — Progress Notes (Signed)
Primary Care Physician: Cyndi Bender, PA-C  Primary Gastroenterologist:  Dr. Lucilla Lame  Chief Complaint  Patient presents with  . Dysphagia    HPI: Carrie Bishop is a 85 y.o. female here with a history of dysphagia.  The patient is accompanied by her daughter who states that the patient chokes after she eats quite often.  The patient had been seen by my partners in the past for iron deficiency anemia.  She is now here for the the issues with dysphagia.  The patient was seen at her care facility in the past by a speech therapist who recommended being seen by gastroenterology for possible esophageal stricture.  The patient has dementia and a essential tremor but does not have Parkinson's as reported by her family.  Current Outpatient Medications  Medication Sig Dispense Refill  . acetaminophen (TYLENOL) 325 MG tablet Take 650 mg every 4 (four) hours as needed by mouth for mild pain.    Marland Kitchen alendronate (FOSAMAX) 70 MG tablet Take 70 mg by mouth every Friday.     . carbidopa-levodopa (SINEMET IR) 25-100 MG tablet Take 2 tablets by mouth 3 (three) times daily.     . Cholecalciferol (VITAMIN D3) 2000 units TABS Take 2,000 Units by mouth daily.    Marland Kitchen donepezil (ARICEPT) 5 MG tablet Take 5 mg at bedtime by mouth.    . escitalopram (LEXAPRO) 10 MG tablet     . LANTUS SOLOSTAR 100 UNIT/ML Solostar Pen INJECT 5 TO 10 UNITS AT BEDTIME ( SLOWLY INCREASE UNTIL FASTING GLUCOSE <130  6  . lisinopril (PRINIVIL,ZESTRIL) 40 MG tablet     . metFORMIN (GLUCOPHAGE) 500 MG tablet Take 500 mg by mouth 3 (three) times daily.    . pantoprazole (PROTONIX) 40 MG tablet     . pravastatin (PRAVACHOL) 40 MG tablet Take 40 mg by mouth at bedtime.     . pregabalin (LYRICA) 150 MG capsule Take 150 mg by mouth 2 (two) times daily.    . primidone (MYSOLINE) 50 MG tablet Take 50 mg by mouth 2 (two) times daily.     . DULoxetine (CYMBALTA) 60 MG capsule Take 60 mg at bedtime by mouth.     . ferrous sulfate 325 (65 FE)  MG EC tablet Take 1 tablet (325 mg total) by mouth 2 (two) times daily. (Patient not taking: Reported on 08/26/2018) 60 tablet 3   No current facility-administered medications for this visit.     Allergies as of 06/04/2019  . (No Known Allergies)    ROS:  General: Negative for anorexia, weight loss, fever, chills, fatigue, weakness. ENT: Negative for hoarseness, difficulty swallowing , nasal congestion. CV: Negative for chest pain, angina, palpitations, dyspnea on exertion, peripheral edema.  Respiratory: Negative for dyspnea at rest, dyspnea on exertion, cough, sputum, wheezing.  GI: See history of present illness. GU:  Negative for dysuria, hematuria, urinary incontinence, urinary frequency, nocturnal urination.  Endo: Negative for unusual weight change.    Physical Examination:   BP 101/66   Pulse 96   Temp 97.9 F (36.6 C) (Temporal)   Ht 5\' 6"  (1.676 m)   Wt 127 lb (57.6 kg)   BMI 20.50 kg/m   General: Well-nourished, well-developed in no acute distress.  Eyes: No icterus. Conjunctivae pink. Lungs: Clear to auscultation bilaterally. Non-labored. Heart: Regular rate and rhythm, no murmurs rubs or gallops.  Abdomen: Bowel sounds are normal, nontender, nondistended, no hepatosplenomegaly or masses, no abdominal bruits or hernia , no rebound or guarding.  Extremities: No lower extremity edema. No clubbing or deformities. Neuro: Nonverbal with a resting tremor Skin: Warm and dry, no jaundice.   Psych: Alert   Labs:    Imaging Studies: No results found.  Assessment and Plan:   GUDIYA VARDEMAN is a 85 y.o. y/o female with a history of choking on food.  The patient was recommended to see GI for possible esophageal stricture.  The patient will be set up for a barium swallow to see if there is any narrowings in the esophagus before I subject this 85 year old woman to any endoscopic procedures.  If there is no abnormality seen in the esophagus then she should be set up for a  modified barium swallow to see if it is something related to her swallowing mechanisms.  The family has also been told that if she has aspiration on the modified barium swallow then she may need a feeding tube and that should be considered.  The patients family has been explained the plan and agrees with it.     Lucilla Lame, MD. Carrie Bishop    Note: This dictation was prepared with Dragon dictation along with smaller phrase technology. Any transcriptional errors that result from this process are unintentional.

## 2019-06-08 DIAGNOSIS — F0390 Unspecified dementia without behavioral disturbance: Secondary | ICD-10-CM | POA: Diagnosis not present

## 2019-06-08 DIAGNOSIS — I739 Peripheral vascular disease, unspecified: Secondary | ICD-10-CM | POA: Diagnosis not present

## 2019-06-08 DIAGNOSIS — M810 Age-related osteoporosis without current pathological fracture: Secondary | ICD-10-CM | POA: Diagnosis not present

## 2019-06-08 DIAGNOSIS — G250 Essential tremor: Secondary | ICD-10-CM | POA: Diagnosis not present

## 2019-06-08 DIAGNOSIS — E538 Deficiency of other specified B group vitamins: Secondary | ICD-10-CM | POA: Diagnosis not present

## 2019-06-08 DIAGNOSIS — G4700 Insomnia, unspecified: Secondary | ICD-10-CM | POA: Diagnosis not present

## 2019-06-08 DIAGNOSIS — E1140 Type 2 diabetes mellitus with diabetic neuropathy, unspecified: Secondary | ICD-10-CM | POA: Diagnosis not present

## 2019-06-08 DIAGNOSIS — L89152 Pressure ulcer of sacral region, stage 2: Secondary | ICD-10-CM | POA: Diagnosis not present

## 2019-06-08 DIAGNOSIS — I69351 Hemiplegia and hemiparesis following cerebral infarction affecting right dominant side: Secondary | ICD-10-CM | POA: Diagnosis not present

## 2019-06-09 ENCOUNTER — Other Ambulatory Visit: Payer: Self-pay

## 2019-06-09 ENCOUNTER — Ambulatory Visit
Admission: RE | Admit: 2019-06-09 | Discharge: 2019-06-09 | Disposition: A | Discharge status: Home / Self Care | Payer: Medicare HMO | Source: Ambulatory Visit | Attending: Gastroenterology | Admitting: Gastroenterology

## 2019-06-09 ENCOUNTER — Other Ambulatory Visit: Payer: Self-pay | Admitting: Gastroenterology

## 2019-06-09 DIAGNOSIS — R1310 Dysphagia, unspecified: Secondary | ICD-10-CM | POA: Diagnosis not present

## 2019-06-10 ENCOUNTER — Telehealth: Payer: Self-pay

## 2019-06-10 ENCOUNTER — Other Ambulatory Visit: Payer: Self-pay

## 2019-06-10 DIAGNOSIS — R1310 Dysphagia, unspecified: Secondary | ICD-10-CM

## 2019-06-10 NOTE — Telephone Encounter (Signed)
LVM for pt's daughter, [name] to return my call.

## 2019-06-10 NOTE — Telephone Encounter (Signed)
-----   Message from Lucilla Lame, MD sent at 06/09/2019  5:52 PM EST ----- Let the patient know that there was no narrowing or stricture in the esophagus but there was some spasms and abnormal contractions.  She should be set up for a modified barium swallow to make sure she is not having aspiration and speech pathology can also recommend what she can eat and how she can eat to better facilitate her p.o. intake.

## 2019-06-12 ENCOUNTER — Telehealth: Payer: Self-pay

## 2019-06-12 NOTE — Telephone Encounter (Signed)
-----   Message from Lucilla Lame, MD sent at 06/09/2019  5:52 PM EST ----- Let the patient know that there was no narrowing or stricture in the esophagus but there was some spasms and abnormal contractions.  She should be set up for a modified barium swallow to make sure she is not having aspiration and speech pathology can also recommend what she can eat and how she can eat to better facilitate her p.o. intake.

## 2019-06-12 NOTE — Telephone Encounter (Signed)
Pt's daughter, [name] notified of barium swallow results.   Pt has been scheduled for the modified barium swallow with speech pathologist on Monday, Nov 30th at 1:00pm. Left vm informing [name] of this information along with arrival time and prep instructions.

## 2019-06-20 DIAGNOSIS — Z7401 Bed confinement status: Secondary | ICD-10-CM | POA: Diagnosis not present

## 2019-06-20 DIAGNOSIS — R1310 Dysphagia, unspecified: Secondary | ICD-10-CM | POA: Diagnosis not present

## 2019-06-29 ENCOUNTER — Other Ambulatory Visit: Payer: Self-pay

## 2019-06-29 ENCOUNTER — Ambulatory Visit
Admission: RE | Admit: 2019-06-29 | Discharge: 2019-06-29 | Disposition: A | Discharge status: Home / Self Care | Payer: Medicare HMO | Source: Ambulatory Visit | Attending: Gastroenterology | Admitting: Gastroenterology

## 2019-06-29 DIAGNOSIS — R1312 Dysphagia, oropharyngeal phase: Secondary | ICD-10-CM | POA: Insufficient documentation

## 2019-06-29 DIAGNOSIS — R1310 Dysphagia, unspecified: Secondary | ICD-10-CM | POA: Diagnosis not present

## 2019-06-29 DIAGNOSIS — K224 Dyskinesia of esophagus: Secondary | ICD-10-CM | POA: Diagnosis not present

## 2019-06-29 NOTE — Therapy (Signed)
[address] [place], [place], [address] Phone: [phone]   Fax:     Modified Barium Swallow  Patient Details  Name: Carrie Bishop MRN: [id number] Date of Birth: 08/28/1928 No data recorded  Encounter Date: 06/29/2019  End of Session - 06/29/19 1408    Visit Number  1    Number of Visits  1    Date for SLP Re-Evaluation  06/29/19       Past Medical History:  Diagnosis Date  . Acute posthemorrhagic anemia 04/03/2016  . Anemia 10/20/2015  . Chest pain 06/17/2017  . Depression   . Diabetes mellitus without complication ([place])   . GI bleed 03/12/2018  . Hip fracture [facility])    left March 2017  . Hypertension   . Neuropathy   . Osteoporosis   . Restless leg syndrome   . Stroke ([place])   . Symptomatic anemia 03/12/2018  . Tremor     Past Surgical History:  Procedure Laterality Date  . ABDOMINAL HYSTERECTOMY    . arm surgery    . COLONOSCOPY WITH PROPOFOL N/A 03/14/2018   Procedure: COLONOSCOPY WITH PROPOFOL;  Surgeon: Lin Landsman, MD;  Location: [facility] ENDOSCOPY;  Service: Gastroenterology;  Laterality: N/A;  . ESOPHAGOGASTRODUODENOSCOPY (EGD) WITH PROPOFOL N/A 03/14/2018   Procedure: ESOPHAGOGASTRODUODENOSCOPY (EGD) WITH PROPOFOL;  Surgeon: Lin Landsman, MD;  Location: [facility] ENDOSCOPY;  Service: Gastroenterology;  Laterality: N/A;  . GIVENS CAPSULE STUDY N/A 03/15/2018   Procedure: GIVENS CAPSULE STUDY;  Surgeon: Jonathon Bellows, MD;  Location: [facility] ENDOSCOPY;  Service: Gastroenterology;  Laterality: N/A;  . INTRAMEDULLARY (IM) NAIL INTERTROCHANTERIC Left 10/21/2015   Procedure: INTRAMEDULLARY (IM) NAIL INTERTROCHANTRIC LEFT LONG AFFIXUS;  Surgeon: Hessie Knows, MD;  Location: [facility] ORS;  Service: Orthopedics;  Laterality: Left;  . INTRAMEDULLARY (IM) NAIL INTERTROCHANTERIC Right 03/31/2016   Procedure: INTRAMEDULLARY (IM) NAIL INTERTROCHANTRIC;  Surgeon: Dereck Leep, MD;  Location: [facility] ORS;   Service: Orthopedics;  Laterality: Right;  . LEG SURGERY      There were no vitals filed for this visit.  Subjective: Patient behavior: (alertness, ability to follow instructions, etc.): The patient is awake and verbally responsive.  She confused but cooperative for the study.  Chief complaint: concern for aspiration and need for education   Objective:  Radiological Procedure: A videoflouroscopic evaluation of oral-preparatory, reflex initiation, and pharyngeal phases of the swallow was performed; as well as a screening of the upper esophageal phase.  I. POSTURE: Upright in MBS chair  II. VIEW: Lateral  III. COMPENSATORY STRATEGIES: Liquid wash aids oral clearance.   IV. BOLUSES ADMINISTERED:   Thin Liquid: 4 cup rim sips   Nectar-thick Liquid: 2 moderate   Honey-thick Liquid: DNT   Puree: 2 teaspoon presentations   Mechanical Soft: 1/4 graham cracker in applesauce  V. RESULTS OF EVALUATION: A. ORAL PREPARATORY PHASE: (The lips, tongue, and velum are observed for strength and coordination)       **Overall Severity Rating: Moderate; very disorganized and prolonged oral management  B. SWALLOW INITIATION/REFLEX: (The reflex is normal if "triggered" by the time the bolus reached the base of the tongue)  **Overall Severity Rating: Mild; triggers at the valleculae  C. PHARYNGEAL PHASE: (Pharyngeal function is normal if the bolus shows rapid, smooth, and continuous transit through the pharynx and there is no pharyngeal residue after the swallow)  **Overall Severity Rating: within normal limits   D. LARYNGEAL PENETRATION: (Material entering into the laryngeal inlet/vestibule but not aspirated) TRANSIENT  laryngeal penetration  E. ASPIRATION: None  F. ESOPHAGEAL PHASE: (Screening of the upper esophagus) Patient had recent barium study showing esophageal spasm and abnormal contractions  ASSESSMENT: This 85 year old woman; with esophageal spasm and esophageal dysmotility; is  presenting with mild-moderate oropharyngeal dysphagia characterized by disorganized oral stage, delayed pharyngeal swallow initiation, and transient laryngeal penetration.   Aspects of the pharyngeal stage of swallowing including tongue base retraction, hyolaryngeal excursion, epiglottic inversion, and duration/amplitude of UES opening are within normal limits.  There is no observed pharyngeal residue or tracheal aspiration.  The patient is not at significant risk for prandial aspiration.  The patient and her daughter were provided with handouts with tips for eating with esophageal dysmotility / spasms and guidelines for an esophageal soft diet.  PLAN/RECOMMENDATIONS:  A. Diet: Regular- soften and moisten as needed for easier swallowing   B. Swallowing Precautions: Alternate liquids and solids   C. Recommended consultation to: follow up with MDs as recommended   D. Therapy recommendations: speech therapy is not indicated   E. Results and recommendations were with the patient and her daughter immediately following the study and the final report routed to the referring MD.  Oropharyngeal dysphagia - Plan: DG SWALLOW FUNC OP MEDICARE SPEECH PATH, DG SWALLOW FUNC OP MEDICARE SPEECH PATH        Problem List Patient Active Problem List   Diagnosis Date Noted  . Acquired arteriovenous malformation of small intestine 04/01/2018  . Closed fracture of radial styloid 01/25/2017  . Mixed Alzheimer's and vascular dementia ([place]) 05/23/2016  . S/P ORIF (open reduction internal fixation) fracture 04/03/2016  . Essential hypertension 04/03/2016  . Closed intertrochanteric fracture of femur ([place]) 03/30/2016  . Osteoporosis 11/07/2015  . Insomnia 11/07/2015  . Neuropathic pain 11/07/2015  . Depression 11/07/2015  . DM type 2 (diabetes mellitus, type 2) ([place]) 11/01/2015  . Intertrochanteric fracture of left hip ([place]) 10/20/2015  . Chronic renal insufficiency 10/20/2015  . Closed left hip fracture  ([place]) 10/20/2015  . Benign essential tremor 09/20/2015   Leroy Sea, MS/CCC- SLP  Lou Miner 06/29/2019, 2:09 PM  [facility] [place], [place], [address] Phone: [phone]   Fax:     Name: Carrie Bishop MRN: [id number] Date of Birth: 10/31/1928

## 2019-07-09 DIAGNOSIS — G309 Alzheimer's disease, unspecified: Secondary | ICD-10-CM | POA: Diagnosis not present

## 2019-07-09 DIAGNOSIS — G4752 REM sleep behavior disorder: Secondary | ICD-10-CM | POA: Diagnosis not present

## 2019-07-09 DIAGNOSIS — G250 Essential tremor: Secondary | ICD-10-CM | POA: Diagnosis not present

## 2019-07-09 DIAGNOSIS — F0280 Dementia in other diseases classified elsewhere without behavioral disturbance: Secondary | ICD-10-CM | POA: Diagnosis not present

## 2019-07-09 DIAGNOSIS — F0150 Vascular dementia without behavioral disturbance: Secondary | ICD-10-CM | POA: Diagnosis not present

## 2019-07-09 DIAGNOSIS — R269 Unspecified abnormalities of gait and mobility: Secondary | ICD-10-CM | POA: Diagnosis not present

## 2019-07-14 NOTE — Progress Notes (Signed)
Have the patient follow-up in the office to go over the results.

## 2019-07-20 DIAGNOSIS — Z7401 Bed confinement status: Secondary | ICD-10-CM | POA: Diagnosis not present

## 2019-07-20 DIAGNOSIS — R1310 Dysphagia, unspecified: Secondary | ICD-10-CM | POA: Diagnosis not present

## 2019-07-28 ENCOUNTER — Ambulatory Visit: Payer: Medicare HMO | Admitting: Gastroenterology

## 2019-08-20 DIAGNOSIS — Z7401 Bed confinement status: Secondary | ICD-10-CM | POA: Diagnosis not present

## 2019-08-20 DIAGNOSIS — R1310 Dysphagia, unspecified: Secondary | ICD-10-CM | POA: Diagnosis not present

## 2019-08-28 ENCOUNTER — Ambulatory Visit: Payer: Medicare HMO | Admitting: Gastroenterology

## 2019-09-10 DIAGNOSIS — F0150 Vascular dementia without behavioral disturbance: Secondary | ICD-10-CM | POA: Diagnosis not present

## 2019-09-10 DIAGNOSIS — G250 Essential tremor: Secondary | ICD-10-CM | POA: Diagnosis not present

## 2019-09-10 DIAGNOSIS — R269 Unspecified abnormalities of gait and mobility: Secondary | ICD-10-CM | POA: Diagnosis not present

## 2019-09-10 DIAGNOSIS — G4752 REM sleep behavior disorder: Secondary | ICD-10-CM | POA: Diagnosis not present

## 2019-09-10 DIAGNOSIS — F0280 Dementia in other diseases classified elsewhere without behavioral disturbance: Secondary | ICD-10-CM | POA: Diagnosis not present

## 2019-09-10 DIAGNOSIS — G309 Alzheimer's disease, unspecified: Secondary | ICD-10-CM | POA: Diagnosis not present

## 2019-09-15 ENCOUNTER — Telehealth: Payer: Self-pay | Admitting: Adult Health Nurse Practitioner

## 2019-09-15 NOTE — Telephone Encounter (Signed)
Spoke with patient's daughter [name] regarding Palliative services and all questions were answered and she was in agreement with this.  I have scheduled an In-person Consult for 09/29/19 @ 11 AM

## 2019-09-20 DIAGNOSIS — Z7401 Bed confinement status: Secondary | ICD-10-CM | POA: Diagnosis not present

## 2019-09-20 DIAGNOSIS — R1310 Dysphagia, unspecified: Secondary | ICD-10-CM | POA: Diagnosis not present

## 2019-09-21 ENCOUNTER — Telehealth: Payer: Self-pay | Admitting: Adult Health Nurse Practitioner

## 2019-09-21 NOTE — Telephone Encounter (Signed)
Rec'd message that daughter [name] had called wanting to know about possibly scheduling the Palliative consult sooner.  Called daughter back and she stated that she found a bedsore on patient's bottom yesterday morning and wanted to know what she needed to do for it and if we needed to schedule the appointment sooner.  I told daughter that I would contact our Nurse Navigator, Maxwell Caul and have her call her back regarding the bedsore and she was in agreement with this.  Notified Nurse Navigator via email.

## 2019-09-23 ENCOUNTER — Telehealth: Payer: Self-pay

## 2019-09-23 NOTE — Telephone Encounter (Signed)
Phone call placed returned to patient's daughter, [name], regarding new bed sore on patient's sacrum. Per [name], patient has a blister and has an area that is deep purple.Education provided to daughter to attempt to keep patient off of her back with goal to relieve pressure. Encouraged daughter to apply skin prep to the blister and apply zinc oxide to discolored area. Changed NP visit to Monday 09/28/2019

## 2019-09-24 ENCOUNTER — Ambulatory Visit: Payer: Medicare HMO | Admitting: Gastroenterology

## 2019-09-28 ENCOUNTER — Other Ambulatory Visit: Payer: Self-pay

## 2019-09-28 ENCOUNTER — Other Ambulatory Visit: Payer: Medicare HMO | Admitting: Adult Health Nurse Practitioner

## 2019-09-28 ENCOUNTER — Telehealth: Payer: Self-pay

## 2019-09-28 NOTE — Telephone Encounter (Signed)
Received message from patient's daughter that patient is now under care of another hospice.Confirmed this with [facility] [place].

## 2019-09-29 ENCOUNTER — Other Ambulatory Visit: Payer: Self-pay | Admitting: Adult Health Nurse Practitioner

## 2019-12-10 IMAGING — RF DG SWALLOWING FUNCTION
10 series · 13 of 24 positions shown · non-contrast
Comparison: None.

CLINICAL DATA: Dysphagia.

EXAM:
MODIFIED BARIUM SWALLOW
TECHNIQUE: Different consistencies of barium were administered orally to the
patient by the Speech Pathologist. Imaging of the pharynx was
performed in the lateral projection. The radiologist was present in
the fluoroscopy room for this study, providing personal supervision.
FLUOROSCOPY TIME:  Fluoroscopy Time:  1 minutes 18 seconds
Radiation Exposure Index (if provided by the fluoroscopic device):
4.5 mGy
Number of Acquired Spot Images: 0 full exposures

[Series 1: run · 2 of 14 frames shown (1 of 10)]
[frame 3/14]
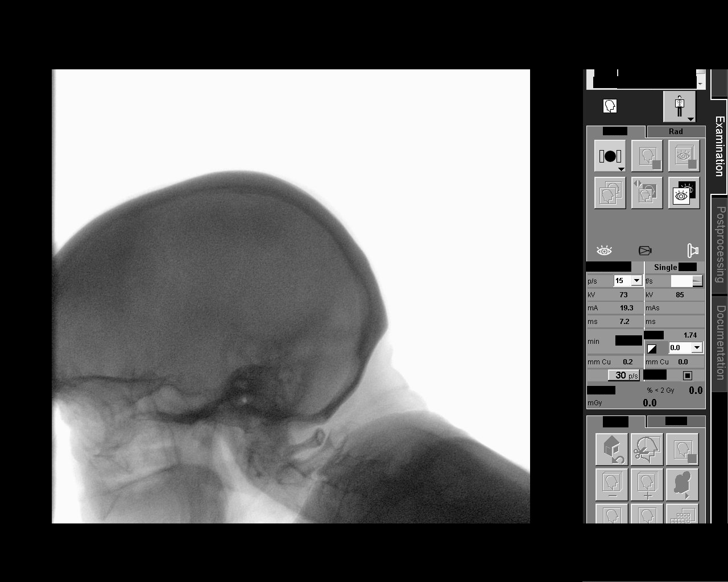
[frame 12/14]
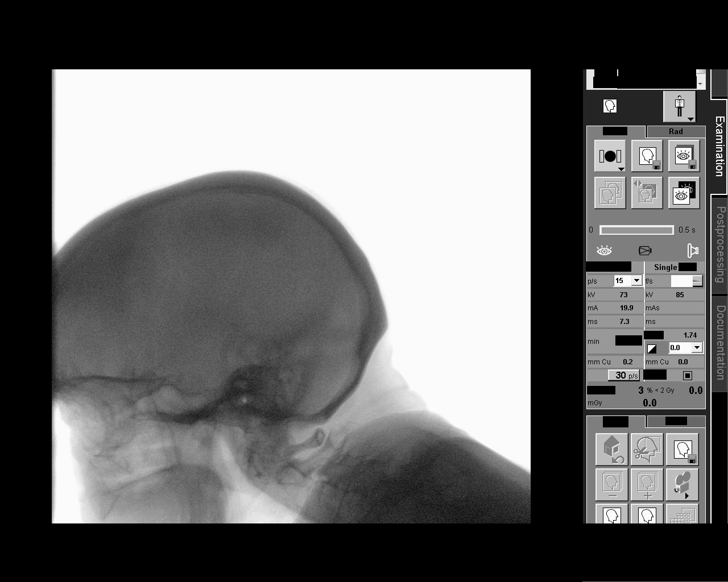

[Series 2: run · 1 of 169 frames shown (2 of 10)]
[frame 144/169]
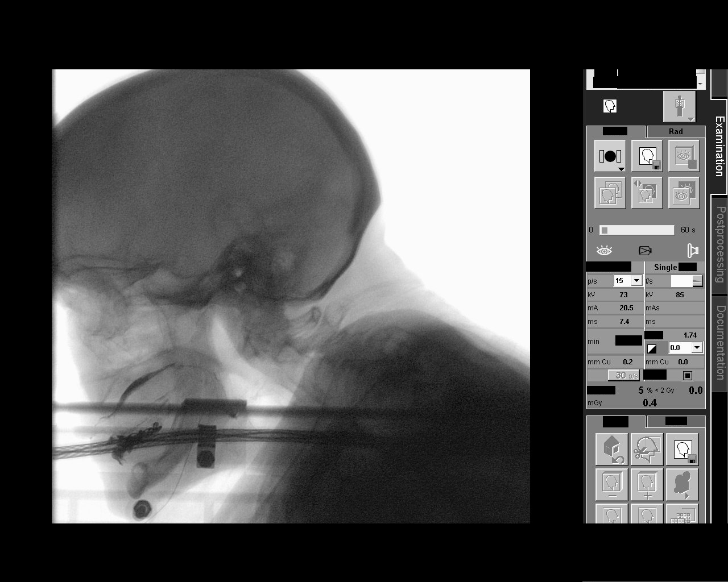

[Series 3: run · 1 of 11 frames shown (3 of 10)]
[frame 9/11]
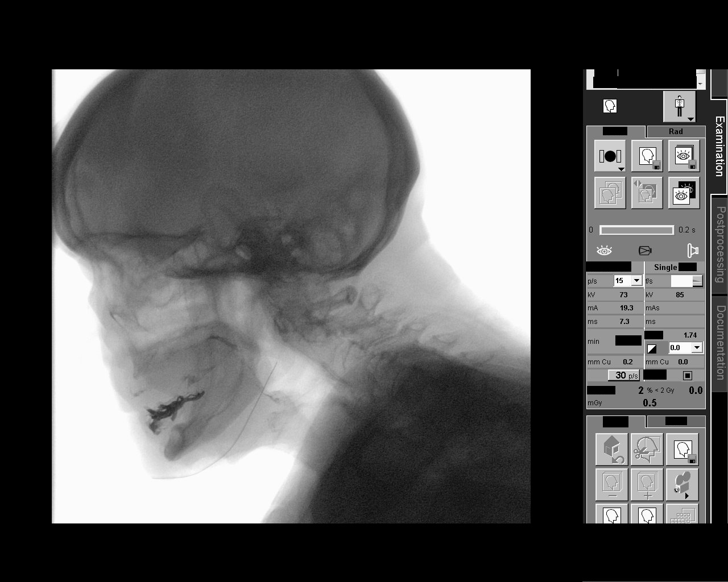

[Series 4: run · 1 of 232 frames shown (4 of 10)]
[frame 117/232]
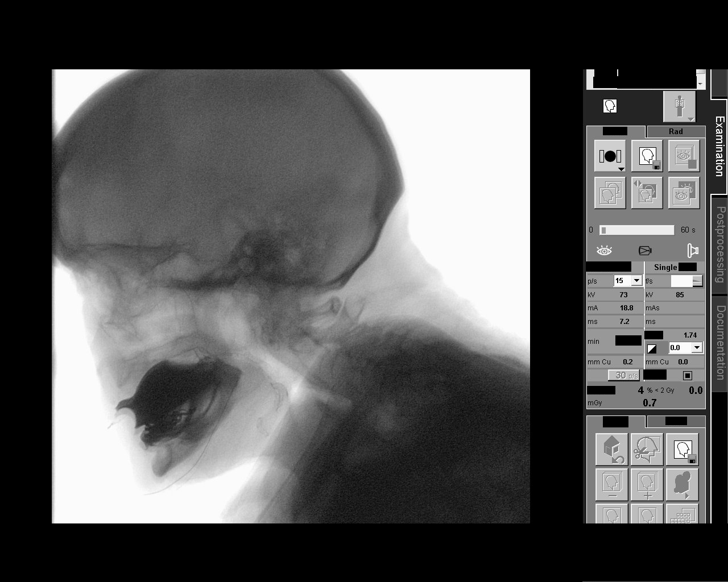

[Series 5: run · 1 of 306 frames shown (5 of 10)]
[frame 99/306]
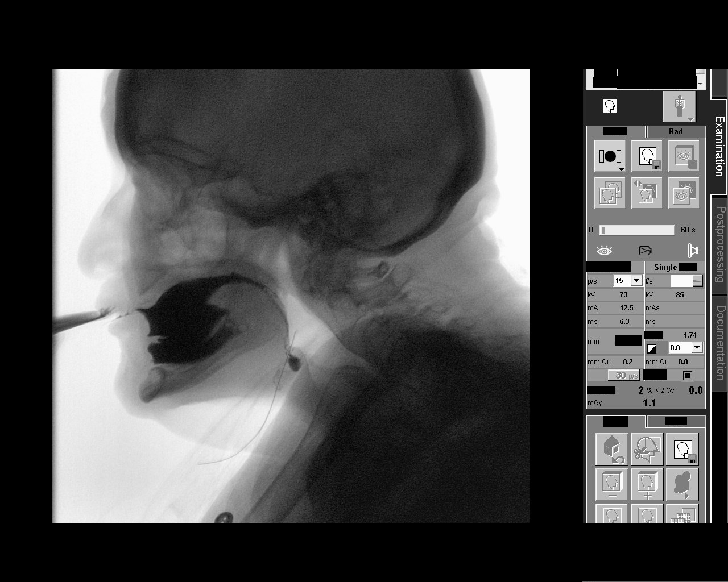

[Series 6: run · 2 of 587 frames shown (6 of 10)]
[frame 89/587]
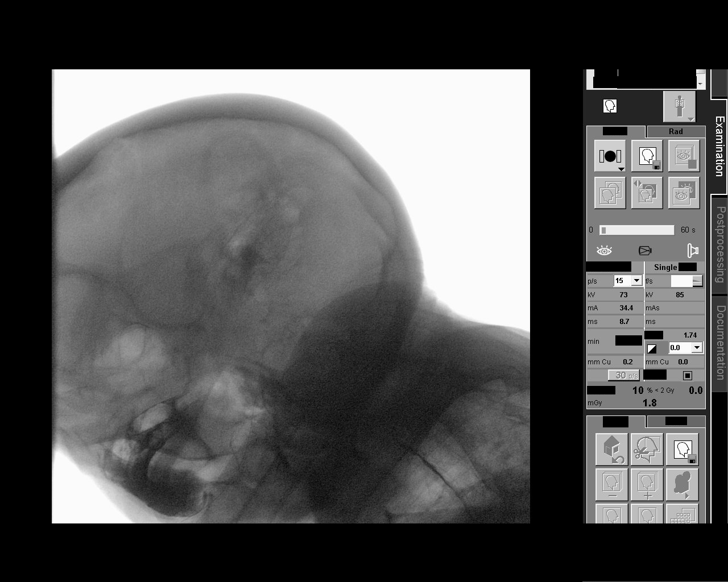
[frame 491/587]
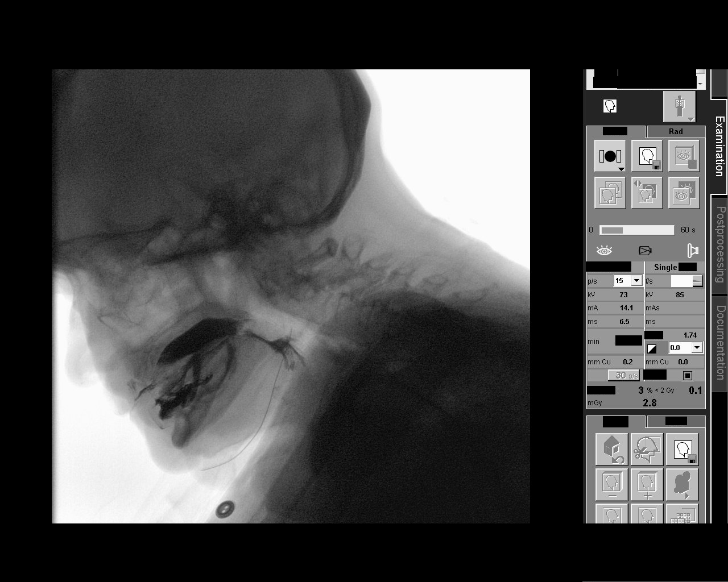

[Series 7: run · 1 of 74 frames shown (7 of 10)]
[frame 38/74]
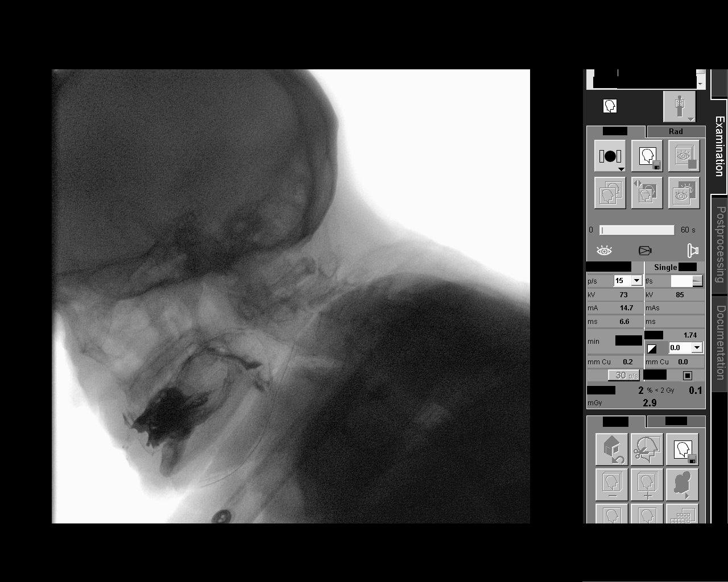

[Series 8: run · 1 of 71 frames shown (8 of 10)]
[frame 36/71]
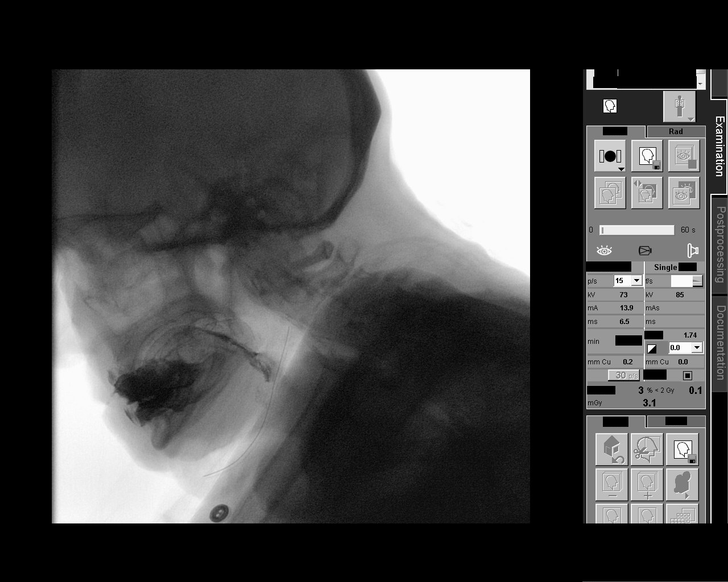

[Series 9: run · 1 of 468 frames shown (9 of 10)]
[frame 71/468]
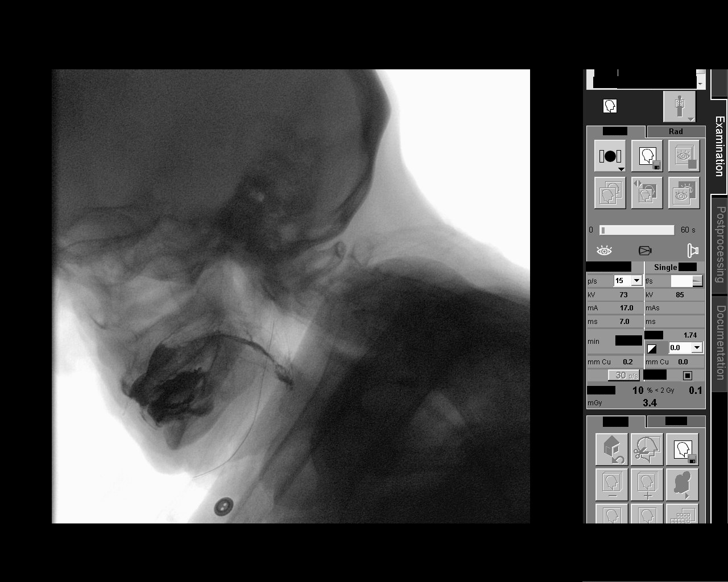

[Series 10: run · 2 of 314 frames shown (10 of 10)]
[frame 48/314]
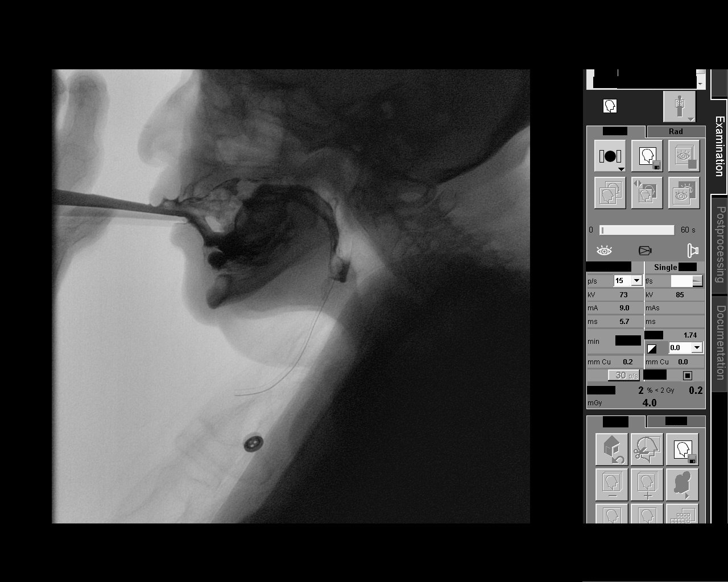
[frame 267/314]
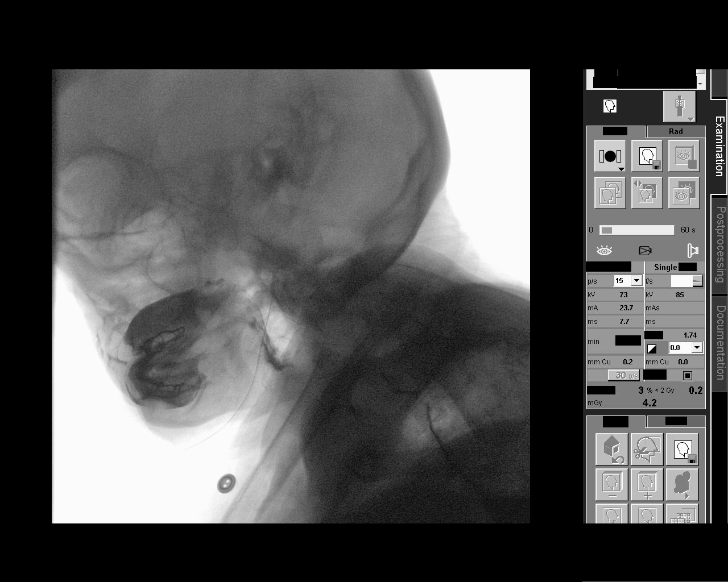

[13 of 24 positions shown; findings below may reference images not displayed]

FINDINGS: Flash penetration with thin liquids. No aspiration was evident.
IMPRESSION: Flash penetration with thin liquids. No aspiration was evident.

Please refer to the Speech Pathologists report for complete details
and recommendations.

## 2019-12-29 DEATH — deceased
